# Patient Record
Sex: Female | Born: 1955 | Race: White | Hispanic: No | Marital: Married | Smoking: Never smoker
Health system: Southern US, Community
[De-identification: ages and names within clinical notes are randomized; demographics above are authoritative.]

## PROBLEM LIST (undated history)

## (undated) ENCOUNTER — Ambulatory Visit: Source: Home / Self Care

## (undated) DIAGNOSIS — E785 Hyperlipidemia, unspecified: Secondary | ICD-10-CM

## (undated) DIAGNOSIS — K449 Diaphragmatic hernia without obstruction or gangrene: Secondary | ICD-10-CM

## (undated) DIAGNOSIS — R7303 Prediabetes: Secondary | ICD-10-CM

## (undated) DIAGNOSIS — R002 Palpitations: Secondary | ICD-10-CM

## (undated) DIAGNOSIS — R011 Cardiac murmur, unspecified: Secondary | ICD-10-CM

## (undated) DIAGNOSIS — E669 Obesity, unspecified: Secondary | ICD-10-CM

## (undated) DIAGNOSIS — E559 Vitamin D deficiency, unspecified: Secondary | ICD-10-CM

## (undated) DIAGNOSIS — M2550 Pain in unspecified joint: Secondary | ICD-10-CM

## (undated) DIAGNOSIS — M3500 Sicca syndrome, unspecified: Secondary | ICD-10-CM

## (undated) DIAGNOSIS — E538 Deficiency of other specified B group vitamins: Secondary | ICD-10-CM

## (undated) DIAGNOSIS — M1990 Unspecified osteoarthritis, unspecified site: Secondary | ICD-10-CM

## (undated) DIAGNOSIS — J45909 Unspecified asthma, uncomplicated: Secondary | ICD-10-CM

## (undated) DIAGNOSIS — R12 Heartburn: Secondary | ICD-10-CM

## (undated) DIAGNOSIS — K219 Gastro-esophageal reflux disease without esophagitis: Secondary | ICD-10-CM

## (undated) DIAGNOSIS — I10 Essential (primary) hypertension: Secondary | ICD-10-CM

## (undated) DIAGNOSIS — D649 Anemia, unspecified: Secondary | ICD-10-CM

## (undated) HISTORY — DX: Vitamin D deficiency, unspecified: E55.9

## (undated) HISTORY — DX: Cardiac murmur, unspecified: R01.1

## (undated) HISTORY — PX: INGUINAL HERNIA REPAIR: SUR1180

## (undated) HISTORY — DX: Unspecified osteoarthritis, unspecified site: M19.90

## (undated) HISTORY — DX: Hyperlipidemia, unspecified: E78.5

## (undated) HISTORY — DX: Obesity, unspecified: E66.9

## (undated) HISTORY — DX: Sjogren syndrome, unspecified: M35.00

## (undated) HISTORY — DX: Unspecified asthma, uncomplicated: J45.909

## (undated) HISTORY — PX: UPPER GASTROINTESTINAL ENDOSCOPY: SHX188

## (undated) HISTORY — DX: Deficiency of other specified B group vitamins: E53.8

## (undated) HISTORY — DX: Anemia, unspecified: D64.9

## (undated) HISTORY — DX: Pain in unspecified joint: M25.50

## (undated) HISTORY — DX: Palpitations: R00.2

## (undated) HISTORY — DX: Essential (primary) hypertension: I10

## (undated) HISTORY — DX: Prediabetes: R73.03

## (undated) HISTORY — DX: Diaphragmatic hernia without obstruction or gangrene: K44.9

## (undated) HISTORY — DX: Gastro-esophageal reflux disease without esophagitis: K21.9

## (undated) HISTORY — PX: APPENDECTOMY: SHX54

## (undated) HISTORY — PX: COLONOSCOPY: SHX174

## (undated) HISTORY — DX: Heartburn: R12

---
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
## 1999-06-04 ENCOUNTER — Other Ambulatory Visit: Admission: RE | Admit: 1999-06-04 | Discharge: 1999-06-04 | Payer: Self-pay | Admitting: Obstetrics and Gynecology

## 2000-09-15 ENCOUNTER — Other Ambulatory Visit: Admission: RE | Admit: 2000-09-15 | Discharge: 2000-09-15 | Payer: Self-pay | Admitting: Obstetrics and Gynecology

## 2001-11-17 ENCOUNTER — Other Ambulatory Visit: Admission: RE | Admit: 2001-11-17 | Discharge: 2001-11-17 | Payer: Self-pay | Admitting: Obstetrics and Gynecology

## 2002-12-07 ENCOUNTER — Other Ambulatory Visit: Admission: RE | Admit: 2002-12-07 | Discharge: 2002-12-07 | Payer: Self-pay | Admitting: Obstetrics and Gynecology

## 2002-12-29 ENCOUNTER — Ambulatory Visit (HOSPITAL_COMMUNITY): Admission: RE | Admit: 2002-12-29 | Discharge: 2002-12-29 | Payer: Self-pay | Admitting: Internal Medicine

## 2002-12-29 ENCOUNTER — Encounter: Payer: Self-pay | Admitting: Internal Medicine

## 2004-03-26 ENCOUNTER — Other Ambulatory Visit: Admission: RE | Admit: 2004-03-26 | Discharge: 2004-03-26 | Payer: Self-pay | Admitting: Obstetrics and Gynecology

## 2005-05-27 ENCOUNTER — Other Ambulatory Visit: Admission: RE | Admit: 2005-05-27 | Discharge: 2005-05-27 | Payer: Self-pay | Admitting: Obstetrics and Gynecology

## 2007-09-14 ENCOUNTER — Ambulatory Visit (HOSPITAL_COMMUNITY): Admission: RE | Admit: 2007-09-14 | Discharge: 2007-09-14 | Payer: Self-pay | Admitting: Internal Medicine

## 2011-10-01 ENCOUNTER — Other Ambulatory Visit (HOSPITAL_COMMUNITY): Payer: Self-pay | Admitting: Internal Medicine

## 2011-10-01 ENCOUNTER — Ambulatory Visit (HOSPITAL_COMMUNITY)
Admission: RE | Admit: 2011-10-01 | Discharge: 2011-10-01 | Disposition: A | Discharge status: Home / Self Care | Payer: BC Managed Care – PPO | Source: Ambulatory Visit | Attending: Internal Medicine | Admitting: Internal Medicine

## 2011-10-01 DIAGNOSIS — R0602 Shortness of breath: Secondary | ICD-10-CM

## 2011-10-01 DIAGNOSIS — M79609 Pain in unspecified limb: Secondary | ICD-10-CM

## 2011-10-01 DIAGNOSIS — M542 Cervicalgia: Secondary | ICD-10-CM | POA: Insufficient documentation

## 2011-10-01 DIAGNOSIS — M47812 Spondylosis without myelopathy or radiculopathy, cervical region: Secondary | ICD-10-CM | POA: Insufficient documentation

## 2011-10-01 DIAGNOSIS — R079 Chest pain, unspecified: Secondary | ICD-10-CM | POA: Insufficient documentation

## 2011-10-01 DIAGNOSIS — M25519 Pain in unspecified shoulder: Secondary | ICD-10-CM | POA: Insufficient documentation

## 2011-11-17 ENCOUNTER — Encounter: Payer: Self-pay | Admitting: *Deleted

## 2011-11-17 ENCOUNTER — Ambulatory Visit (INDEPENDENT_AMBULATORY_CARE_PROVIDER_SITE_OTHER): Payer: BC Managed Care – PPO | Admitting: Internal Medicine

## 2011-11-17 VITALS — BP 156/81 | HR 76 | Ht 63.0 in | Wt 223.0 lb

## 2011-11-17 DIAGNOSIS — R011 Cardiac murmur, unspecified: Secondary | ICD-10-CM

## 2011-11-17 NOTE — Progress Notes (Signed)
HPI Patient is a 56 year old who is referred fro new onset murmur. The patient was seen in May in Dr. Kathryne Sharper office.  Murmur (new) was found on exam. The patient denies CP, palpitations, SOB.  She does not exercise regularly.  Allergies  Allergen Reactions  . Lipitor (Atorvastatin)     Current Outpatient Prescriptions  Medication Sig Dispense Refill  . amphetamine-dextroamphetamine (ADDERALL) 20 MG tablet Take 20 mg by mouth daily.       Marland Kitchen aspirin 81 MG tablet Take 81 mg by mouth daily.      Marland Kitchen atenolol (TENORMIN) 25 MG tablet Take 25 mg by mouth daily.      . Cholecalciferol (VITAMIN D-3) 5000 UNITS TABS Take 1 tablet by mouth daily.      . citalopram (CELEXA) 40 MG tablet Take 40 mg by mouth 2 (two) times daily.       . CRESTOR 20 MG tablet Take 10 mg by mouth daily.       . Loratadine (CLARITIN PO) Take by mouth as needed.        Past Medical History  Diagnosis Date  . Hypertension   . Hyperlipidemia   . Anxiety   . Allergic rhinitis   . Murmur   . Obesity     No past surgical history on file.  Family History  Problem Relation Age of Onset  . Hypertension      History   Social History  . Marital Status: Married    Spouse Name: N/A    Number of Children: N/A  . Years of Education: N/A   Occupational History  . Not on file.   Social History Main Topics  . Smoking status: Never Smoker   . Smokeless tobacco: Not on file  . Alcohol Use: No  . Drug Use: No  . Sexually Active: Not on file   Other Topics Concern  . Not on file   Social History Narrative  . No narrative on file    Review of Systems:  All systems reviewed.  They are negative to the above problem except as previously stated.  Vital Signs: BP 156/81  Pulse 76  Ht 5\' 3"  (1.6 m)  Wt 223 lb (101.152 kg)  BMI 39.50 kg/m2  Physical Exam Patient is in NAD HEENT:  Normocephalic, atraumatic. EOMI, PERRLA.  Neck: JVP is normal. No thyromegaly. No bruits.  Lungs: clear to auscultation. No  rales no wheezes.  Heart: Regular rate and rhythm. Normal S1, S2. No S3.   Gr I/VI systolic murmur at base. PMI not displaced.  Abdomen:  Supple, nontender. Normal bowel sounds. No masses. No hepatomegaly.  Extremities:   Good distal pulses throughout. No lower extremity edema.  Musculoskeletal :moving all extremities.  Neuro:   alert and oriented x3.  CN II-XII grossly intact.  EKG:  NSR  76 bpm Assessment and Plan:  1.  Murmur.  Patient has a very subtle murmur at base that is most likely due to flow through LVOT  I do not think it is signif or that it represents signif valve pathology.  May have been more prominent on last exam if she was hyperdynamic F/U as neeeded.  No further testing  Reassured patient  2.  HTN  BP is high today.  She admits to being anxious  BP better in Dr. Kathryne Sharper office.  Follow  3.  HL.  LDL is 117 on Crestor.  Encouraged her to watch diet more and lose wt.  4.  Obesity.  Encouragede her to lose wt.  She is just starting wt watchers.  Encouraged waling  F/u PRN.

## 2011-11-18 ENCOUNTER — Encounter: Payer: Self-pay | Admitting: Internal Medicine

## 2011-11-29 ENCOUNTER — Ambulatory Visit (INDEPENDENT_AMBULATORY_CARE_PROVIDER_SITE_OTHER): Payer: BC Managed Care – PPO | Admitting: Emergency Medicine

## 2011-11-29 VITALS — BP 135/73 | HR 62 | Temp 97.8°F | Resp 16 | Ht 63.0 in | Wt 220.0 lb

## 2011-11-29 DIAGNOSIS — J039 Acute tonsillitis, unspecified: Secondary | ICD-10-CM

## 2011-11-29 MED ORDER — AMOXICILLIN 875 MG PO TABS: 875.0000 mg | ORAL_TABLET | Freq: Two times a day (BID) | ORAL | Status: DC

## 2011-11-29 MED ORDER — AMOXICILLIN 875 MG PO TABS: 875.0000 mg | ORAL_TABLET | Freq: Two times a day (BID) | ORAL | Status: AC

## 2011-11-29 NOTE — Progress Notes (Signed)
    Patient Name: Whitney Herrera Date of Birth: 1955-07-09 Medical Record Number: [id number] Gender: female Date of Encounter: 11/29/2011  Chief Complaint: Sore Throat   History of Present Illness:  Whitney SOTTO is a 56 y.o. very pleasant female patient who presents with the following:  Sore throat with exudate on right tonsil.  No fever or chills, nausea or vomiting.  Moderate post nasal drainage.  Some nonproductive cough   There is no problem list on file for this patient.  Past Medical History  Diagnosis Date  . Hypertension   . Hyperlipidemia   . Anxiety   . Allergic rhinitis   . Murmur   . Obesity    No past surgical history on file. History  Substance Use Topics  . Smoking status: Never Smoker   . Smokeless tobacco: Not on file  . Alcohol Use: No   Family History  Problem Relation Age of Onset  . Hypertension     Allergies  Allergen Reactions  . Lipitor (Atorvastatin)     Medication list has been reviewed and updated.  Current Outpatient Prescriptions on File Prior to Visit  Medication Sig Dispense Refill  . amphetamine-dextroamphetamine (ADDERALL) 20 MG tablet Take 20 mg by mouth daily.       Marland Kitchen aspirin 81 MG tablet Take 81 mg by mouth daily.      Marland Kitchen atenolol (TENORMIN) 25 MG tablet Take 25 mg by mouth daily.      . Cholecalciferol (VITAMIN D-3) 5000 UNITS TABS Take 1 tablet by mouth daily.      . citalopram (CELEXA) 40 MG tablet Take 40 mg by mouth 2 (two) times daily.       . CRESTOR 20 MG tablet Take 10 mg by mouth daily.       . Loratadine (CLARITIN PO) Take by mouth as needed.        Review of Systems:. As per HPI, otherwise negative.    Physical Examination: Filed Vitals:   11/29/11 1338  BP: 135/73  Pulse: 62  Temp: 97.8 F (36.6 C)  Resp: 16   Filed Vitals:   11/29/11 1338  Height: 5\' 3"  (1.6 m)  Weight: 220 lb (99.791 kg)   Body mass index is 38.97 kg/(m^2). Ideal Body Weight: Weight in (lb) to have BMI = 25: 140.8   GEN:  WDWN, NAD, Non-toxic, A & O x 3 HEENT: Atraumatic, Normocephalic. Neck supple. No masses, Right anterior cervical nodes.  Right tonsil covered with purulent smelly exudate both tonsils enlarged and red. Ears and Nose: No external deformity. CV: RRR, No M/G/R. No JVD. No thrill. No extra heart sounds. PULM: CTA B, no wheezes, crackles, rhonchi. No retractions. No resp. distress. No accessory muscle use. ABD: S, NT, ND, +BS. No rebound. No HSM. EXTR: No c/c/e NEURO Normal gait.  PSYCH: Normally interactive. Conversant. Not depressed or anxious appearing.  Calm demeanor.    EKG / Labs / Xrays: None available at time of encounter  Assessment and Plan: Tonsillitis   Carmelina Dane, MD

## 2012-11-10 ENCOUNTER — Other Ambulatory Visit: Payer: Self-pay | Admitting: Obstetrics and Gynecology

## 2012-11-10 DIAGNOSIS — R928 Other abnormal and inconclusive findings on diagnostic imaging of breast: Secondary | ICD-10-CM

## 2012-11-24 ENCOUNTER — Other Ambulatory Visit: Payer: BC Managed Care – PPO

## 2012-11-29 ENCOUNTER — Ambulatory Visit
Admission: RE | Admit: 2012-11-29 | Discharge: 2012-11-29 | Disposition: A | Discharge status: Home / Self Care | Payer: BC Managed Care – PPO | Source: Ambulatory Visit | Attending: Obstetrics and Gynecology | Admitting: Obstetrics and Gynecology

## 2012-11-29 DIAGNOSIS — R928 Other abnormal and inconclusive findings on diagnostic imaging of breast: Secondary | ICD-10-CM

## 2013-04-11 ENCOUNTER — Telehealth: Payer: Self-pay | Admitting: Internal Medicine

## 2013-04-11 NOTE — Telephone Encounter (Signed)
lvom for pt to return call in re to referral.  °

## 2013-04-17 ENCOUNTER — Encounter: Payer: Self-pay | Admitting: Physician Assistant

## 2013-04-17 DIAGNOSIS — E538 Deficiency of other specified B group vitamins: Secondary | ICD-10-CM | POA: Insufficient documentation

## 2013-04-17 DIAGNOSIS — E782 Mixed hyperlipidemia: Secondary | ICD-10-CM | POA: Insufficient documentation

## 2013-04-17 DIAGNOSIS — E785 Hyperlipidemia, unspecified: Secondary | ICD-10-CM | POA: Insufficient documentation

## 2013-04-17 DIAGNOSIS — E559 Vitamin D deficiency, unspecified: Secondary | ICD-10-CM | POA: Insufficient documentation

## 2013-04-17 DIAGNOSIS — I10 Essential (primary) hypertension: Secondary | ICD-10-CM | POA: Insufficient documentation

## 2013-04-20 ENCOUNTER — Ambulatory Visit: Payer: BC Managed Care – PPO | Admitting: Physician Assistant

## 2013-04-20 ENCOUNTER — Encounter: Payer: Self-pay | Admitting: Physician Assistant

## 2013-04-20 VITALS — BP 120/72 | HR 84 | Temp 98.1°F | Resp 16 | Ht 63.75 in | Wt 222.0 lb

## 2013-04-20 DIAGNOSIS — E559 Vitamin D deficiency, unspecified: Secondary | ICD-10-CM

## 2013-04-20 DIAGNOSIS — I10 Essential (primary) hypertension: Secondary | ICD-10-CM

## 2013-04-20 DIAGNOSIS — R7303 Prediabetes: Secondary | ICD-10-CM

## 2013-04-20 DIAGNOSIS — E785 Hyperlipidemia, unspecified: Secondary | ICD-10-CM

## 2013-04-20 DIAGNOSIS — F329 Major depressive disorder, single episode, unspecified: Secondary | ICD-10-CM

## 2013-04-20 DIAGNOSIS — R7309 Other abnormal glucose: Secondary | ICD-10-CM | POA: Insufficient documentation

## 2013-04-20 LAB — HEPATIC FUNCTION PANEL
ALT: 12 U/L (ref 0–35)
AST: 14 U/L (ref 0–37)
Alkaline Phosphatase: 75 U/L (ref 39–117)
Bilirubin, Direct: 0.1 mg/dL (ref 0.0–0.3)
Total Bilirubin: 0.3 mg/dL (ref 0.3–1.2)

## 2013-04-20 LAB — CBC WITH DIFFERENTIAL/PLATELET
Eosinophils Absolute: 0.2 10*3/uL (ref 0.0–0.7)
Eosinophils Relative: 3 % (ref 0–5)
Hemoglobin: 14.4 g/dL (ref 12.0–15.0)
Lymphocytes Relative: 29 % (ref 12–46)
Lymphs Abs: 2.5 10*3/uL (ref 0.7–4.0)
MCH: 29.6 pg (ref 26.0–34.0)
MCV: 88.7 fL (ref 78.0–100.0)
Monocytes Relative: 5 % (ref 3–12)
Neutrophils Relative %: 62 % (ref 43–77)
RBC: 4.86 MIL/uL (ref 3.87–5.11)
WBC: 8.7 10*3/uL (ref 4.0–10.5)

## 2013-04-20 LAB — BASIC METABOLIC PANEL WITH GFR
CO2: 28 mEq/L (ref 19–32)
Calcium: 9.8 mg/dL (ref 8.4–10.5)
Chloride: 103 mEq/L (ref 96–112)
Glucose, Bld: 93 mg/dL (ref 70–99)
Sodium: 141 mEq/L (ref 135–145)

## 2013-04-20 LAB — HEMOGLOBIN A1C
Hgb A1c MFr Bld: 5.9 % — ABNORMAL HIGH (ref <5.7)
Mean Plasma Glucose: 123 mg/dL — ABNORMAL HIGH (ref <117)

## 2013-04-20 LAB — LIPID PANEL
Cholesterol: 162 mg/dL (ref 0–200)
HDL: 48 mg/dL (ref >39)
LDL Cholesterol: 91 mg/dL (ref 0–99)
Triglycerides: 113 mg/dL (ref <150)
VLDL: 23 mg/dL (ref 0–40)

## 2013-04-20 LAB — TSH: TSH: 2.001 u[IU]/mL (ref 0.350–4.500)

## 2013-04-20 MED ORDER — ESCITALOPRAM OXALATE 20 MG PO TABS
20.0000 mg | ORAL_TABLET | Freq: Every day | ORAL | Status: DC
Start: 2013-04-20 — End: 2013-08-29

## 2013-04-20 MED ORDER — AMPHETAMINE-DEXTROAMPHETAMINE 20 MG PO TABS: 20.0000 mg | ORAL_TABLET | Freq: Every day | ORAL | Status: DC

## 2013-04-20 NOTE — Progress Notes (Signed)
HPI Patient presents for 3 month follow up with hypertension, hyperlipidemia, prediabetes and vitamin D. Patient's blood pressure has been controlled at home. Patient denies chest pain, shortness of breath, dizziness.  Patient's cholesterol is diet controlled. In addition they are on crestor 20 and denies myalgias. The cholesterol last visit was  HDL 46, LDL 113. The patient has been working on diet and exercise for prediabetes, and denies changes in vision, polys, and paresthesias.  A1C was 5.5 with an elevated insulin.  Patient states that work and home life has been stressful. Feels she is worthless at work and states that she does not do anything at home. States that she does not do well with Wellbutrin, has taken lexapro in the past.  Patient is on Vitamin D supplement. Vitamin D 61. Seeing Dr. Dierdre Forth for Sjogren's and started Plaquenil.  Current Medications:  Current Outpatient Prescriptions on File Prior to Visit  Medication Sig Dispense Refill  . Cholecalciferol (VITAMIN D-3) 5000 UNITS TABS Take 1 tablet by mouth daily. 4000 IU      . amphetamine-dextroamphetamine (ADDERALL) 20 MG tablet Take 20 mg by mouth daily.       Marland Kitchen aspirin 81 MG tablet Take 81 mg by mouth daily.      Marland Kitchen atenolol (TENORMIN) 25 MG tablet Take 25 mg by mouth daily.      . citalopram (CELEXA) 40 MG tablet Take 40 mg by mouth 2 (two) times daily.       . CRESTOR 20 MG tablet Take 10 mg by mouth daily.       . Loratadine (CLARITIN PO) Take by mouth as needed.       No current facility-administered medications on file prior to visit.   Medical History:  Past Medical History  Diagnosis Date  . Hypertension   . Hyperlipidemia   . Anxiety   . Allergic rhinitis   . Murmur   . Obesity   . B12 deficiency   . Vitamin D deficiency   . Sjogren's disease     Dr. Dierdre Forth  . Prediabetes    Allergies:  Allergies  Allergen Reactions  . Lipitor [Atorvastatin]   . Wellbutrin [Bupropion]     dysphoria     ROS Constitutional: Denies fever, chills, weight loss/gain, headaches, insomnia, fatigue, night sweats, and change in appetite. Eyes: Dry eyes and mouth better with plaquenil. Denies redness, blurred vision, diplopia, discharge, itchy, watery eyes.  ENT: Denies discharge, congestion, post nasal drip, sore throat, earache, dental pain, Tinnitus, Vertigo, Sinus pain, snoring.  Cardio: Denies chest pain, palpitations, irregular heartbeat,  dyspnea, diaphoresis, orthopnea, PND, claudication, edema Respiratory: denies cough, dyspnea,pleurisy, hoarseness, wheezing.  Gastrointestinal: Denies dysphagia, heartburn,  water brash, pain, cramps, nausea, vomiting, bloating, diarrhea, constipation, hematemesis, melena, hematochezia,  hemorrhoids Genitourinary: Denies dysuria, frequency, urgency, nocturia, hesitancy, discharge, hematuria, flank pain Musculoskeletal: Denies arthralgia, myalgia, stiffness, Jt. Swelling, pain, limp, and strain/sprain. Skin: Denies pruritis, rash, hives, warts, acne, eczema, changing in skin lesion Neuro: Weakness, tremor, incoordination, spasms, paresthesia, pain Psychiatric: + stress and anxiety at work denies confusion, memory loss, sensory loss Endocrine: Denies change in weight, skin, hair change, nocturia, and paresthesia, Diabetic Polys, visual blurring, hyper /hypo glycemic episodes.  Heme/Lymph: Excessive bleeding, bruising, enlarged lymph nodes  Family history- Review and unchanged Social history- Review and unchanged Physical Exam: Filed Vitals:   04/20/13 0854  BP: 120/72  Pulse: 84  Temp: 98.1 F (36.7 C)  Resp: 16   Filed Weights   04/20/13 0854  Weight: 222  lb (100.699 kg)   General Appearance: Well nourished, tearful.  Eyes: PERRLA, EOMs, conjunctiva no swelling or erythema, normal fundi and vessels. Sinuses: No Frontal/maxillary tenderness ENT/Mouth: Ext aud canals clear, with TMs without erythema, bulging.No erythema, swelling, or exudate on post  pharynx.  Tonsils not swollen or erythematous. Hearing normal.  Neck: Supple, thyroid normal.  Respiratory: Respiratory effort normal, BS equal bilaterally without rales, rhonchi, wheezing or stridor.  Cardio: Heart sounds normal, regular rate and rhythm without murmurs, rubs or gallops. Peripheral pulses brisk and equal bilaterally, without edema.  Abdomen: obese, soft, with bowel sounds. Non tender, no guarding, rebound, hernias, masses, or organomegaly.  Lymphatics: Non tender without lymphadenopathy.  Musculoskeletal: Full ROM all peripheral extremities, joint stability, 5/5 strength, and normal gait. Skin: Warm, dry without rashes, lesions, ecchymosis.  Neuro: Cranial nerves intact, reflexes equal bilaterally. Normal muscle tone, no cerebellar symptoms. Sensation intact.  Psych: Awake and oriented X 3, normal affect, Insight and Judgment appropriate.   Assessment and Plan:  Hypertension: Continue medication, monitor blood pressure at home. Continue DASH diet. Cholesterol: Continue diet and exercise. Check cholesterol.  Pre-diabetes-Continue diet and exercise. Check A1C Vitamin D Def- check level and continue medications.  Depression/anxiety- stop citalopram and start lexapro 20  ADD- Continue Adderall 20 BID #60 NR may discuss switching to extended release when she comes back in 1 month.  Quentin Mulling 9:10 AM

## 2013-04-21 LAB — INSULIN, FASTING: Insulin fasting, serum: 23 u[IU]/mL (ref 3–28)

## 2013-04-25 ENCOUNTER — Telehealth: Payer: Self-pay | Admitting: Internal Medicine

## 2013-04-25 NOTE — Telephone Encounter (Signed)
Pt called and gve np appt 12/09 @ 1:30 w/Dr. Arbutus Ped Referring Dr. Dierdre Forth Dx-SPEP Elevation Welcome packet mailed.

## 2013-04-25 NOTE — Telephone Encounter (Signed)
C/D 04/25/13 for appt. 05/10/13

## 2013-05-10 ENCOUNTER — Encounter: Payer: Self-pay | Admitting: Internal Medicine

## 2013-05-10 ENCOUNTER — Ambulatory Visit (HOSPITAL_BASED_OUTPATIENT_CLINIC_OR_DEPARTMENT_OTHER): Payer: BC Managed Care – PPO | Admitting: Internal Medicine

## 2013-05-10 ENCOUNTER — Telehealth: Payer: Self-pay | Admitting: Internal Medicine

## 2013-05-10 ENCOUNTER — Other Ambulatory Visit (HOSPITAL_BASED_OUTPATIENT_CLINIC_OR_DEPARTMENT_OTHER): Payer: BC Managed Care – PPO | Admitting: Lab

## 2013-05-10 ENCOUNTER — Ambulatory Visit (HOSPITAL_BASED_OUTPATIENT_CLINIC_OR_DEPARTMENT_OTHER): Payer: BC Managed Care – PPO | Admitting: Lab

## 2013-05-10 ENCOUNTER — Ambulatory Visit: Payer: BC Managed Care – PPO

## 2013-05-10 VITALS — BP 135/49 | HR 76 | Temp 98.7°F | Resp 18 | Ht 63.0 in | Wt 221.5 lb

## 2013-05-10 DIAGNOSIS — D472 Monoclonal gammopathy: Secondary | ICD-10-CM

## 2013-05-10 DIAGNOSIS — I10 Essential (primary) hypertension: Secondary | ICD-10-CM

## 2013-05-10 DIAGNOSIS — M3500 Sicca syndrome, unspecified: Secondary | ICD-10-CM

## 2013-05-10 LAB — CBC WITH DIFFERENTIAL/PLATELET
BASO%: 1 % (ref 0.0–2.0)
Basophils Absolute: 0.1 10*3/uL (ref 0.0–0.1)
EOS%: 4.3 % (ref 0.0–7.0)
HCT: 41.4 % (ref 34.8–46.6)
LYMPH%: 27.8 % (ref 14.0–49.7)
MCH: 29.6 pg (ref 25.1–34.0)
MCHC: 33.1 g/dL (ref 31.5–36.0)
MCV: 89.3 fL (ref 79.5–101.0)
MONO#: 0.6 10*3/uL (ref 0.1–0.9)
MONO%: 8.9 % (ref 0.0–14.0)
NEUT%: 58 % (ref 38.4–76.8)
RDW: 13.6 % (ref 11.2–14.5)
WBC: 6.2 10*3/uL (ref 3.9–10.3)
lymph#: 1.7 10*3/uL (ref 0.9–3.3)

## 2013-05-10 LAB — COMPREHENSIVE METABOLIC PANEL (CC13)
ALT: 18 U/L (ref 0–55)
AST: 17 U/L (ref 5–34)
Alkaline Phosphatase: 73 U/L (ref 40–150)
Anion Gap: 13 mEq/L — ABNORMAL HIGH (ref 3–11)
BUN: 15 mg/dL (ref 7.0–26.0)
CO2: 24 mEq/L (ref 22–29)
Chloride: 105 mEq/L (ref 98–109)
Creatinine: 0.8 mg/dL (ref 0.6–1.1)
Glucose: 87 mg/dl (ref 70–140)
Total Bilirubin: 0.33 mg/dL (ref 0.20–1.20)

## 2013-05-10 NOTE — Patient Instructions (Signed)
Followup visit in 3 weeks for evaluation and discussion of the pending lab results.

## 2013-05-10 NOTE — Telephone Encounter (Signed)
Gave pt appt for MD only, labs will be scheduled later, sent pt to labs today

## 2013-05-10 NOTE — Progress Notes (Signed)
Checked in new pt with no financial concerns. °

## 2013-05-10 NOTE — Progress Notes (Signed)
[facility] [phone]   [phone]  CONSULT NOTE  REFERRING PHYSICIAN: Dr. Alben Deeds  REASON FOR CONSULTATION:  57 years old white female with M spike.  HPI Whitney Herrera is a 57 y.o. female was past medical history significant for hypertension, dyslipidemia, anxiety, obesity, vitamin D deficiency and recently diagnosed Sjogren syndrome. The patient was complaining of dry mouth and she saw her primary care physician Dr. Anne Fu who sent her to Dr. Dierdre Forth for further evaluation. She was diagnosed with Sjogren syndrome. During her evaluation serum protein electrophoreses was performed and it showed M spike of 0.2 G./DL.  Dr. Dierdre Forth referred the patient to me today for further evaluation and recommendation regarding this abnormality. The patient is feeling fine and she has no significant complaints today except for the dry mouth daily. She denied having any history of renal dysfunction or anemia. She has no significant weight loss or night sweats. The patient denied having any chest pain, shortness of breath, cough or hemoptysis. She has no nausea or vomiting. She denied having any bony aches. Her family history significant for Alzheimer's in both mother and father The patient is married and has 2 children. She works in Public librarian. She has no history of smoking or drug abuse but she drinks alcohol occasionally. HPI  Past Medical History  Diagnosis Date  . Hypertension   . Hyperlipidemia   . Anxiety   . Allergic rhinitis   . Murmur   . Obesity   . B12 deficiency   . Vitamin D deficiency   . Sjogren's disease     Dr. Dierdre Forth  . Prediabetes     Past Surgical History  Procedure Laterality Date  . Breast biopsy Right 1975  . Appendectomy    . Inguinal hernia repair Bilateral age 4    Family History  Problem Relation Age of Onset  . Hypertension Mother   . Alzheimer's disease Mother   . Hypertension Father   .  Alzheimer's disease Father     Social History History  Substance Use Topics  . Smoking status: Never Smoker   . Smokeless tobacco: Never Used  . Alcohol Use: No    Allergies  Allergen Reactions  . Lipitor [Atorvastatin]   . Wellbutrin [Bupropion]     dysphoria    Current Outpatient Prescriptions  Medication Sig Dispense Refill  . amphetamine-dextroamphetamine (ADDERALL) 20 MG tablet Take 1 tablet (20 mg total) by mouth daily.  60 tablet  0  . aspirin 81 MG tablet Take 81 mg by mouth daily.      Marland Kitchen atenolol (TENORMIN) 25 MG tablet Take 25 mg by mouth daily.      . cevimeline (EVOXAC) 30 MG capsule Take 30 mg by mouth 3 (three) times daily. PRN      . Cholecalciferol (VITAMIN D-3) 5000 UNITS TABS Take 1 tablet by mouth daily. 4000 IU      . CRESTOR 20 MG tablet Take 10 mg by mouth daily.       Marland Kitchen escitalopram (LEXAPRO) 20 MG tablet Take 1 tablet (20 mg total) by mouth daily.  30 tablet  3  . hydroxychloroquine (PLAQUENIL) 200 MG tablet Take 200 mg by mouth daily.       No current facility-administered medications for this visit.    Review of Systems  Constitutional: negative Eyes: negative Ears, nose, mouth, throat, and face: positive for dry mouth Respiratory: negative Cardiovascular: negative Gastrointestinal: negative Genitourinary:negative Integument/breast: negative Hematologic/lymphatic: negative Musculoskeletal:negative  Neurological: negative Behavioral/Psych: negative Endocrine: negative Allergic/Immunologic: negative  Physical Exam  WUJ:WJXBJ, healthy, no distress, well nourished and well developed SKIN: skin color, texture, turgor are normal, no rashes or significant lesions HEAD: Normocephalic, No masses, lesions, tenderness or abnormalities EYES: normal, PERRLA EARS: External ears normal, Canals clear OROPHARYNX:no exudate, no erythema and lips, buccal mucosa, and tongue normal  NECK: supple, no adenopathy, no JVD LYMPH:  no palpable lymphadenopathy,  no hepatosplenomegaly BREAST:not examined LUNGS: clear to auscultation , and palpation HEART: regular rate & rhythm and no murmurs ABDOMEN:abdomen soft, non-tender, obese, normal bowel sounds and no masses or organomegaly BACK: Back symmetric, no curvature., No CVA tenderness EXTREMITIES:no joint deformities, effusion, or inflammation, no edema, no skin discoloration  NEURO: alert & oriented x 3 with fluent speech, no focal motor/sensory deficits  PERFORMANCE STATUS: ECOG 0  LABORATORY DATA: Lab Results  Component Value Date   WBC 8.7 04/20/2013   HGB 14.4 04/20/2013   HCT 43.1 04/20/2013   MCV 88.7 04/20/2013   PLT 240 04/20/2013      Chemistry      Component Value Date/Time   NA 141 04/20/2013 0916   K 4.3 04/20/2013 0916   CL 103 04/20/2013 0916   CO2 28 04/20/2013 0916   BUN 15 04/20/2013 0916   CREATININE 0.76 04/20/2013 0916      Component Value Date/Time   CALCIUM 9.8 04/20/2013 0916   ALKPHOS 75 04/20/2013 0916   AST 14 04/20/2013 0916   ALT 12 04/20/2013 0916   BILITOT 0.3 04/20/2013 0916       RADIOGRAPHIC STUDIES: No results found.  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 The patient was recently diagnosed wit Sjogren syndrome. The patient edematous but could be related to her rheumatologic disorder but I cannot rule out monoclonal gammopathy or multiple myeloma at this point.  PLAN: I have a lengthy discussion with the patient today about her condition. I ordered several studies to evaluate this abnormality including repeat CBC, comprehensive metabolic panel, LDH and myeloma panel. I will see the patient back for followup visit in 3 weeks for evaluation and discussion of her lab results. If her lab results are suspicious for multiple myeloma, I would consider the patient for a bone marrow biopsy and aspirate in addition to  skeletal bone survey. The patient is currently asymptomatic and she will continue on observation for now. She was advised to call immediately if she has any concerning symptoms in the interval. The patient voices understanding of current disease status and treatment options and is in agreement with the current care plan.  All questions were answered. The patient knows to call the clinic with any problems, questions or concerns. We can certainly see the patient much sooner if necessary.  Thank you so much for allowing me to participate in the care of Faustino Congress. I will continue to follow up the patient with you and assist in her care.  I spent 35 minutes counseling the patient face to face. The total time spent in the appointment was 55 minutes.  Akeelah Seppala K. 05/10/2013, 2:29 PM

## 2013-05-11 ENCOUNTER — Ambulatory Visit (INDEPENDENT_AMBULATORY_CARE_PROVIDER_SITE_OTHER): Payer: BC Managed Care – PPO | Admitting: Physician Assistant

## 2013-05-11 ENCOUNTER — Encounter: Payer: Self-pay | Admitting: Physician Assistant

## 2013-05-11 VITALS — BP 140/88 | HR 76 | Temp 98.4°F | Resp 16 | Wt 219.0 lb

## 2013-05-11 DIAGNOSIS — J029 Acute pharyngitis, unspecified: Secondary | ICD-10-CM

## 2013-05-11 LAB — BETA 2 MICROGLOBULIN, SERUM: Beta-2 Microglobulin: 2.62 mg/L — ABNORMAL HIGH (ref 1.01–1.73)

## 2013-05-11 LAB — IGG, IGA, IGM
IgA: 269 mg/dL (ref 69–380)
IgG (Immunoglobin G), Serum: 916 mg/dL (ref 690–1700)

## 2013-05-11 LAB — KAPPA/LAMBDA LIGHT CHAINS
Kappa free light chain: 1.36 mg/dL (ref 0.33–1.94)
Kappa:Lambda Ratio: 0.96 (ref 0.26–1.65)
Lambda Free Lght Chn: 1.42 mg/dL (ref 0.57–2.63)

## 2013-05-11 MED ORDER — PREDNISONE 20 MG PO TABS: ORAL_TABLET | ORAL | Status: DC

## 2013-05-11 MED ORDER — AZITHROMYCIN 250 MG PO TABS: 250.0000 mg | ORAL_TABLET | Freq: Every day | ORAL | Status: DC

## 2013-05-11 NOTE — Patient Instructions (Signed)

## 2013-05-11 NOTE — Progress Notes (Signed)
   Subjective:    Patient ID: Whitney Herrera, female    DOB: Jul 22, 1955, 57 y.o.   MRN: [id number]  URI  This is a new problem. The current episode started in the past 7 days. The problem has been gradually worsening. There has been no fever. Associated symptoms include congestion, coughing, ear pain, a sore throat, swollen glands and wheezing. Pertinent negatives include no abdominal pain, chest pain, diarrhea, dysuria, headaches, joint pain, joint swelling, nausea, neck pain, plugged ear sensation, rash, rhinorrhea, sinus pain, sneezing or vomiting. She has tried increased fluids, NSAIDs, decongestant and antihistamine for the symptoms. The treatment provided no relief.   Current Outpatient Prescriptions on File Prior to Visit  Medication Sig Dispense Refill  . amphetamine-dextroamphetamine (ADDERALL) 20 MG tablet Take 1 tablet (20 mg total) by mouth daily.  60 tablet  0  . aspirin 81 MG tablet Take 81 mg by mouth daily.      Marland Kitchen atenolol (TENORMIN) 25 MG tablet Take 25 mg by mouth daily.      . cevimeline (EVOXAC) 30 MG capsule Take 30 mg by mouth 3 (three) times daily. PRN      . Cholecalciferol (VITAMIN D-3) 5000 UNITS TABS Take 1 tablet by mouth daily. 4000 IU      . CRESTOR 20 MG tablet Take 10 mg by mouth daily.       Marland Kitchen escitalopram (LEXAPRO) 20 MG tablet Take 1 tablet (20 mg total) by mouth daily.  30 tablet  3  . hydroxychloroquine (PLAQUENIL) 200 MG tablet Take 200 mg by mouth daily.       No current facility-administered medications on file prior to visit.   Past Medical History  Diagnosis Date  . Hypertension   . Hyperlipidemia   . Anxiety   . Allergic rhinitis   . Murmur   . Obesity   . B12 deficiency   . Vitamin D deficiency   . Sjogren's disease     Dr. Dierdre Forth  . Prediabetes     Review of Systems  HENT: Positive for congestion, ear pain and sore throat. Negative for rhinorrhea and sneezing.   Respiratory: Positive for cough and wheezing.   Cardiovascular: Negative  for chest pain.  Gastrointestinal: Negative for nausea, vomiting, abdominal pain and diarrhea.  Genitourinary: Negative for dysuria.  Musculoskeletal: Negative for joint pain and neck pain.  Skin: Negative for rash.  Neurological: Negative for headaches.       Objective:   Physical Exam  Constitutional: She appears well-developed and well-nourished.  HENT:  Head: Normocephalic and atraumatic.  Right Ear: External ear normal.  Left Ear: External ear normal.  Nose: Right sinus exhibits frontal sinus tenderness. Left sinus exhibits frontal sinus tenderness.  Mouth/Throat: Mucous membranes are normal. Posterior oropharyngeal edema and posterior oropharyngeal erythema present. No oropharyngeal exudate or tonsillar abscesses.  Eyes: Conjunctivae and EOM are normal. Pupils are equal, round, and reactive to light.  Neck: Normal range of motion. Neck supple.  Cardiovascular: Normal rate, regular rhythm, normal heart sounds and intact distal pulses.   Pulmonary/Chest: Effort normal and breath sounds normal. No respiratory distress. She has no wheezes.  Abdominal: Soft. Bowel sounds are normal.  Lymphadenopathy:    She has cervical adenopathy.  Skin: Skin is warm and dry.      Assessment & Plan:  Acute pharyngitis - Plan: azithromycin (ZITHROMAX) 250 MG tablet, predniSONE (DELTASONE) 20 MG tablet

## 2013-05-23 ENCOUNTER — Ambulatory Visit: Payer: Self-pay | Admitting: Physician Assistant

## 2013-05-31 ENCOUNTER — Ambulatory Visit (HOSPITAL_BASED_OUTPATIENT_CLINIC_OR_DEPARTMENT_OTHER): Payer: BC Managed Care – PPO | Admitting: Internal Medicine

## 2013-05-31 ENCOUNTER — Encounter: Payer: Self-pay | Admitting: Internal Medicine

## 2013-05-31 VITALS — BP 143/76 | HR 81 | Temp 97.1°F | Resp 18 | Ht 63.0 in | Wt 222.6 lb

## 2013-05-31 DIAGNOSIS — D472 Monoclonal gammopathy: Secondary | ICD-10-CM

## 2013-05-31 NOTE — Progress Notes (Signed)
[facility] [phone]   [phone]  OFFICE PROGRESS NOTE  Nadean Corwin, MD [address] [place] [address]  DIAGNOSIS: Questionable MGUS  PRIOR THERAPY: None  CURRENT THERAPY: None  INTERVAL HISTORY: Whitney Herrera 57 y.o. female returns to the clinic today for followup visit. The patient is feeling fine today with no specific complaints. She denied having any significant chest pain, shortness of breath, cough or hemoptysis. The patient denied having any weight loss or night sweats. She has no nausea or vomiting. She had several studies performed recently for evaluation of monoclonal paraproteinemia. Her lab results showed slightly elevated beta-2 microglobulin 2.62, normal free kappa light chain 1.36, normal free lambda light chain 1.42 with a normal ratio of 0.96. IgG was normal at 916, IgA normal at 262 with low IgM of 22. These findings were nonspecific and not supportive of a diagnosis of MGUS or multiple myeloma.  MEDICAL HISTORY: Past Medical History  Diagnosis Date  . Hypertension   . Hyperlipidemia   . Anxiety   . Allergic rhinitis   . Murmur   . Obesity   . B12 deficiency   . Vitamin D deficiency   . Sjogren's disease     Dr. Dierdre Forth  . Prediabetes     ALLERGIES:  is allergic to lipitor and wellbutrin.  MEDICATIONS:  Current Outpatient Prescriptions  Medication Sig Dispense Refill  . amphetamine-dextroamphetamine (ADDERALL) 20 MG tablet Take 1 tablet (20 mg total) by mouth daily.  60 tablet  0  . aspirin 81 MG tablet Take 81 mg by mouth daily.      Marland Kitchen atenolol (TENORMIN) 25 MG tablet Take 25 mg by mouth daily.      Marland Kitchen azithromycin (ZITHROMAX) 250 MG tablet Take 1 tablet (250 mg total) by mouth daily.  6 each  1  . cevimeline (EVOXAC) 30 MG capsule Take 30 mg by mouth 3 (three) times daily. PRN      . Cholecalciferol (VITAMIN D-3) 5000 UNITS TABS Take 1 tablet by mouth daily. 4000 IU      .  CRESTOR 20 MG tablet Take 10 mg by mouth daily.       Marland Kitchen escitalopram (LEXAPRO) 20 MG tablet Take 1 tablet (20 mg total) by mouth daily.  30 tablet  3  . hydroxychloroquine (PLAQUENIL) 200 MG tablet Take 200 mg by mouth daily.      . predniSONE (DELTASONE) 20 MG tablet Take one pill two times daily for 3 days, take one pill daily for 4 days.  10 tablet  0   No current facility-administered medications for this visit.    SURGICAL HISTORY:  Past Surgical History  Procedure Laterality Date  . Breast biopsy Right 1975  . Appendectomy    . Inguinal hernia repair Bilateral age 81    REVIEW OF SYSTEMS:  A comprehensive review of systems was negative.   PHYSICAL EXAMINATION: General appearance: alert, cooperative and no distress Head: Normocephalic, without obvious abnormality, atraumatic Neck: no adenopathy, no JVD, supple, symmetrical, trachea midline and thyroid not enlarged, symmetric, no tenderness/mass/nodules Lymph nodes: Cervical, supraclavicular, and axillary nodes normal. Resp: clear to auscultation bilaterally Back: symmetric, no curvature. ROM normal. No CVA tenderness. Cardio: regular rate and rhythm, S1, S2 normal, no murmur, click, rub or gallop GI: soft, non-tender; bowel sounds normal; no masses,  no organomegaly Extremities: extremities normal, atraumatic, no cyanosis or edema  ECOG PERFORMANCE STATUS: 0 - Asymptomatic  Blood pressure 143/76,  pulse 81, temperature 97.1 F (36.2 C), temperature source Oral, resp. rate 18, height 5\' 3"  (1.6 m), weight 222 lb 9.6 oz (100.971 kg).  LABORATORY DATA: Lab Results  Component Value Date   WBC 6.2 05/10/2013   HGB 13.7 05/10/2013   HCT 41.4 05/10/2013   MCV 89.3 05/10/2013   PLT 187 05/10/2013      Chemistry      Component Value Date/Time   NA 141 05/10/2013 1441   NA 141 04/20/2013 0916   K 4.1 05/10/2013 1441   K 4.3 04/20/2013 0916   CL 103 04/20/2013 0916   CO2 24 05/10/2013 1441   CO2 28 04/20/2013 0916   BUN 15.0  05/10/2013 1441   BUN 15 04/20/2013 0916   CREATININE 0.8 05/10/2013 1441   CREATININE 0.76 04/20/2013 0916      Component Value Date/Time   CALCIUM 9.9 05/10/2013 1441   CALCIUM 9.8 04/20/2013 0916   ALKPHOS 73 05/10/2013 1441   ALKPHOS 75 04/20/2013 0916   AST 17 05/10/2013 1441   AST 14 04/20/2013 0916   ALT 18 05/10/2013 1441   ALT 12 04/20/2013 0916   BILITOT 0.33 05/10/2013 1441   BILITOT 0.3 04/20/2013 0916       RADIOGRAPHIC STUDIES: No results found.  ASSESSMENT AND PLAN: This is a very pleasant 57 years old white female who presented for evaluation of monoclonal paraproteinemia concerning for MGUS but the myeloma panel performed recently showed no significant findings concerning for MGUS from multiple myeloma. I discussed the lab result with the patient today. I recommended for her to continue routine followup visit and observation by her primary care physician. I would be happy to see the patient in the future if needed. The patient voices understanding of current disease status and treatment options and is in agreement with the current care plan.  All questions were answered. The patient knows to call the clinic with any problems, questions or concerns. We can certainly see the patient much sooner if necessary.

## 2013-06-15 ENCOUNTER — Other Ambulatory Visit: Payer: Self-pay | Admitting: Internal Medicine

## 2013-06-15 MED ORDER — AMPHETAMINE-DEXTROAMPHETAMINE 20 MG PO TABS: 20.0000 mg | ORAL_TABLET | Freq: Three times a day (TID) | ORAL | Status: DC

## 2013-07-19 DIAGNOSIS — Z79899 Other long term (current) drug therapy: Secondary | ICD-10-CM | POA: Insufficient documentation

## 2013-07-19 DIAGNOSIS — Z136 Encounter for screening for cardiovascular disorders: Secondary | ICD-10-CM | POA: Insufficient documentation

## 2013-07-20 ENCOUNTER — Ambulatory Visit (INDEPENDENT_AMBULATORY_CARE_PROVIDER_SITE_OTHER): Payer: BC Managed Care – PPO | Admitting: Internal Medicine

## 2013-07-20 ENCOUNTER — Encounter: Payer: Self-pay | Admitting: Internal Medicine

## 2013-07-20 VITALS — BP 132/84 | HR 72 | Temp 97.9°F | Resp 16 | Wt 224.8 lb

## 2013-07-20 DIAGNOSIS — M722 Plantar fascial fibromatosis: Secondary | ICD-10-CM

## 2013-07-20 DIAGNOSIS — I10 Essential (primary) hypertension: Secondary | ICD-10-CM

## 2013-07-20 DIAGNOSIS — E559 Vitamin D deficiency, unspecified: Secondary | ICD-10-CM

## 2013-07-20 DIAGNOSIS — E785 Hyperlipidemia, unspecified: Secondary | ICD-10-CM

## 2013-07-20 DIAGNOSIS — Z79899 Other long term (current) drug therapy: Secondary | ICD-10-CM

## 2013-07-20 DIAGNOSIS — R7309 Other abnormal glucose: Secondary | ICD-10-CM

## 2013-07-20 DIAGNOSIS — R7303 Prediabetes: Secondary | ICD-10-CM

## 2013-07-20 LAB — CBC WITH DIFFERENTIAL/PLATELET
BASOS PCT: 1 % (ref 0–1)
Basophils Absolute: 0.1 10*3/uL (ref 0.0–0.1)
Eosinophils Absolute: 0.2 10*3/uL (ref 0.0–0.7)
Eosinophils Relative: 3 % (ref 0–5)
HCT: 41.4 % (ref 36.0–46.0)
Hemoglobin: 13.8 g/dL (ref 12.0–15.0)
Lymphocytes Relative: 34 % (ref 12–46)
Lymphs Abs: 2.3 10*3/uL (ref 0.7–4.0)
MCH: 29.1 pg (ref 26.0–34.0)
MCHC: 33.3 g/dL (ref 30.0–36.0)
MCV: 87.3 fL (ref 78.0–100.0)
Monocytes Absolute: 0.4 10*3/uL (ref 0.1–1.0)
Monocytes Relative: 6 % (ref 3–12)
NEUTROS PCT: 56 % (ref 43–77)
Neutro Abs: 3.9 10*3/uL (ref 1.7–7.7)
Platelets: 215 10*3/uL (ref 150–400)
RBC: 4.74 MIL/uL (ref 3.87–5.11)
RDW: 13.6 % (ref 11.5–15.5)
WBC: 6.9 10*3/uL (ref 4.0–10.5)

## 2013-07-20 LAB — HEPATIC FUNCTION PANEL
ALBUMIN: 4.4 g/dL (ref 3.5–5.2)
ALT: 11 U/L (ref 0–35)
AST: 14 U/L (ref 0–37)
Alkaline Phosphatase: 66 U/L (ref 39–117)
BILIRUBIN DIRECT: 0.1 mg/dL (ref 0.0–0.3)
Indirect Bilirubin: 0.3 mg/dL (ref 0.2–1.2)
Total Bilirubin: 0.4 mg/dL (ref 0.2–1.2)
Total Protein: 6.8 g/dL (ref 6.0–8.3)

## 2013-07-20 LAB — LIPID PANEL
CHOL/HDL RATIO: 3.7 ratio
CHOLESTEROL: 183 mg/dL (ref 0–200)
HDL: 50 mg/dL (ref >39)
LDL Cholesterol: 93 mg/dL (ref 0–99)
Triglycerides: 202 mg/dL — ABNORMAL HIGH (ref <150)
VLDL: 40 mg/dL (ref 0–40)

## 2013-07-20 LAB — BASIC METABOLIC PANEL WITH GFR
BUN: 17 mg/dL (ref 6–23)
CALCIUM: 10.1 mg/dL (ref 8.4–10.5)
CO2: 27 mEq/L (ref 19–32)
Chloride: 103 mEq/L (ref 96–112)
Creat: 0.71 mg/dL (ref 0.50–1.10)
GFR, Est Non African American: >89 mL/min
Glucose, Bld: 101 mg/dL — ABNORMAL HIGH (ref 70–99)
Potassium: 4.4 mEq/L (ref 3.5–5.3)
SODIUM: 141 meq/L (ref 135–145)

## 2013-07-20 LAB — MAGNESIUM: MAGNESIUM: 2 mg/dL (ref 1.5–2.5)

## 2013-07-20 LAB — TSH: TSH: 2.315 u[IU]/mL (ref 0.350–4.500)

## 2013-07-20 LAB — HEMOGLOBIN A1C
HEMOGLOBIN A1C: 5.8 % — AB (ref <5.7)
Mean Plasma Glucose: 120 mg/dL — ABNORMAL HIGH (ref <117)

## 2013-07-20 NOTE — Patient Instructions (Signed)

## 2013-07-20 NOTE — Progress Notes (Signed)
Patient ID: Whitney Herrera, female   DOB: June 28, 1955, 58 y.o.   MRN: [id number]    This very nice 58 y.o. MWF presents for 3 month follow up with Hypertension, Hyperlipidemia, Morbid Obesity Pre-Diabetes and Vitamin D Deficiency. She takes Adderall for ADD and continues to feel benefit wit improved alertness and productivity.   HTN predates since 2003. BP has been controlled at home. Today's BP: 132/84 mmHg . Patient denies any cardiac type chest pain, palpitations, dyspnea/orthopnea/PND, dizziness, claudication, or dependent edema.   Hyperlipidemia is controlled with diet & meds. Lab Results  Component Value Date   CHOL 162 04/20/2013   HDL 48 04/20/2013   LDLCALC 91 04/20/2013   TRIG 113 04/20/2013   CHOLHDL 3.4 04/20/2013         Patient denies myalgias or other med SE's.    Also, the patient has history of Morbid Obesity (BMI 40) and PreDiabetes with A1c 6.5% in 2011 with monitoring since and A1c's remaining less than 6.0%. Patient denies any symptoms of reactive hypoglycemia, diabetic polys, paresthesias or visual blurring. She has been counseled extensively on the importance of weight loss.   Further, Patient has history of Vitamin D Deficiency of 20 in 2008 with last vitamin D of  62 in Nov 2014. Patient supplements vitamin D without any suspected side-effects.    Medication List       amphetamine-dextroamphetamine 20 MG tablet  Commonly known as:  ADDERALL  Take 1 tablet (20 mg total) by mouth 3 (three) times daily.     aspirin 81 MG tablet  Take 81 mg by mouth daily.     atenolol 25 MG tablet  Commonly known as:  TENORMIN  Take 25 mg by mouth daily.     cevimeline 30 MG capsule  Commonly known as:  EVOXAC  Take 30 mg by mouth 3 (three) times daily. PRN     CRESTOR 20 MG tablet  Generic drug:  rosuvastatin  Take 10 mg by mouth daily.     escitalopram 20 MG tablet  Commonly known as:  LEXAPRO  Take 1 tablet (20 mg total) by mouth daily.     hydroxychloroquine 200  MG tablet  Commonly known as:  PLAQUENIL  Take 200 mg by mouth daily.     VITAMIN D PO  Take 4,000 Units by mouth daily.         Allergies  Allergen Reactions  . Lipitor [Atorvastatin]   . Wellbutrin [Bupropion]     dysphoria    PMHx:   Past Medical History  Diagnosis Date  . Hypertension   . Hyperlipidemia   . Anxiety   . Allergic rhinitis   . Murmur   . Obesity   . B12 deficiency   . Vitamin D deficiency   . Sjogren's disease     Dr. Amil Amen  . Prediabetes     FHx:    Reviewed / unchanged  SHx:    Reviewed / unchanged  Systems Review: Constitutional: Denies fever, chills, wt changes, headaches, insomnia, fatigue, night sweats, change in appetite. Eyes: Denies redness, blurred vision, diplopia, discharge, itchy, watery eyes.  ENT: Denies discharge, congestion, post nasal drip, epistaxis, sore throat, earache, hearing loss, dental pain, tinnitus, vertigo, sinus pain, snoring.  CV: Denies chest pain, palpitations, irregular heartbeat, syncope, dyspnea, diaphoresis, orthopnea, PND, claudication, edema. Respiratory: denies cough, dyspnea, DOE, pleurisy, hoarseness, laryngitis, wheezing.  Gastrointestinal: Denies dysphagia, odynophagia, heartburn, reflux, water brash, abdominal pain or cramps, nausea, vomiting, bloating, diarrhea, constipation, hematemesis, melena,  hematochezia,  or hemorrhoids. Genitourinary: Denies dysuria, frequency, urgency, nocturia, hesitancy, discharge, hematuria, flank pain. Musculoskeletal: Denies arthralgias, myalgias, stiffness, jt. swelling, pain, limp, strain/sprain.  Skin: Denies pruritus, rash, hives, warts, acne, eczema, change in skin lesion(s). Neuro: No weakness, tremor, incoordination, spasms, paresthesia, or pain. Psychiatric: Denies confusion, memory loss, or sensory loss. Endo: Denies change in weight, skin, hair change.  Heme/Lymph: No excessive bleeding, bruising, orenlarged lymph nodes.  BP: 132/84  Pulse: 72  Temp: 97.9 F  (36.6 C)  Resp: 16    Estimated body mass index is 39.83 kg/(m^2) as calculated from the following:   Height as of 05/31/13: 5\' 3"  (1.6 m).   Weight as of this encounter: 224 lb 12.8 oz (101.969 kg).  On Exam: Appears well nourished - in no distress. Eyes: PERRLA, EOMs, conjunctiva no swelling or erythema. Sinuses: No frontal/maxillary tenderness ENT/Mouth: EAC's clear, TM's nl w/o erythema, bulging. Nares clear w/o erythema, swelling, exudates. Oropharynx clear without erythema or exudates. Oral hygiene is good. Tongue normal, non obstructing. Hearing intact.  Neck: Supple. Thyroid nl. Car 2+/2+ without bruits, nodes or JVD. Chest: Respirations nl with BS clear & equal w/o rales, rhonchi, wheezing or stridor.  Cor: Heart sounds normal w/ regular rate and rhythm without sig. murmurs, gallops, clicks, or rubs. Peripheral pulses normal and equal  without edema.  Abdomen: Soft & bowel sounds normal. Non-tender w/o guarding, rebound, hernias, masses, or organomegaly.  Lymphatics: Unremarkable.  Musculoskeletal: Full ROM all peripheral extremities, joint stability, 5/5 strength, and normal gait.  Skin: Warm, dry without exposed rashes, lesions, ecchymosis apparent.  Neuro: Cranial nerves intact, reflexes equal bilaterally. Sensory-motor testing grossly intact. Tendon reflexes grossly intact.  Pysch: Alert & oriented x 3. Insight and judgement nl & appropriate. No ideations.  Assessment and Plan:  1. Hypertension - Continue monitor blood pressure at home. Continue diet/meds same.  2. Hyperlipidemia - Continue diet/meds, exercise,& lifestyle modifications. Continue monitor periodic cholesterol/liver & renal functions   3. Pre-diabetes - Continue diet, exercise, lifestyle modifications. Monitor appropriate labs.  4. Vitamin D Deficiency - Continue supplementation  5. Morbid Obesity (BMI 40)  Recommended regular exercise, BP monitoring, weight control, and discussed med and SE's.  Recommended labs to assess and monitor clinical status. Further disposition pending results of labs.

## 2013-07-21 LAB — VITAMIN D 25 HYDROXY (VIT D DEFICIENCY, FRACTURES): Vit D, 25-Hydroxy: 52 ng/mL (ref 30–89)

## 2013-07-21 LAB — INSULIN, FASTING: INSULIN FASTING, SERUM: 27 u[IU]/mL (ref 3–28)

## 2013-08-29 ENCOUNTER — Other Ambulatory Visit: Payer: Self-pay | Admitting: Physician Assistant

## 2013-10-03 ENCOUNTER — Other Ambulatory Visit: Payer: Self-pay | Admitting: Internal Medicine

## 2013-10-03 DIAGNOSIS — F988 Other specified behavioral and emotional disorders with onset usually occurring in childhood and adolescence: Secondary | ICD-10-CM

## 2013-10-03 MED ORDER — AMPHETAMINE-DEXTROAMPHETAMINE 20 MG PO TABS: ORAL_TABLET | ORAL | Status: DC

## 2013-10-06 ENCOUNTER — Other Ambulatory Visit: Payer: Self-pay | Admitting: Internal Medicine

## 2013-10-27 ENCOUNTER — Ambulatory Visit: Payer: Self-pay | Admitting: Emergency Medicine

## 2013-11-03 ENCOUNTER — Encounter: Payer: Self-pay | Admitting: Emergency Medicine

## 2013-11-03 ENCOUNTER — Ambulatory Visit (INDEPENDENT_AMBULATORY_CARE_PROVIDER_SITE_OTHER): Payer: BC Managed Care – PPO | Admitting: Emergency Medicine

## 2013-11-03 VITALS — BP 134/86 | HR 72 | Temp 98.2°F | Resp 16 | Ht 63.25 in | Wt 230.0 lb

## 2013-11-03 DIAGNOSIS — I10 Essential (primary) hypertension: Secondary | ICD-10-CM

## 2013-11-03 DIAGNOSIS — R7309 Other abnormal glucose: Secondary | ICD-10-CM

## 2013-11-03 DIAGNOSIS — E782 Mixed hyperlipidemia: Secondary | ICD-10-CM

## 2013-11-03 NOTE — Progress Notes (Signed)
Subjective:    Patient ID: Whitney Herrera, female    DOB: Feb 27, 1956, 58 y.o.   MRN: [id number]  HPI Comments: 58 yo WF presents for 3 month F/U for HTN, Cholesterol, Pre-Dm, D. Deficient. She has tried to increase exercise. She is trying to eat better. She admits to eating too many sweets. She notes she often craves the things she shouldn't eat and eats too much of them. WBC             6.9   07/20/2013 HGB            13.8   07/20/2013 HCT            41.4   07/20/2013 PLT             215   07/20/2013 GLUCOSE         101   07/20/2013 CHOL            183   07/20/2013 TRIG            202   07/20/2013 HDL              50   07/20/2013 LDLCALC          93   07/20/2013 ALT              11   07/20/2013 AST              14   07/20/2013 NA              141   07/20/2013 K               4.4   07/20/2013 CL              103   07/20/2013 CREATININE     0.71   07/20/2013 BUN              17   07/20/2013 CO2              27   07/20/2013 TSH           2.315   07/20/2013 HGBA1C          5.8   07/20/2013  She is still having right foot pain. She saw DR Sharol Given and he x-rayed with DX of calcium deposit and recommended stretches. She is not following directions. She notes she is wearing better shoes but still has mid foot pain.      Medication List       This list is accurate as of: 11/03/13  4:14 PM.  Always use your most recent med list.               amphetamine-dextroamphetamine 20 MG tablet  Commonly known as:  ADDERALL  Take 1/2 to 1 tablet 2 to 3 x Daily for ADD     aspirin 81 MG tablet  Take 81 mg by mouth daily.     atenolol 100 MG tablet  Commonly known as:  TENORMIN  TAKE 1 TABLET BY MOUTH DAILY.     cevimeline 30 MG capsule  Commonly known as:  EVOXAC  Take 30 mg by mouth 3 (three) times daily. PRN     CRESTOR 20 MG tablet  Generic drug:  rosuvastatin  Take 20 mg by mouth daily.     escitalopram 20 MG tablet  Commonly known as:  LEXAPRO  TAKE 1 TABLET BY MOUTH DAILY.     hydroxychloroquine 200 MG  tablet  Commonly known as:  PLAQUENIL  Take 200 mg by mouth daily.     VITAMIN D PO  Take 5,000 Units by mouth daily.       Allergies  Allergen Reactions  . Lipitor [Atorvastatin]   . Wellbutrin [Bupropion]     dysphoria   Past Medical History  Diagnosis Date  . Hypertension   . Hyperlipidemia   . Anxiety   . Allergic rhinitis   . Murmur   . Obesity   . B12 deficiency   . Vitamin D deficiency   . Sjogren's disease     Dr. Amil Amen  . Prediabetes      Review of Systems  Musculoskeletal: Positive for gait problem.       Due to foot pain  All other systems reviewed and are negative.  BP 134/86  Pulse 72  Temp(Src) 98.2 F (36.8 C) (Temporal)  Resp 16  Ht 5' 3.25" (1.607 m)  Wt 230 lb (104.327 kg)  BMI 40.40 kg/m2     Objective:   Physical Exam  Nursing note and vitals reviewed. Constitutional: She is oriented to person, place, and time. She appears well-developed and well-nourished. No distress.  obese  HENT:  Head: Normocephalic and atraumatic.  Right Ear: External ear normal.  Left Ear: External ear normal.  Nose: Nose normal.  Mouth/Throat: Oropharynx is clear and moist.  Eyes: Conjunctivae and EOM are normal.  Neck: Normal range of motion. Neck supple. No JVD present. No thyromegaly present.  Cardiovascular: Normal rate, regular rhythm, normal heart sounds and intact distal pulses.   Pulmonary/Chest: Effort normal and breath sounds normal.  Abdominal: Soft. Bowel sounds are normal. She exhibits no distension. There is no tenderness. There is no rebound.  Musculoskeletal: Normal range of motion. She exhibits tenderness. She exhibits no edema.  Mid foot base 2-3 rd right toes, with + Callus  Lymphadenopathy:    She has no cervical adenopathy.  Neurological: She is alert and oriented to person, place, and time. No cranial nerve deficit.  Skin: Skin is warm and dry. No rash noted. No erythema. No pallor.  Psychiatric: She has a  normal mood and affect. Her behavior is normal. Judgment and thought content normal.          Assessment & Plan:  1.  3 month F/U for HTN, Cholesterol, Pre-Dm, D. Deficient. Needs healthy diet, cardio QD and obtain healthy weight. Check Labs, Check BP if >130/80 call office   2. ? Right foot pain- stretch, ice and if no change f/u Dr Duda  3. ADD ? Binge eating disorder- D/C Adderall and trial of VYVANSE

## 2013-11-03 NOTE — Patient Instructions (Signed)
Binge Eating Disorder Binge eating is uncontrolled eating of large amounts of food. Binge eating occurs in a discrete period of time and may occur to the point of feeling physically uncomfortable. Binge eating that occurs regularly for 6 months or longer is considered binge eating disorder. People with binge eating disorder do not purge after binge eating.  RISK FACTORS More women than men have binge eating disorder. It usually starts during the teenage years or early 44s. There is not one cause of binge eating disorder. A number of risk factors have been identified:  Family history of eating disorders. There may be a genetic component to eating disorders. Eating behaviors and learning how to cope with distress may also be learned.  Low self-esteem.  Overweight at a young age.  Frequent, unsuccessful dieting.  Depression or anxiety.  Difficulty coping with emotions or distress.  History of alcohol or drug abuse. SYMPTOMS Symptoms of binge eating disorder include:  Frequently eating large amounts of food very fast.  Frequently eating a lot more than necessary to feel full.  Feeling a loss of control when eating, such as not being able to stop eating.  Feeling bad about overeating. DIAGNOSIS A diagnosis of binge eating disorder is based on established criteria for the disorder. These criteria are listed as follows:  Binge eating 2 times per week or more for 6 months or longer.  Three or more of the following behaviors:  Eating very fast.  Eating until feeling uncomfortable.  Eating a lot of food when not hungry.  Eating alone to hide eating.  Feeling disgusted, depressed, or ashamed about eating.  Fear that binge eating cannot be stopped. TREATMENT Caregivers who usually treat people with binge eating disorders are mental health professionals, such as psychologists, psychiatrists, and clinical social workers. More than one type of treatment often is used. Treatments may  include:  Psychotherapy.  Cognitive behavioral therapy. This helps you recognize your thoughts, beliefs, and emotions that contribute to unhealthy eating habits. Then it helps you change that habit.  Interpersonal psychotherapy. This shows you how to have better relationships with others. You might learn new ways to communicate.  Dialectical behavioral therapy. This type of treatment helps you learn skills to regulate your emotions and tolerate distress without using binge eating.  Antidepressant medications. These medications affect the levels of certain chemicals in the brain. One type is called selective serotonin reuptake inhibitor. It often helps people with binge eating disorder.  Weight-loss programs. These can be important for binge eaters who weigh too much. Losing excess weight can help prevent other health problems, such as diabetes and heart disease. It can also help you feel better about yourself. SEEK IMMEDIATE MEDICAL CARE IF:   You have frequent nausea or you vomit often.  You have chest pain.  You have trouble breathing.  You think about harming yourself or someone else.  You start vomiting on purpose or using other behaviors (excessive exercise, laxatives) to compensate for binges. FOR MORE INFORMATION: National Eating Disorders Association: www.nationaleatingdisorders.org Document Released: 10/09/2010 Document Revised: 08/11/2011 Document Reviewed: 10/09/2010 [facility] Patient Information 2014 [place], [place]. Morton's Neuroma in Sports  (Interdigital Plantar Neuroma) Morton's neuroma is a condition of the nervous system that results in pain or loss of feeling in the toes. The disease is caused by the bones of the foot squeezing the nerve that runs between two toes (interdigital nerve). The third and fourth toes are most likely to be affected by this disease. SYMPTOMS   Tingling,  numbness, burning, or electric shocks in the front of the foot, often involving the  third and fourth toes, although it may involve any other pair of toes.  Pain and tenderness in the front of the foot, that gets worse when walking.  Pain that gets worse when pressure is applied to the foot (wearing shoes).  Severe pain in the front of the foot, when standing on the front of the foot (on tiptoes), such as with running, jumping, pivoting, or dancing. CAUSES  Morton's neuroma is caused by swelling of the nerve between two toes. This swelling causes the nerve to be pinched between the bones of the foot. RISK INCREASES WITH:  Recurring foot or ankle injuries.  Poor fitting or worn shoes, with minimal padding and shock absorbers.  Loose ligaments of the foot, causing thickening of the nerve.  Poor foot strength and flexibility. PREVENTION  Warm up and stretch properly before activity.  Maintain physical fitness:  Foot and ankle flexibility.  Muscle strength and endurance.  Cardiovascular fitness.  Wear properly fitted and padded shoes.  Wear arch supports (orthotics), when needed. PROGNOSIS  If treated properly, Morton's neuroma can usually be cured with non-surgical treatment. For certain cases, surgery may be needed. RELATED COMPLICATIONS  Permanent numbness and pain in the foot.  Inability to participate in athletics, because of pain. TREATMENT Treatment first involves stopping any activities that make the symptoms worse. The use of ice and medicine will help reduce pain and inflammation. Wearing shoes with a wide toe box, and an orthotic arch support or metatarsal bar, may also reduce pain. Your caregiver may give you a corticosteroid injection, to further reduce inflammation. If non-surgical treatment is unsuccessful, surgery may be needed. Surgery to fix Morton's neuroma is often performed as an outpatient procedure, meaning you can go home the same day as the surgery. The procedure involves removing the source of pressure on the nerve. If it is necessary to  remove the nerve, you can expect persistent numbness. MEDICATION  If pain medicine is needed, nonsteroidal anti-inflammatory medicines (aspirin and ibuprofen), or other minor pain relievers (acetaminophen), are often advised.  Do not take pain medicine for 7 days before surgery.  Prescription pain relievers are usually prescribed only after surgery. Use only as directed and only as much as you need.  Corticosteroid injections are used in extreme cases, to reduce inflammation. These injections should be done only if necessary, because they may be given only a limited number of times. HEAT AND COLD  Cold treatment (icing) should be applied for 10 to 15 minutes every 2 to 3 hours for inflammation and pain, and immediately after activity that aggravates your symptoms. Use ice packs or an ice massage.  Heat treatment may be used before performing stretching and strengthening activities prescribed by your caregiver, physical therapist, or athletic trainer. Use a heat pack or a warm water soak. SEEK MEDICAL CARE IF:   Symptoms get worse or do not improve in 2 weeks, despite treatment.  After surgery you develop increasing pain, swelling, redness, increased warmth, bleeding, drainage of fluids, or fever.  New, unexplained symptoms develop. (Drugs used in treatment may produce side effects.) Document Released: 03/26/2005 Document Revised: 08/11/2011 Document Reviewed: 08/31/2008 [facility] Patient Information 2014 [place], [place].

## 2013-11-04 LAB — HEPATIC FUNCTION PANEL
ALBUMIN: 4.2 g/dL (ref 3.5–5.2)
ALT: 13 U/L (ref 0–35)
AST: 15 U/L (ref 0–37)
Alkaline Phosphatase: 65 U/L (ref 39–117)
BILIRUBIN TOTAL: 0.3 mg/dL (ref 0.2–1.2)
Bilirubin, Direct: 0.1 mg/dL (ref 0.0–0.3)
Indirect Bilirubin: 0.2 mg/dL (ref 0.2–1.2)
Total Protein: 6.8 g/dL (ref 6.0–8.3)

## 2013-11-04 LAB — BASIC METABOLIC PANEL WITH GFR
BUN: 14 mg/dL (ref 6–23)
CHLORIDE: 102 meq/L (ref 96–112)
CO2: 27 mEq/L (ref 19–32)
CREATININE: 0.67 mg/dL (ref 0.50–1.10)
Calcium: 9.4 mg/dL (ref 8.4–10.5)
GFR, Est African American: >89 mL/min
GFR, Est Non African American: >89 mL/min
GLUCOSE: 89 mg/dL (ref 70–99)
Potassium: 3.9 mEq/L (ref 3.5–5.3)
Sodium: 140 mEq/L (ref 135–145)

## 2013-11-04 LAB — HEMOGLOBIN A1C
Hgb A1c MFr Bld: 5.7 % — ABNORMAL HIGH (ref <5.7)
MEAN PLASMA GLUCOSE: 117 mg/dL — AB (ref <117)

## 2013-11-04 LAB — CBC WITH DIFFERENTIAL/PLATELET
Basophils Absolute: 0.1 10*3/uL (ref 0.0–0.1)
Basophils Relative: 1 % (ref 0–1)
EOS PCT: 3 % (ref 0–5)
Eosinophils Absolute: 0.3 10*3/uL (ref 0.0–0.7)
HCT: 40.3 % (ref 36.0–46.0)
Hemoglobin: 13.4 g/dL (ref 12.0–15.0)
LYMPHS ABS: 2.6 10*3/uL (ref 0.7–4.0)
Lymphocytes Relative: 30 % (ref 12–46)
MCH: 29 pg (ref 26.0–34.0)
MCHC: 33.3 g/dL (ref 30.0–36.0)
MCV: 87.2 fL (ref 78.0–100.0)
MONO ABS: 0.5 10*3/uL (ref 0.1–1.0)
Monocytes Relative: 6 % (ref 3–12)
Neutro Abs: 5.2 10*3/uL (ref 1.7–7.7)
Neutrophils Relative %: 60 % (ref 43–77)
Platelets: 212 10*3/uL (ref 150–400)
RBC: 4.62 MIL/uL (ref 3.87–5.11)
RDW: 14 % (ref 11.5–15.5)
WBC: 8.6 10*3/uL (ref 4.0–10.5)

## 2013-11-04 LAB — LIPID PANEL
CHOL/HDL RATIO: 4.4 ratio
CHOLESTEROL: 181 mg/dL (ref 0–200)
HDL: 41 mg/dL (ref >39)
LDL Cholesterol: 91 mg/dL (ref 0–99)
TRIGLYCERIDES: 246 mg/dL — AB (ref <150)
VLDL: 49 mg/dL — ABNORMAL HIGH (ref 0–40)

## 2013-11-04 LAB — INSULIN, FASTING: Insulin fasting, serum: 58 u[IU]/mL — ABNORMAL HIGH (ref 3–28)

## 2013-11-06 ENCOUNTER — Other Ambulatory Visit: Payer: Self-pay | Admitting: Emergency Medicine

## 2013-11-06 MED ORDER — METFORMIN HCL ER 500 MG PO TB24: 500.0000 mg | ORAL_TABLET | Freq: Every day | ORAL | Status: DC

## 2013-12-09 ENCOUNTER — Other Ambulatory Visit: Payer: Self-pay

## 2014-01-24 ENCOUNTER — Encounter: Payer: Self-pay | Admitting: Internal Medicine

## 2014-01-31 ENCOUNTER — Encounter: Payer: Self-pay | Admitting: Internal Medicine

## 2014-01-31 ENCOUNTER — Ambulatory Visit (INDEPENDENT_AMBULATORY_CARE_PROVIDER_SITE_OTHER): Payer: BC Managed Care – PPO | Admitting: Internal Medicine

## 2014-01-31 VITALS — BP 156/84 | HR 76 | Temp 98.6°F | Resp 16 | Ht 63.5 in | Wt 230.0 lb

## 2014-01-31 DIAGNOSIS — R7402 Elevation of levels of lactic acid dehydrogenase (LDH): Secondary | ICD-10-CM

## 2014-01-31 DIAGNOSIS — Z0000 Encounter for general adult medical examination without abnormal findings: Secondary | ICD-10-CM

## 2014-01-31 DIAGNOSIS — E782 Mixed hyperlipidemia: Secondary | ICD-10-CM

## 2014-01-31 DIAGNOSIS — E559 Vitamin D deficiency, unspecified: Secondary | ICD-10-CM

## 2014-01-31 DIAGNOSIS — Z1212 Encounter for screening for malignant neoplasm of rectum: Secondary | ICD-10-CM

## 2014-01-31 DIAGNOSIS — Z111 Encounter for screening for respiratory tuberculosis: Secondary | ICD-10-CM

## 2014-01-31 DIAGNOSIS — Z113 Encounter for screening for infections with a predominantly sexual mode of transmission: Secondary | ICD-10-CM

## 2014-01-31 DIAGNOSIS — R7401 Elevation of levels of liver transaminase levels: Secondary | ICD-10-CM

## 2014-01-31 DIAGNOSIS — I10 Essential (primary) hypertension: Secondary | ICD-10-CM

## 2014-01-31 DIAGNOSIS — R740 Nonspecific elevation of levels of transaminase and lactic acid dehydrogenase [LDH]: Secondary | ICD-10-CM

## 2014-01-31 LAB — CBC WITH DIFFERENTIAL/PLATELET
BASOS ABS: 0.1 10*3/uL (ref 0.0–0.1)
Basophils Relative: 1 % (ref 0–1)
Eosinophils Absolute: 0.3 10*3/uL (ref 0.0–0.7)
Eosinophils Relative: 3 % (ref 0–5)
HCT: 40.4 % (ref 36.0–46.0)
Hemoglobin: 13.4 g/dL (ref 12.0–15.0)
LYMPHS PCT: 29 % (ref 12–46)
Lymphs Abs: 2.5 10*3/uL (ref 0.7–4.0)
MCH: 29.1 pg (ref 26.0–34.0)
MCHC: 33.2 g/dL (ref 30.0–36.0)
MCV: 87.8 fL (ref 78.0–100.0)
Monocytes Absolute: 0.6 10*3/uL (ref 0.1–1.0)
Monocytes Relative: 7 % (ref 3–12)
NEUTROS PCT: 60 % (ref 43–77)
Neutro Abs: 5.1 10*3/uL (ref 1.7–7.7)
Platelets: 212 10*3/uL (ref 150–400)
RBC: 4.6 MIL/uL (ref 3.87–5.11)
RDW: 13.8 % (ref 11.5–15.5)
WBC: 8.5 10*3/uL (ref 4.0–10.5)

## 2014-01-31 NOTE — Patient Instructions (Signed)
Recommend the book "The END of DIETING" by Dr Baker Janus   and the book "The END of DIABETES " by Dr Excell Seltzer  At [email or web address] - get book & Audio CD's      Being diabetic has a  300% increased risk for heart attack, stroke, cancer, and alzheimer- type vascular dementia. It is very important that you work harder with diet by avoiding all foods that are white except chicken & fish. Avoid white rice (brown & wild rice is OK), white potatoes (sweetpotatoes in moderation is OK), White bread or wheat bread or anything made out of white flour like bagels, donuts, rolls, buns, biscuits, cakes, pastries, cookies, pizza crust, and pasta (made from white flour & egg whites) - vegetarian pasta or spinach or wheat pasta is OK. Multigrain breads like Arnold's or [employer], or multigrain sandwich thins or flatbreads.  Diet, exercise and weight loss can reverse and cure diabetes in the early stages.  Diet, exercise and weight loss is very important in the control and prevention of complications of diabetes which affects every system in your body, ie. Brain - dementia/stroke, eyes - glaucoma/blindness, heart - heart attack/heart failure, kidneys - dialysis, stomach - gastric paralysis, intestines - malabsorption, nerves - severe painful neuritis, circulation - gangrene & loss of a leg(s), and finally cancer and Alzheimers.    I recommend avoid fried & greasy foods,  sweets/candy, white rice (brown or wild rice or Quinoa is OK), white potatoes (sweet potatoes are OK) - anything made from white flour - bagels, doughnuts, rolls, buns, biscuits,white and wheat breads, pizza crust and traditional pasta made of white flour & egg white(vegetarian pasta or spinach or wheat pasta is OK).  Multi-grain bread is OK - like multi-grain flat bread or sandwich thins. Avoid alcohol in excess. Exercise is also important.    Eat all the vegetables you want - avoid meat, especially red meat and dairy - especially cheese.  Cheese  is the most concentrated form of trans-fats which is the worst thing to clog up our arteries. Veggie cheese is OK which can be found in the fresh produce section at [employer] or [employer] or [employer]  Preventive Care for Adults A healthy lifestyle and preventive care can promote health and wellness. Preventive health guidelines for women include the following key practices.  A routine yearly physical is a good way to check with your health care provider about your health and preventive screening. It is a chance to share any concerns and updates on your health and to receive a thorough exam.  Visit your dentist for a routine exam and preventive care every 6 months. Brush your teeth twice a day and floss once a day. Good oral hygiene prevents tooth decay and gum disease.  The frequency of eye exams is based on your age, health, family medical history, use of contact lenses, and other factors. Follow your health care provider's recommendations for frequency of eye exams.  Eat a healthy diet. Foods like vegetables, fruits, whole grains, low-fat dairy products, and lean protein foods contain the nutrients you need without too many calories. Decrease your intake of foods high in solid fats, added sugars, and salt. Eat the right amount of calories for you.Get information about a proper diet from your health care provider, if necessary.  Regular physical exercise is one of the most important things you can do for your health. Most adults should get at least 150 minutes of moderate-intensity exercise (any activity that increases  your heart rate and causes you to sweat) each week. In addition, most adults need muscle-strengthening exercises on 2 or more days a week.  Maintain a healthy weight. The body mass index (BMI) is a screening tool to identify possible weight problems. It provides an estimate of body fat based on height and weight. Your health care provider can find your BMI and can help you  achieve or maintain a healthy weight.For adults 20 years and older:  A BMI below 18.5 is considered underweight.  A BMI of 18.5 to 24.9 is normal.  A BMI of 25 to 29.9 is considered overweight.  A BMI of 30 and above is considered obese.  Maintain normal blood lipids and cholesterol levels by exercising and minimizing your intake of saturated fat. Eat a balanced diet with plenty of fruit and vegetables. Blood tests for lipids and cholesterol should begin at age 43 and be repeated every 5 years. If your lipid or cholesterol levels are high, you are over 50, or you are at high risk for heart disease, you may need your cholesterol levels checked more frequently.Ongoing high lipid and cholesterol levels should be treated with medicines if diet and exercise are not working.  If you smoke, find out from your health care provider how to quit. If you do not use tobacco, do not start.  Lung cancer screening is recommended for adults aged 40-80 years who are at high risk for developing lung cancer because of a history of smoking. A yearly low-dose CT scan of the lungs is recommended for people who have at least a 30-pack-year history of smoking and are a current smoker or have quit within the past 15 years. A pack year of smoking is smoking an average of 1 pack of cigarettes a day for 1 year (for example: 1 pack a day for 30 years or 2 packs a day for 15 years). Yearly screening should continue until the smoker has stopped smoking for at least 15 years. Yearly screening should be stopped for people who develop a health problem that would prevent them from having lung cancer treatment.  If you are pregnant, do not drink alcohol. If you are breastfeeding, be very cautious about drinking alcohol. If you are not pregnant and choose to drink alcohol, do not have more than 1 drink per day. One drink is considered to be 12 ounces (355 mL) of beer, 5 ounces (148 mL) of wine, or 1.5 ounces (44 mL) of liquor.  Avoid  use of street drugs. Do not share needles with anyone. Ask for help if you need support or instructions about stopping the use of drugs.  High blood pressure causes heart disease and increases the risk of stroke. Your blood pressure should be checked at least every 1 to 2 years. Ongoing high blood pressure should be treated with medicines if weight loss and exercise do not work.  If you are 74-43 years old, ask your health care provider if you should take aspirin to prevent strokes.  Diabetes screening involves taking a blood sample to check your fasting blood sugar level. This should be done once every 3 years, after age 105, if you are within normal weight and without risk factors for diabetes. Testing should be considered at a younger age or be carried out more frequently if you are overweight and have at least 1 risk factor for diabetes.  Breast cancer screening is essential preventive care for women. You should practice "breast self-awareness." This means understanding the  normal appearance and feel of your breasts and may include breast self-examination. Any changes detected, no matter how small, should be reported to a health care provider. Women in their 77s and 30s should have a clinical breast exam (CBE) by a health care provider as part of a regular health exam every 1 to 3 years. After age 52, women should have a CBE every year. Starting at age 59, women should consider having a mammogram (breast X-ray test) every year. Women who have a family history of breast cancer should talk to their health care provider about genetic screening. Women at a high risk of breast cancer should talk to their health care providers about having an MRI and a mammogram every year.  Breast cancer gene (BRCA)-related cancer risk assessment is recommended for women who have family members with BRCA-related cancers. BRCA-related cancers include breast, ovarian, tubal, and peritoneal cancers. Having family members with  these cancers may be associated with an increased risk for harmful changes (mutations) in the breast cancer genes BRCA1 and BRCA2. Results of the assessment will determine the need for genetic counseling and BRCA1 and BRCA2 testing.  Routine pelvic exams to screen for cancer are no longer recommended for nonpregnant women who are considered low risk for cancer of the pelvic organs (ovaries, uterus, and vagina) and who do not have symptoms. Ask your health care provider if a screening pelvic exam is right for you.  If you have had past treatment for cervical cancer or a condition that could lead to cancer, you need Pap tests and screening for cancer for at least 20 years after your treatment. If Pap tests have been discontinued, your risk factors (such as having a new sexual partner) need to be reassessed to determine if screening should be resumed. Some women have medical problems that increase the chance of getting cervical cancer. In these cases, your health care provider may recommend more frequent screening and Pap tests.  The HPV test is an additional test that may be used for cervical cancer screening. The HPV test looks for the virus that can cause the cell changes on the cervix. The cells collected during the Pap test can be tested for HPV. The HPV test could be used to screen women aged 43 years and older, and should be used in women of any age who have unclear Pap test results. After the age of 50, women should have HPV testing at the same frequency as a Pap test.  Colorectal cancer can be detected and often prevented. Most routine colorectal cancer screening begins at the age of 30 years and continues through age 66 years. However, your health care provider may recommend screening at an earlier age if you have risk factors for colon cancer. On a yearly basis, your health care provider may provide home test kits to check for hidden blood in the stool. Use of a small camera at the end of a tube, to  directly examine the colon (sigmoidoscopy or colonoscopy), can detect the earliest forms of colorectal cancer. Talk to your health care provider about this at age 43, when routine screening begins. Direct exam of the colon should be repeated every 5-10 years through age 56 years, unless early forms of pre-cancerous polyps or small growths are found.  People who are at an increased risk for hepatitis B should be screened for this virus. You are considered at high risk for hepatitis B if:  You were born in a country where hepatitis B occurs  often. Talk with your health care provider about which countries are considered high risk.  Your parents were born in a high-risk country and you have not received a shot to protect against hepatitis B (hepatitis B vaccine).  You have HIV or AIDS.  You use needles to inject street drugs.  You live with, or have sex with, someone who has hepatitis B.  You get hemodialysis treatment.  You take certain medicines for conditions like cancer, organ transplantation, and autoimmune conditions.  Hepatitis C blood testing is recommended for all people born from [birth year] through 1965 and any individual with known risks for hepatitis C.  Practice safe sex. Use condoms and avoid high-risk sexual practices to reduce the spread of sexually transmitted infections (STIs). STIs include gonorrhea, chlamydia, syphilis, trichomonas, herpes, HPV, and human immunodeficiency virus (HIV). Herpes, HIV, and HPV are viral illnesses that have no cure. They can result in disability, cancer, and death.  You should be screened for sexually transmitted illnesses (STIs) including gonorrhea and chlamydia if:  You are sexually active and are younger than 24 years.  You are older than 24 years and your health care provider tells you that you are at risk for this type of infection.  Your sexual activity has changed since you were last screened and you are at an increased risk for chlamydia or  gonorrhea. Ask your health care provider if you are at risk.  If you are at risk of being infected with HIV, it is recommended that you take a prescription medicine daily to prevent HIV infection. This is called preexposure prophylaxis (PrEP). You are considered at risk if:  You are a heterosexual woman, are sexually active, and are at increased risk for HIV infection.  You take drugs by injection.  You are sexually active with a partner who has HIV.  Talk with your health care provider about whether you are at high risk of being infected with HIV. If you choose to begin PrEP, you should first be tested for HIV. You should then be tested every 3 months for as long as you are taking PrEP.  Osteoporosis is a disease in which the bones lose minerals and strength with aging. This can result in serious bone fractures or breaks. The risk of osteoporosis can be identified using a bone density scan. Women ages 35 years and over and women at risk for fractures or osteoporosis should discuss screening with their health care providers. Ask your health care provider whether you should take a calcium supplement or vitamin D to reduce the rate of osteoporosis.  Menopause can be associated with physical symptoms and risks. Hormone replacement therapy is available to decrease symptoms and risks. You should talk to your health care provider about whether hormone replacement therapy is right for you.  Use sunscreen. Apply sunscreen liberally and repeatedly throughout the day. You should seek shade when your shadow is shorter than you. Protect yourself by wearing long sleeves, pants, a wide-brimmed hat, and sunglasses year round, whenever you are outdoors.  Once a month, do a whole body skin exam, using a mirror to look at the skin on your back. Tell your health care provider of new moles, moles that have irregular borders, moles that are larger than a pencil eraser, or moles that have changed in shape or  color.  Stay current with required vaccines (immunizations).  Influenza vaccine. All adults should be immunized every year.  Tetanus, diphtheria, and acellular pertussis (Td, Tdap) vaccine. Pregnant women should receive  1 dose of Tdap vaccine during each pregnancy. The dose should be obtained regardless of the length of time since the last dose. Immunization is preferred during the 27th-36th week of gestation. An adult who has not previously received Tdap or who does not know her vaccine status should receive 1 dose of Tdap. This initial dose should be followed by tetanus and diphtheria toxoids (Td) booster doses every 10 years. Adults with an unknown or incomplete history of completing a 3-dose immunization series with Td-containing vaccines should begin or complete a primary immunization series including a Tdap dose. Adults should receive a Td booster every 10 years.  Varicella vaccine. An adult without evidence of immunity to varicella should receive 2 doses or a second dose if she has previously received 1 dose. Pregnant females who do not have evidence of immunity should receive the first dose after pregnancy. This first dose should be obtained before leaving the health care facility. The second dose should be obtained 4-8 weeks after the first dose.  Human papillomavirus (HPV) vaccine. Females aged 13-26 years who have not received the vaccine previously should obtain the 3-dose series. The vaccine is not recommended for use in pregnant females. However, pregnancy testing is not needed before receiving a dose. If a female is found to be pregnant after receiving a dose, no treatment is needed. In that case, the remaining doses should be delayed until after the pregnancy. Immunization is recommended for any person with an immunocompromised condition through the age of 32 years if she did not get any or all doses earlier. During the 3-dose series, the second dose should be obtained 4-8 weeks after the  first dose. The third dose should be obtained 24 weeks after the first dose and 16 weeks after the second dose.  Zoster vaccine. One dose is recommended for adults aged 26 years or older unless certain conditions are present.  Measles, mumps, and rubella (MMR) vaccine. Adults born before [birth year] generally are considered immune to measles and mumps. Adults born in [birth year] or later should have 1 or more doses of MMR vaccine unless there is a contraindication to the vaccine or there is laboratory evidence of immunity to each of the three diseases. A routine second dose of MMR vaccine should be obtained at least 28 days after the first dose for students attending postsecondary schools, health care workers, or international travelers. People who received inactivated measles vaccine or an unknown type of measles vaccine during 1963-1967 should receive 2 doses of MMR vaccine. People who received inactivated mumps vaccine or an unknown type of mumps vaccine before 1979 and are at high risk for mumps infection should consider immunization with 2 doses of MMR vaccine. For females of childbearing age, rubella immunity should be determined. If there is no evidence of immunity, females who are not pregnant should be vaccinated. If there is no evidence of immunity, females who are pregnant should delay immunization until after pregnancy. Unvaccinated health care workers born before [birth year] who lack laboratory evidence of measles, mumps, or rubella immunity or laboratory confirmation of disease should consider measles and mumps immunization with 2 doses of MMR vaccine or rubella immunization with 1 dose of MMR vaccine.  Pneumococcal 13-valent conjugate (PCV13) vaccine. When indicated, a person who is uncertain of her immunization history and has no record of immunization should receive the PCV13 vaccine. An adult aged 47 years or older who has certain medical conditions and has not been previously immunized should receive 1 dose of  PCV13 vaccine. This PCV13 should be followed with a dose of pneumococcal polysaccharide (PPSV23) vaccine. The PPSV23 vaccine dose should be obtained at least 8 weeks after the dose of PCV13 vaccine. An adult aged 66 years or older who has certain medical conditions and previously received 1 or more doses of PPSV23 vaccine should receive 1 dose of PCV13. The PCV13 vaccine dose should be obtained 1 or more years after the last PPSV23 vaccine dose.  Pneumococcal polysaccharide (PPSV23) vaccine. When PCV13 is also indicated, PCV13 should be obtained first. All adults aged 41 years and older should be immunized. An adult younger than age 20 years who has certain medical conditions should be immunized. Any person who resides in a nursing home or long-term care facility should be immunized. An adult smoker should be immunized. People with an immunocompromised condition and certain other conditions should receive both PCV13 and PPSV23 vaccines. People with human immunodeficiency virus (HIV) infection should be immunized as soon as possible after diagnosis. Immunization during chemotherapy or radiation therapy should be avoided. Routine use of PPSV23 vaccine is not recommended for American Indians, [place] Natives, or people younger than 65 years unless there are medical conditions that require PPSV23 vaccine. When indicated, people who have unknown immunization and have no record of immunization should receive PPSV23 vaccine. One-time revaccination 5 years after the first dose of PPSV23 is recommended for people aged 19-64 years who have chronic kidney failure, nephrotic syndrome, asplenia, or immunocompromised conditions. People who received 1-2 doses of PPSV23 before age 33 years should receive another dose of PPSV23 vaccine at age 19 years or later if at least 5 years have passed since the previous dose. Doses of PPSV23 are not needed for people immunized with PPSV23 at or after age 15 years.  Meningococcal vaccine.  Adults with asplenia or persistent complement component deficiencies should receive 2 doses of quadrivalent meningococcal conjugate (MenACWY-D) vaccine. The doses should be obtained at least 2 months apart. Microbiologists working with certain meningococcal bacteria, [place] [place] recruits, people at risk during an outbreak, and people who travel to or live in countries with a high rate of meningitis should be immunized. A first-year college student up through age 52 years who is living in a residence hall should receive a dose if she did not receive a dose on or after her 16th birthday. Adults who have certain high-risk conditions should receive one or more doses of vaccine.  Hepatitis A vaccine. Adults who wish to be protected from this disease, have certain high-risk conditions, work with hepatitis A-infected animals, work in hepatitis A research labs, or travel to or work in countries with a high rate of hepatitis A should be immunized. Adults who were previously unvaccinated and who anticipate close contact with an international adoptee during the first 60 days after arrival in the [place] from a country with a high rate of hepatitis A should be immunized.  Hepatitis B vaccine. Adults who wish to be protected from this disease, have certain high-risk conditions, may be exposed to blood or other infectious body fluids, are household contacts or sex partners of hepatitis B positive people, are clients or workers in certain care facilities, or travel to or work in countries with a high rate of hepatitis B should be immunized.  Haemophilus influenzae type b (Hib) vaccine. A previously unvaccinated person with asplenia or sickle cell disease or having a scheduled splenectomy should receive 1 dose of Hib vaccine. Regardless of previous immunization, a recipient of a hematopoietic stem  cell transplant should receive a 3-dose series 6-12 months after her successful transplant. Hib vaccine is not recommended for  adults with HIV infection. Preventive Services / Frequency  Blood pressure check.** / Every 1 to 2 years.  Lipid and cholesterol check.** / Every 5 years beginning at age 17 years.  Lung cancer screening. / Every year if you are aged 78-80 years and have a 30-pack-year history of smoking and currently smoke or have quit within the past 15 years. Yearly screening is stopped once you have quit smoking for at least 15 years or develop a health problem that would prevent you from having lung cancer treatment.  Clinical breast exam.** / Every year after age 53 years.  BRCA-related cancer risk assessment.** / For women who have family members with a BRCA-related cancer (breast, ovarian, tubal, or peritoneal cancers).  Mammogram.** / Every year beginning at age 56 years and continuing for as long as you are in good health. Consult with your health care provider.  Pap test.** / Every 3 years starting at age 102 years through age 66 or 86 years with a history of 3 consecutive normal Pap tests.  HPV screening.** / Every 3 years from ages 17 years through ages 59 to 42 years with a history of 3 consecutive normal Pap tests.  Fecal occult blood test (FOBT) of stool. / Every year beginning at age 97 years and continuing until age 53 years. You may not need to do this test if you get a colonoscopy every 10 years.  Flexible sigmoidoscopy or colonoscopy.** / Every 5 years for a flexible sigmoidoscopy or every 10 years for a colonoscopy beginning at age 93 years and continuing until age 2 years.  Hepatitis C blood test.** / For all people born from [birth year] through 1965 and any individual with known risks for hepatitis C.  Skin self-exam. / Monthly.  Influenza vaccine. / Every year.  Tetanus, diphtheria, and acellular pertussis (Tdap/Td) vaccine.** / Consult your health care provider. Pregnant women should receive 1 dose of Tdap vaccine during each pregnancy. 1 dose of Td every 10 years.  Varicella  vaccine.** / Consult your health care provider. Pregnant females who do not have evidence of immunity should receive the first dose after pregnancy.  Zoster vaccine.** / 1 dose for adults aged 73 years or older.  Measles, mumps, rubella (MMR) vaccine.** / You need at least 1 dose of MMR if you were born in 1957 or later. You may also need a 2nd dose. For females of childbearing age, rubella immunity should be determined. If there is no evidence of immunity, females who are not pregnant should be vaccinated. If there is no evidence of immunity, females who are pregnant should delay immunization until after pregnancy.  Pneumococcal 13-valent conjugate (PCV13) vaccine.** / Consult your health care provider.  Pneumococcal polysaccharide (PPSV23) vaccine.** / 1 to 2 doses if you smoke cigarettes or if you have certain conditions.  Meningococcal vaccine.** / Consult your health care provider.  Hepatitis A vaccine.** / Consult your health care provider.  Hepatitis B vaccine.** / Consult your health care provider.  Haemophilus influenzae type b (Hib) vaccine.** / Consult your health care provider.

## 2014-01-31 NOTE — Progress Notes (Signed)
Patient ID: Whitney Herrera, female   DOB: [birth year], 58 y.o.   MRN: [id number]   Annual Screening Comprehensive Examination  This very nice 58 y.o.MWF presents for complete physical.  Patient has been followed for HTN, Prediabetes, Hyperlipidemia, and Vitamin D Deficiency.    HTN predates since 2005. Patient's BP has been controlled at home and patient denies any cardiac symptoms as chest pain, palpitations, shortness of breath, dizziness or ankle swelling. Today's BP: 156/84 mmHg   Patient's hyperlipidemia is controlled to goal with diet and medications. Patient denies myalgias or other medication SE's. Last lipids were Chol 181; HDL  41; LDL  91; Trig 246 on 11/03/2013.   Patient has prediabetes predating since June 2011 with A1c of 5.8% & A1c was  6.0% in Sept 2013. Patient denies reactive hypoglycemic symptoms, visual blurring, diabetic polys, or paresthesias. Last A1c was 5.7% on 11/03/2013.   Finally, patient has history of Vitamin D Deficiency (34 in 2013 & 20 in 2008)  and last Vitamin D was  52 on 07/20/2013.  Medication Sig  .  ADDERALL 20 MG tablet Take 1/2 to 1 tablet 2 to 3 x Daily for ADD  . aspirin 81 MG tablet Take 81 mg  daily.  Marland Kitchen atenolol 100 MG tablet TAKE 1 TAB DAILY.  . cevimeline (EVOXAC) 30 MG capsule Take  3  times daily. PRN  . VITAMIN D  Take 5,000 Units daily.   . CRESTOR 20 MG tablet Take 20 mg  daily.   Marland Kitchen escitalopram (LEXAPRO) 20 MG tablet TAKE 1 TAB DAILY.  . hydroxychloroquine 200 MG tablet Take 200 mg by mouth daily.   Allergies  Allergen Reactions  . Lipitor [Atorvastatin]   . Wellbutrin [Bupropion]     dysphoria   Past Medical History  Diagnosis Date  . Hypertension   . Hyperlipidemia   . Anxiety   . Allergic rhinitis   . Murmur   . Obesity   . B12 deficiency   . Vitamin D deficiency   . Sjogren's disease     Dr. Amil Amen  . Prediabetes    Past Surgical History  Procedure Laterality Date  . Breast biopsy Right 1975  . Appendectomy    .  Inguinal hernia repair Bilateral age 70   Family History  Problem Relation Age of Onset  . Hypertension Mother   . Alzheimer's disease Mother   . Hypertension Father   . Alzheimer's disease Father    History  Substance Use Topics  . Smoking status: Never Smoker   . Smokeless tobacco: Never Used  . Alcohol Use: 1.0 oz/week    2 drink(s) per week    ROS Constitutional: Denies fever, chills, weight loss/gain, headaches, insomnia, fatigue, night sweats, and change in appetite. Eyes: Denies redness, blurred vision, diplopia, discharge, itchy, watery eyes.  ENT: Denies discharge, congestion, post nasal drip, epistaxis, sore throat, earache, hearing loss, dental pain, Tinnitus, Vertigo, Sinus pain, snoring.  Cardio: Denies chest pain, palpitations, irregular heartbeat, syncope, dyspnea, diaphoresis, orthopnea, PND, claudication, edema Respiratory: denies cough, dyspnea, DOE, pleurisy, hoarseness, laryngitis, wheezing.  Gastrointestinal: Denies dysphagia, heartburn, reflux, water brash, pain, cramps, nausea, vomiting, bloating, diarrhea, constipation, hematemesis, melena, hematochezia, jaundice, hemorrhoids Genitourinary: Denies dysuria, frequency, urgency, nocturia, hesitancy, discharge, hematuria, flank pain Breast: Breast lumps, nipple discharge, bleeding.  Musculoskeletal: Denies arthralgia, myalgia, stiffness, Jt. Swelling, pain, limp, and strain/sprain. Denies falls. Skin: Denies puritis, rash, hives, warts, acne, eczema, changing in skin lesion Neuro: No weakness, tremor, incoordination, spasms, paresthesia, pain Psychiatric: Denies  confusion, memory loss, sensory loss. Denies Depression. Endocrine: Denies change in weight, skin, hair change, nocturia, and paresthesia, diabetic polys, visual blurring, hyper / hypo glycemic episodes.  Heme/Lymph: No excessive bleeding, bruising, enlarged lymph nodes.  Physical Exam  BP 156/84  Pulse 76  Temp 98.6 F   Resp 16  Ht 5' 3.5"   Wt 230  lb  BMI 40.10   General Appearance: Well nourished and in no apparent distress. Eyes: PERRLA, EOMs, conjunctiva no swelling or erythema, normal fundi and vessels. Sinuses: No frontal/maxillary tenderness ENT/Mouth: EACs patent / TMs  nl. Nares clear without erythema, swelling, mucoid exudates. Oral hygiene is good. No erythema, swelling, or exudate. Tongue normal, non-obstructing. Tonsils not swollen or erythematous. Hearing normal.  Neck: Supple, thyroid normal. No bruits, nodes or JVD. Respiratory: Respiratory effort normal.  BS equal and clear bilateral without rales, rhonci, wheezing or stridor. Cardio: Heart sounds are normal with regular rate and rhythm and no murmurs, rubs or gallops. Peripheral pulses are normal and equal bilaterally without edema. No aortic or femoral bruits. Chest: symmetric with normal excursions and percussion. Breasts: Symmetric, without lumps, nipple discharge, retractions, or fibrocystic changes.  Abdomen: Flat, soft, with bowl sounds. Nontender, no guarding, rebound, hernias, masses, or organomegaly.  Lymphatics: Non tender without lymphadenopathy.  Musculoskeletal: Full ROM all peripheral extremities, joint stability, 5/5 strength, and normal gait. Skin: Warm and dry without rashes, lesions, cyanosis, clubbing or  ecchymosis.  Neuro: Cranial nerves intact, reflexes equal bilaterally. Normal muscle tone, no cerebellar symptoms. Sensation intact.  Pysch: Awake and oriented X 3, normal affect, Insight and Judgment appropriate.   Assessment and Plan  1. Annual Screening Examination 2. Hypertension  3. Hyperlipidemia 4. Pre Diabetes 5. Vitamin D Deficiency  Continue prudent diet as discussed, weight control, BP monitoring, regular exercise, and medications. Discussed med's effects and SE's. Screening labs and tests as requested with regular follow-up as recommended.

## 2014-02-01 LAB — RPR

## 2014-02-01 LAB — HEMOGLOBIN A1C
Hgb A1c MFr Bld: 5.9 % — ABNORMAL HIGH (ref <5.7)
Mean Plasma Glucose: 123 mg/dL — ABNORMAL HIGH (ref <117)

## 2014-02-01 LAB — VITAMIN B12: VITAMIN B 12: 370 pg/mL (ref 211–911)

## 2014-02-01 LAB — URINALYSIS, MICROSCOPIC ONLY
BACTERIA UA: NONE SEEN
CASTS: NONE SEEN
Crystals: NONE SEEN
Squamous Epithelial / LPF: NONE SEEN

## 2014-02-01 LAB — LIPID PANEL
Cholesterol: 169 mg/dL (ref 0–200)
HDL: 50 mg/dL (ref >39)
LDL Cholesterol: 90 mg/dL (ref 0–99)
Total CHOL/HDL Ratio: 3.4 Ratio
Triglycerides: 144 mg/dL (ref <150)
VLDL: 29 mg/dL (ref 0–40)

## 2014-02-01 LAB — HEPATITIS B CORE ANTIBODY, TOTAL: Hep B Core Total Ab: NONREACTIVE

## 2014-02-01 LAB — BASIC METABOLIC PANEL WITH GFR
BUN: 16 mg/dL (ref 6–23)
CALCIUM: 9.6 mg/dL (ref 8.4–10.5)
CHLORIDE: 105 meq/L (ref 96–112)
CO2: 26 mEq/L (ref 19–32)
Creat: 0.8 mg/dL (ref 0.50–1.10)
GFR, Est African American: >89 mL/min
GFR, Est Non African American: 82 mL/min
Glucose, Bld: 92 mg/dL (ref 70–99)
Potassium: 4 mEq/L (ref 3.5–5.3)
SODIUM: 141 meq/L (ref 135–145)

## 2014-02-01 LAB — HEPATIC FUNCTION PANEL
ALBUMIN: 4.4 g/dL (ref 3.5–5.2)
ALK PHOS: 59 U/L (ref 39–117)
ALT: 14 U/L (ref 0–35)
AST: 17 U/L (ref 0–37)
BILIRUBIN DIRECT: 0.1 mg/dL (ref 0.0–0.3)
BILIRUBIN TOTAL: 0.3 mg/dL (ref 0.2–1.2)
Indirect Bilirubin: 0.2 mg/dL (ref 0.2–1.2)
Total Protein: 7 g/dL (ref 6.0–8.3)

## 2014-02-01 LAB — MICROALBUMIN / CREATININE URINE RATIO
Creatinine, Urine: 38.4 mg/dL
MICROALB UR: 0.5 mg/dL (ref 0.00–1.89)
Microalb Creat Ratio: 13 mg/g (ref 0.0–30.0)

## 2014-02-01 LAB — VITAMIN D 25 HYDROXY (VIT D DEFICIENCY, FRACTURES): VIT D 25 HYDROXY: 51 ng/mL (ref 30–89)

## 2014-02-01 LAB — MAGNESIUM: Magnesium: 2 mg/dL (ref 1.5–2.5)

## 2014-02-01 LAB — TSH: TSH: 2.603 u[IU]/mL (ref 0.350–4.500)

## 2014-02-01 LAB — HIV ANTIBODY (ROUTINE TESTING W REFLEX): HIV 1&2 Ab, 4th Generation: NONREACTIVE

## 2014-02-01 LAB — HEPATITIS B SURFACE ANTIBODY,QUALITATIVE: Hep B S Ab: NEGATIVE

## 2014-02-01 LAB — HEPATITIS C ANTIBODY: HCV AB: NEGATIVE

## 2014-02-01 LAB — HEPATITIS A ANTIBODY, TOTAL: HEP A TOTAL AB: NONREACTIVE

## 2014-02-01 LAB — INSULIN, FASTING: INSULIN FASTING, SERUM: 23.1 u[IU]/mL — AB (ref 2.0–19.6)

## 2014-02-03 LAB — HEPATITIS B E ANTIBODY: Hepatitis Be Antibody: NONREACTIVE

## 2014-02-03 LAB — TB SKIN TEST
Induration: 0 mm
TB Skin Test: NEGATIVE

## 2014-02-04 NOTE — Addendum Note (Signed)
Addended by: Unk Pinto on: 02/04/2014 05:42 PM   Modules accepted: Level of Service

## 2014-03-13 ENCOUNTER — Other Ambulatory Visit: Payer: Self-pay | Admitting: Emergency Medicine

## 2014-03-29 ENCOUNTER — Other Ambulatory Visit: Payer: Self-pay | Admitting: Internal Medicine

## 2014-05-08 ENCOUNTER — Encounter: Payer: Self-pay | Admitting: Physician Assistant

## 2014-05-08 ENCOUNTER — Ambulatory Visit (INDEPENDENT_AMBULATORY_CARE_PROVIDER_SITE_OTHER): Payer: BC Managed Care – PPO | Admitting: Physician Assistant

## 2014-05-08 VITALS — BP 138/78 | HR 68 | Temp 98.1°F | Resp 16 | Ht 63.25 in | Wt 232.0 lb

## 2014-05-08 DIAGNOSIS — R7309 Other abnormal glucose: Secondary | ICD-10-CM

## 2014-05-08 DIAGNOSIS — F988 Other specified behavioral and emotional disorders with onset usually occurring in childhood and adolescence: Secondary | ICD-10-CM

## 2014-05-08 DIAGNOSIS — I10 Essential (primary) hypertension: Secondary | ICD-10-CM

## 2014-05-08 DIAGNOSIS — R197 Diarrhea, unspecified: Secondary | ICD-10-CM

## 2014-05-08 DIAGNOSIS — H9202 Otalgia, left ear: Secondary | ICD-10-CM

## 2014-05-08 DIAGNOSIS — Z79899 Other long term (current) drug therapy: Secondary | ICD-10-CM

## 2014-05-08 DIAGNOSIS — F909 Attention-deficit hyperactivity disorder, unspecified type: Secondary | ICD-10-CM

## 2014-05-08 DIAGNOSIS — E669 Obesity, unspecified: Secondary | ICD-10-CM

## 2014-05-08 DIAGNOSIS — R7303 Prediabetes: Secondary | ICD-10-CM

## 2014-05-08 DIAGNOSIS — M3500 Sicca syndrome, unspecified: Secondary | ICD-10-CM | POA: Insufficient documentation

## 2014-05-08 DIAGNOSIS — E559 Vitamin D deficiency, unspecified: Secondary | ICD-10-CM

## 2014-05-08 DIAGNOSIS — E538 Deficiency of other specified B group vitamins: Secondary | ICD-10-CM

## 2014-05-08 DIAGNOSIS — E785 Hyperlipidemia, unspecified: Secondary | ICD-10-CM

## 2014-05-08 LAB — CBC WITH DIFFERENTIAL/PLATELET
BASOS ABS: 0 10*3/uL (ref 0.0–0.1)
BASOS PCT: 0 % (ref 0–1)
Eosinophils Absolute: 0.3 10*3/uL (ref 0.0–0.7)
Eosinophils Relative: 3 % (ref 0–5)
HCT: 41 % (ref 36.0–46.0)
Hemoglobin: 13.6 g/dL (ref 12.0–15.0)
Lymphocytes Relative: 26 % (ref 12–46)
Lymphs Abs: 2.4 10*3/uL (ref 0.7–4.0)
MCH: 28.7 pg (ref 26.0–34.0)
MCHC: 33.2 g/dL (ref 30.0–36.0)
MCV: 86.5 fL (ref 78.0–100.0)
MPV: 10.4 fL (ref 9.4–12.4)
Monocytes Absolute: 0.6 10*3/uL (ref 0.1–1.0)
Monocytes Relative: 7 % (ref 3–12)
NEUTROS ABS: 5.8 10*3/uL (ref 1.7–7.7)
NEUTROS PCT: 64 % (ref 43–77)
PLATELETS: 236 10*3/uL (ref 150–400)
RBC: 4.74 MIL/uL (ref 3.87–5.11)
RDW: 13.6 % (ref 11.5–15.5)
WBC: 9.1 10*3/uL (ref 4.0–10.5)

## 2014-05-08 MED ORDER — NEOMYCIN-POLYMYXIN-HC 3.5-10000-1 OT SOLN: 3.0000 [drp] | Freq: Four times a day (QID) | OTIC | Status: DC

## 2014-05-08 MED ORDER — AMPHETAMINE-DEXTROAMPHETAMINE 20 MG PO TABS: ORAL_TABLET | ORAL | Status: DC

## 2014-05-08 MED ORDER — HYOSCYAMINE SULFATE 0.125 MG SL SUBL: 0.1250 mg | SUBLINGUAL_TABLET | SUBLINGUAL | Status: DC | PRN

## 2014-05-08 NOTE — Patient Instructions (Signed)
Bad carbs also include fruit juice, alcohol, and sweet tea. These are empty calories that do not signal to your brain that you are full.   Please remember the good carbs are still carbs which convert into sugar. So please measure them out no more than 1/2-1 cup of rice, oatmeal, pasta, and beans  Veggies are however free foods! Pile them on.   Not all fruit is created equal. Please see the list below, the fruit at the bottom is higher in sugars than the fruit at the top. Please avoid all dried fruits.    Dia  Follow up with Dr. Earlean Shawl  Food Choices to Help Relieve Diarrhea When you have diarrhea, the foods you eat and your eating habits are very important. Choosing the right foods and drinks can help relieve diarrhea. Also, because diarrhea can last up to 7 days, you need to replace lost fluids and electrolytes (such as sodium, potassium, and chloride) in order to help prevent dehydration.  WHAT GENERAL GUIDELINES DO I NEED TO FOLLOW?  Slowly drink 1 cup (8 oz) of fluid for each episode of diarrhea. If you are getting enough fluid, your urine will be clear or pale yellow.  Eat starchy foods. Some good choices include white rice, white toast, pasta, low-fiber cereal, baked potatoes (without the skin), saltine crackers, and bagels.  Avoid large servings of any cooked vegetables.  Limit fruit to two servings per day. A serving is  cup or 1 small piece.  Choose foods with less than 2 g of fiber per serving.  Limit fats to less than 8 tsp (38 g) per day.  Avoid fried foods.  Eat foods that have probiotics in them. Probiotics can be found in certain dairy products.  Avoid foods and beverages that may increase the speed at which food moves through the stomach and intestines (gastrointestinal tract). Things to avoid include:  High-fiber foods, such as dried fruit, raw fruits and vegetables, nuts, seeds, and whole grain foods.  Spicy foods and high-fat foods.  Foods and beverages  sweetened with high-fructose corn syrup, honey, or sugar alcohols such as xylitol, sorbitol, and mannitol. WHAT FOODS ARE RECOMMENDED? Grains White rice. White, [place], or pita breads (fresh or toasted), including plain rolls, buns, or bagels. White pasta. Saltine, soda, or graham crackers. Pretzels. Low-fiber cereal. Cooked cereals made with water (such as cornmeal, farina, or cream cereals). Plain muffins. Matzo. Melba toast. Zwieback.  Vegetables Potatoes (without the skin). Strained tomato and vegetable juices. Most well-cooked and canned vegetables without seeds. Tender lettuce. Fruits Cooked or canned applesauce, apricots, cherries, fruit cocktail, grapefruit, peaches, pears, or plums. Fresh bananas, apples without skin, cherries, grapes, cantaloupe, grapefruit, peaches, oranges, or plums.  Meat and Other Protein Products Baked or boiled chicken. Eggs. Tofu. Fish. Seafood. Smooth peanut butter. Ground or well-cooked tender beef, ham, veal, lamb, pork, or poultry.  Dairy Plain yogurt, kefir, and unsweetened liquid yogurt. Lactose-free milk, buttermilk, or soy milk. Plain hard cheese. Beverages Sport drinks. Clear broths. Diluted fruit juices (except prune). Regular, caffeine-free sodas such as ginger ale. Water. Decaffeinated teas. Oral rehydration solutions. Sugar-free beverages not sweetened with sugar alcohols. Other Bouillon, broth, or soups made from recommended foods.  The items listed above may not be a complete list of recommended foods or beverages. Contact your dietitian for more options. WHAT FOODS ARE NOT RECOMMENDED? Grains Whole grain, whole wheat, bran, or rye breads, rolls, pastas, crackers, and cereals. Wild or brown rice. Cereals that contain more than 2  g of fiber per serving. Corn tortillas or taco shells. Cooked or dry oatmeal. Granola. Popcorn. Vegetables Raw vegetables. Cabbage, broccoli, Brussels sprouts, artichokes, baked beans, beet greens, corn, kale, legumes,  peas, sweet potatoes, and yams. Potato skins. Cooked spinach and cabbage. Fruits Dried fruit, including raisins and dates. Raw fruits. Stewed or dried prunes. Fresh apples with skin, apricots, mangoes, pears, raspberries, and strawberries.  Meat and Other Protein Products Chunky peanut butter. Nuts and seeds. Beans and lentils. Berniece Salines.  Dairy High-fat cheeses. Milk, chocolate milk, and beverages made with milk, such as milk shakes. Cream. Ice cream. Sweets and Desserts Sweet rolls, doughnuts, and sweet breads. Pancakes and waffles. Fats and Oils Butter. Cream sauces. Margarine. Salad oils. Plain salad dressings. Olives. Avocados.  Beverages Caffeinated beverages (such as coffee, tea, soda, or energy drinks). Alcoholic beverages. Fruit juices with pulp. Prune juice. Soft drinks sweetened with high-fructose corn syrup or sugar alcohols. Other Coconut. Hot sauce. Chili powder. Mayonnaise. Gravy. Cream-based or milk-based soups.  The items listed above may not be a complete list of foods and beverages to avoid. Contact your dietitian for more information. WHAT SHOULD I DO IF I BECOME DEHYDRATED? Diarrhea can sometimes lead to dehydration. Signs of dehydration include dark urine and dry mouth and skin. If you think you are dehydrated, you should rehydrate with an oral rehydration solution. These solutions can be purchased at pharmacies, retail stores, or online.  Drink -1 cup (120-240 mL) of oral rehydration solution each time you have an episode of diarrhea. If drinking this amount makes your diarrhea worse, try drinking smaller amounts more often. For example, drink 1-3 tsp (5-15 mL) every 5-10 minutes.  A general rule for staying hydrated is to drink 1-2 L of fluid per day. Talk to your health care provider about the specific amount you should be drinking each day. Drink enough fluids to keep your urine clear or pale yellow. Document Released: 08/09/2003 Document Revised: 05/24/2013 Document  Reviewed: 04/11/2013 [facility] Patient Information 2015 [place], [place]. This information is not intended to replace advice given to you by your health care provider. Make sure you discuss any questions you have with your health care provider.

## 2014-05-08 NOTE — Progress Notes (Signed)
Assessment and Plan:  Hypertension: Continue medication, monitor blood pressure at home. Continue DASH diet.  Reminder to go to the ER if any CP, SOB, nausea, dizziness, severe HA, changes vision/speech, left arm numbness and tingling, and jaw pain. Cholesterol: Continue diet and exercise. Check cholesterol.  Pre-diabetes-Continue diet and exercise. Check A1C Vitamin D Def- check level and continue medications.  Diarrhea, benign AB- BRAT diet, levsin, if worse call office/f/u kaplan  Continue diet and meds as discussed. Further disposition pending results of labs.  HPI 58 y.o. female  presents for 3 month follow up with hypertension, hyperlipidemia, prediabetes and vitamin D. Her blood pressure has been controlled at home, today their BP is BP: 138/78 mmHg She does not workout. She denies chest pain, shortness of breath, dizziness.  She is on cholesterol medication, crestor 20mg  QODand denies myalgias. Her cholesterol is at goal. The cholesterol last visit was:   Lab Results  Component Value Date   CHOL 169 01/31/2014   HDL 50 01/31/2014   LDLCALC 90 01/31/2014   TRIG 144 01/31/2014   CHOLHDL 3.4 01/31/2014   She has been working on diet and exercise for prediabetes, and denies paresthesia of the feet, polydipsia, polyuria and visual disturbances. Last A1C in the office was:  Lab Results  Component Value Date   HGBA1C 5.9* 01/31/2014   Patient is on Vitamin D supplement.   Lab Results  Component Value Date   VD25OH 51 01/31/2014     Going to [place] to see her daughter for Christmas for first time.  She is on plaquenil for sjogren's which helps, sees Dr. Amil Amen. She is on adderall for ADD/memory, takes once daily in the AM. She is on lexapro 20mg  which helps.  Some diarrhea, with food and AB pain, over due for colonoscopy was due 2014 with Dr. Earlean Shawl.  Some left ear pain, no other symptoms.  BMI is Body mass index is 40.75 kg/(m^2)., she is working on diet and exercise. Wt  Readings from Last 3 Encounters:  05/08/14 232 lb (105.235 kg)  01/31/14 230 lb (104.327 kg)  11/03/13 230 lb (104.327 kg)    Current Medications:  Current Outpatient Prescriptions on File Prior to Visit  Medication Sig Dispense Refill  . amphetamine-dextroamphetamine (ADDERALL) 20 MG tablet Take 1/2 to 1 tablet 2 to 3 x Daily for ADD 90 tablet 0  . aspirin 81 MG tablet Take 81 mg by mouth daily.    Marland Kitchen atenolol (TENORMIN) 100 MG tablet TAKE 1 TABLET BY MOUTH DAILY. 90 tablet 3  . cevimeline (EVOXAC) 30 MG capsule Take 30 mg by mouth 3 (three) times daily. PRN    . Cholecalciferol (VITAMIN D PO) Take 5,000 Units by mouth daily.     . CRESTOR 20 MG tablet TAKE 1 TABLET BY MOUTH DAILY FOR CHOLESTEROL. 30 tablet PRN  . escitalopram (LEXAPRO) 20 MG tablet TAKE 1 TABLET BY MOUTH DAILY. 90 tablet 1  . hydroxychloroquine (PLAQUENIL) 200 MG tablet Take 200 mg by mouth daily.     No current facility-administered medications on file prior to visit.   Medical History:  Past Medical History  Diagnosis Date  . Hypertension   . Hyperlipidemia   . Anxiety   . Allergic rhinitis   . Murmur   . Obesity   . B12 deficiency   . Vitamin D deficiency   . Sjogren's disease     Dr. Amil Amen  . Prediabetes    Allergies:  Allergies  Allergen Reactions  . Lipitor [  Atorvastatin]   . Wellbutrin [Bupropion]     dysphoria    Review of Systems:  Review of Systems  Constitutional: Positive for malaise/fatigue. Negative for fever, chills, weight loss and diaphoresis.  HENT: Positive for ear pain (left ear). Negative for congestion, ear discharge, hearing loss, nosebleeds, sore throat and tinnitus.   Eyes: Negative.   Respiratory: Negative.  Negative for stridor.   Cardiovascular: Negative.   Gastrointestinal: Positive for abdominal pain and diarrhea (after food, with some AB pain, urgency). Negative for heartburn, nausea, vomiting, constipation, blood in stool and melena.  Genitourinary: Negative.    Musculoskeletal: Negative.   Skin: Negative.   Neurological: Negative.  Negative for weakness and headaches.  Psychiatric/Behavioral: Positive for memory loss (mental fogging). Negative for depression, suicidal ideas, hallucinations and substance abuse. The patient is not nervous/anxious and does not have insomnia.     Family history- Review and unchanged Social history- Review and unchanged Physical Exam: BP 138/78 mmHg  Pulse 68  Temp(Src) 98.1 F (36.7 C)  Resp 16  Ht 5' 3.25" (1.607 m)  Wt 232 lb (105.235 kg)  BMI 40.75 kg/m2 Wt Readings from Last 3 Encounters:  05/08/14 232 lb (105.235 kg)  01/31/14 230 lb (104.327 kg)  11/03/13 230 lb (104.327 kg)   General Appearance: Well nourished, in no apparent distress. Eyes: PERRLA, EOMs, conjunctiva no swelling or erythema Sinuses: No Frontal/maxillary tenderness ENT/Mouth: Ext aud canals clear, TMs without erythema, bulging. No erythema, swelling, or exudate on post pharynx.  Tonsils not swollen or erythematous. Hearing normal.  Neck: Supple, thyroid normal.  Respiratory: Respiratory effort normal, BS equal bilaterally without rales, rhonchi, wheezing or stridor.  Cardio: RRR with no MRGs. Brisk peripheral pulses without edema.  Abdomen: Soft, + BS, obese  Non tender, no guarding, rebound, hernias, masses. Lymphatics: Non tender without lymphadenopathy.  Musculoskeletal: Full ROM, 5/5 strength, normal gait.  Skin: Warm, dry without rashes, lesions, ecchymosis.  Neuro: Cranial nerves intact. Normal muscle tone, no cerebellar symptoms. Sensation intact.  Psych: Awake and oriented X 3, normal affect, Insight and Judgment appropriate.    Vicie Mutters, PA-C 4:06 PM [facility]

## 2014-05-09 LAB — HEMOGLOBIN A1C
Hgb A1c MFr Bld: 5.6 % (ref <5.7)
Mean Plasma Glucose: 114 mg/dL (ref <117)

## 2014-05-09 LAB — HEPATIC FUNCTION PANEL
ALT: 16 U/L (ref 0–35)
AST: 18 U/L (ref 0–37)
Albumin: 4.4 g/dL (ref 3.5–5.2)
Alkaline Phosphatase: 66 U/L (ref 39–117)
BILIRUBIN INDIRECT: 0.2 mg/dL (ref 0.2–1.2)
Bilirubin, Direct: 0.1 mg/dL (ref 0.0–0.3)
TOTAL PROTEIN: 6.9 g/dL (ref 6.0–8.3)
Total Bilirubin: 0.3 mg/dL (ref 0.2–1.2)

## 2014-05-09 LAB — BASIC METABOLIC PANEL WITHOUT GFR
BUN: 18 mg/dL (ref 6–23)
CO2: 28 meq/L (ref 19–32)
Calcium: 9.7 mg/dL (ref 8.4–10.5)
Chloride: 102 meq/L (ref 96–112)
Creat: 0.76 mg/dL (ref 0.50–1.10)
GFR, Est African American: >89 mL/min
GFR, Est Non African American: 87 mL/min
Glucose, Bld: 93 mg/dL (ref 70–99)
Potassium: 4.1 meq/L (ref 3.5–5.3)
Sodium: 141 meq/L (ref 135–145)

## 2014-05-09 LAB — VITAMIN D 25 HYDROXY (VIT D DEFICIENCY, FRACTURES): VIT D 25 HYDROXY: 39 ng/mL (ref 30–100)

## 2014-05-09 LAB — MAGNESIUM: Magnesium: 1.8 mg/dL (ref 1.5–2.5)

## 2014-05-09 LAB — TSH: TSH: 1.794 u[IU]/mL (ref 0.350–4.500)

## 2014-05-09 LAB — LIPID PANEL
Cholesterol: 194 mg/dL (ref 0–200)
HDL: 47 mg/dL (ref >39)
LDL Cholesterol: 104 mg/dL — ABNORMAL HIGH (ref 0–99)
TRIGLYCERIDES: 216 mg/dL — AB (ref <150)
Total CHOL/HDL Ratio: 4.1 Ratio
VLDL: 43 mg/dL — ABNORMAL HIGH (ref 0–40)

## 2014-05-09 LAB — VITAMIN B12: VITAMIN B 12: 282 pg/mL (ref 211–911)

## 2014-08-21 ENCOUNTER — Ambulatory Visit: Payer: Self-pay | Admitting: Internal Medicine

## 2014-08-29 ENCOUNTER — Ambulatory Visit (INDEPENDENT_AMBULATORY_CARE_PROVIDER_SITE_OTHER): Payer: BC Managed Care – PPO | Admitting: Internal Medicine

## 2014-08-29 ENCOUNTER — Encounter: Payer: Self-pay | Admitting: Internal Medicine

## 2014-08-29 VITALS — BP 126/84 | HR 60 | Temp 97.2°F | Resp 18 | Ht 63.25 in | Wt 224.2 lb

## 2014-08-29 DIAGNOSIS — E559 Vitamin D deficiency, unspecified: Secondary | ICD-10-CM

## 2014-08-29 DIAGNOSIS — R7309 Other abnormal glucose: Secondary | ICD-10-CM

## 2014-08-29 DIAGNOSIS — I10 Essential (primary) hypertension: Secondary | ICD-10-CM

## 2014-08-29 DIAGNOSIS — R7303 Prediabetes: Secondary | ICD-10-CM

## 2014-08-29 DIAGNOSIS — Z79899 Other long term (current) drug therapy: Secondary | ICD-10-CM

## 2014-08-29 DIAGNOSIS — S61219A Laceration without foreign body of unspecified finger without damage to nail, initial encounter: Secondary | ICD-10-CM

## 2014-08-29 DIAGNOSIS — E785 Hyperlipidemia, unspecified: Secondary | ICD-10-CM

## 2014-08-29 LAB — CBC WITH DIFFERENTIAL/PLATELET
Basophils Absolute: 0.1 10*3/uL (ref 0.0–0.1)
Basophils Relative: 1 % (ref 0–1)
Eosinophils Absolute: 0.1 10*3/uL (ref 0.0–0.7)
Eosinophils Relative: 2 % (ref 0–5)
HCT: 44.1 % (ref 36.0–46.0)
HEMOGLOBIN: 14.5 g/dL (ref 12.0–15.0)
LYMPHS PCT: 32 % (ref 12–46)
Lymphs Abs: 1.9 10*3/uL (ref 0.7–4.0)
MCH: 29.2 pg (ref 26.0–34.0)
MCHC: 32.9 g/dL (ref 30.0–36.0)
MCV: 88.9 fL (ref 78.0–100.0)
MONO ABS: 0.4 10*3/uL (ref 0.1–1.0)
MPV: 11 fL (ref 8.6–12.4)
Monocytes Relative: 6 % (ref 3–12)
NEUTROS PCT: 59 % (ref 43–77)
Neutro Abs: 3.5 10*3/uL (ref 1.7–7.7)
Platelets: 190 10*3/uL (ref 150–400)
RBC: 4.96 MIL/uL (ref 3.87–5.11)
RDW: 14.1 % (ref 11.5–15.5)
WBC: 6 10*3/uL (ref 4.0–10.5)

## 2014-08-29 LAB — BASIC METABOLIC PANEL WITH GFR
BUN: 14 mg/dL (ref 6–23)
CO2: 29 mEq/L (ref 19–32)
Calcium: 9.8 mg/dL (ref 8.4–10.5)
Chloride: 102 mEq/L (ref 96–112)
Creat: 0.7 mg/dL (ref 0.50–1.10)
GFR, Est Non African American: >89 mL/min
Glucose, Bld: 88 mg/dL (ref 70–99)
Potassium: 4 mEq/L (ref 3.5–5.3)
SODIUM: 141 meq/L (ref 135–145)

## 2014-08-29 LAB — LIPID PANEL
Cholesterol: 189 mg/dL (ref 0–200)
HDL: 42 mg/dL — ABNORMAL LOW (ref >=46)
LDL Cholesterol: 131 mg/dL — ABNORMAL HIGH (ref 0–99)
Total CHOL/HDL Ratio: 4.5 Ratio
Triglycerides: 78 mg/dL (ref <150)
VLDL: 16 mg/dL (ref 0–40)

## 2014-08-29 LAB — HEPATIC FUNCTION PANEL
ALBUMIN: 4 g/dL (ref 3.5–5.2)
ALT: 16 U/L (ref 0–35)
AST: 18 U/L (ref 0–37)
Alkaline Phosphatase: 50 U/L (ref 39–117)
Bilirubin, Direct: 0.1 mg/dL (ref 0.0–0.3)
Indirect Bilirubin: 0.3 mg/dL (ref 0.2–1.2)
Total Bilirubin: 0.4 mg/dL (ref 0.2–1.2)
Total Protein: 6.6 g/dL (ref 6.0–8.3)

## 2014-08-29 LAB — MAGNESIUM: Magnesium: 2.1 mg/dL (ref 1.5–2.5)

## 2014-08-29 MED ORDER — AMOXICILLIN-POT CLAVULANATE 875-125 MG PO TABS: 1.0000 | ORAL_TABLET | Freq: Two times a day (BID) | ORAL | Status: DC

## 2014-08-29 NOTE — Progress Notes (Signed)
Patient ID: Whitney Herrera, female   DOB: March 10, 1956, 59 y.o.   MRN: [id number]  Assessment and Plan:  Hypertension:  -Continue medication,  -monitor blood pressure at home.  -Continue DASH diet.   -Reminder to go to the ER if any CP, SOB, nausea, dizziness, severe HA, changes vision/speech, left arm numbness and tingling, and jaw pain.  Cholesterol: -Continue diet and exercise.  -Check cholesterol.   Pre-diabetes: -Continue diet and exercise.  -Check A1C  Vitamin D Def: -check level -continue medications.   Hand Wound -Augmentin BID x 7 days.  Will cover for possible cat bite.  Continue wound care  Continue diet and meds as discussed. Further disposition pending results of labs.  HPI 59 y.o. female  presents for 3 month follow up with hypertension, hyperlipidemia, prediabetes and vitamin D.   Her blood pressure has been controlled at home, today their BP is BP: 126/84 mmHg.   She does workout. She denies chest pain, shortness of breath, dizziness.   She is on cholesterol medication and denies myalgias. Her cholesterol is not at goal. The cholesterol last visit was:   Lab Results  Component Value Date   CHOL 194 05/08/2014   HDL 47 05/08/2014   LDLCALC 104* 05/08/2014   TRIG 216* 05/08/2014   CHOLHDL 4.1 05/08/2014     She has been working on diet and exercise for prediabetes, and denies foot ulcerations, hyperglycemia, hypoglycemia , increased appetite, nausea, paresthesia of the feet, polydipsia, polyuria, visual disturbances, vomiting and weight loss. Last A1C in the office was:  Lab Results  Component Value Date   HGBA1C 5.6 05/08/2014    Patient is on Vitamin D supplement.  Lab Results  Component Value Date   VD25OH 39 05/08/2014     Patient does complain of 1 week of swelling and soreness to her left pinky.  She thinks that she was either bit or scratched by her cat.  She has noticed it has been red and swollen.  She has had some minimal purulent drainage.  She  has been using neosporin on it.   Current Medications:  Current Outpatient Prescriptions on File Prior to Visit  Medication Sig Dispense Refill  . atenolol (TENORMIN) 100 MG tablet TAKE 1 TABLET BY MOUTH DAILY. 90 tablet 3  . cevimeline (EVOXAC) 30 MG capsule Take 30 mg by mouth 3 (three) times daily. PRN    . Cholecalciferol (VITAMIN D PO) Take 5,000 Units by mouth daily.     . CRESTOR 20 MG tablet TAKE 1 TABLET BY MOUTH DAILY FOR CHOLESTEROL. 30 tablet PRN  . escitalopram (LEXAPRO) 20 MG tablet TAKE 1 TABLET BY MOUTH DAILY. 90 tablet 1  . hydroxychloroquine (PLAQUENIL) 200 MG tablet Take 200 mg by mouth daily.    . hyoscyamine (LEVSIN/SL) 0.125 MG SL tablet Place 1 tablet (0.125 mg total) under the tongue every 4 (four) hours as needed for cramping (nausea, diarrhea). 50 tablet 1  . neomycin-polymyxin-hydrocortisone (CORTISPORIN) otic solution Place 3 drops into the left ear 4 (four) times daily. 10 mL 3  . amphetamine-dextroamphetamine (ADDERALL) 20 MG tablet Take 1/2 to 1 tablet 2 to 3 x Daily for ADD (Patient not taking: Reported on 08/29/2014) 90 tablet 0  . aspirin 81 MG tablet Take 81 mg by mouth daily.     No current facility-administered medications on file prior to visit.    Medical History:  Past Medical History  Diagnosis Date  . Hypertension   . Hyperlipidemia   . Anxiety   .  Allergic rhinitis   . Murmur   . Obesity   . B12 deficiency   . Vitamin D deficiency   . Sjogren's disease     Dr. Amil Amen  . Prediabetes     Allergies:  Allergies  Allergen Reactions  . Lipitor [Atorvastatin]   . Wellbutrin [Bupropion]     dysphoria     Review of Systems:  Review of Systems  Constitutional: Negative for fever, chills and malaise/fatigue.  HENT: Negative for congestion, ear discharge, ear pain, sore throat and tinnitus.   Eyes: Negative.   Respiratory: Negative for cough, sputum production and shortness of breath.   Cardiovascular: Negative for chest pain,  palpitations and leg swelling.  Gastrointestinal: Negative for heartburn, nausea, vomiting, diarrhea, constipation, blood in stool and melena.  Genitourinary: Negative for dysuria, urgency, frequency, hematuria and flank pain.  Musculoskeletal: Negative.   Skin: Negative.   Neurological: Negative.  Negative for headaches.    Family history- Review and unchanged  Social history- Review and unchanged  Physical Exam: BP 126/84 mmHg  Pulse 60  Temp(Src) 97.2 F (36.2 C)  Resp 18  Ht 5' 3.25" (1.607 m)  Wt 224 lb 3.2 oz (101.696 kg)  BMI 39.38 kg/m2 Wt Readings from Last 3 Encounters:  08/29/14 224 lb 3.2 oz (101.696 kg)  05/08/14 232 lb (105.235 kg)  01/31/14 230 lb (104.327 kg)    General Appearance: Well nourished well developed, in no apparent distress. Eyes: PERRLA, EOMs, conjunctiva no swelling or erythema ENT/Mouth: Ear canals normal without obstruction, swelling, erythma, discharge.  TMs normal bilaterally.  Oropharynx moist, clear, without exudate, or postoropharyngeal swelling. Neck: Supple, thyroid normal,no cervical adenopathy  Respiratory: Respiratory effort normal, Breath sounds clear A&P without rhonchi, wheeze, or rale.  No retractions, no accessory usage. Cardio: RRR with no MRGs. Brisk peripheral pulses without edema.  Abdomen: Obese, Soft, + BS,  Non tender, no guarding, rebound, hernias, masses. Musculoskeletal: Full ROM, 5/5 strength, Normal gait Skin: Warm, dry without rashes, lesions, ecchymosis.  There is a small wound with minimal purulent drainage and fluctuance with some surrounding erythema on the left pink at the MCP.  There is full ROM of the left pinky. Neuro: Awake and oriented X 3, Cranial nerves intact. Normal muscle tone, no cerebellar symptoms. Psych: Normal affect, Insight and Judgment appropriate.    FORCUCCI, Lauretta Sallas, PA-C 10:00 AM [facility]

## 2014-08-29 NOTE — Patient Instructions (Signed)

## 2014-08-30 LAB — TSH: TSH: 2.094 u[IU]/mL (ref 0.350–4.500)

## 2014-08-30 LAB — HEMOGLOBIN A1C
HEMOGLOBIN A1C: 5.8 % — AB (ref <5.7)
MEAN PLASMA GLUCOSE: 120 mg/dL — AB (ref <117)

## 2014-08-30 LAB — VITAMIN D 25 HYDROXY (VIT D DEFICIENCY, FRACTURES): Vit D, 25-Hydroxy: 40 ng/mL (ref 30–100)

## 2014-08-30 LAB — INSULIN, FASTING: Insulin fasting, serum: 11.8 u[IU]/mL (ref 2.0–19.6)

## 2014-10-11 ENCOUNTER — Other Ambulatory Visit: Payer: Self-pay | Admitting: Physician Assistant

## 2014-12-01 ENCOUNTER — Ambulatory Visit: Payer: Self-pay | Admitting: Internal Medicine

## 2014-12-06 ENCOUNTER — Encounter: Payer: Self-pay | Admitting: Internal Medicine

## 2014-12-06 ENCOUNTER — Ambulatory Visit (INDEPENDENT_AMBULATORY_CARE_PROVIDER_SITE_OTHER): Payer: BC Managed Care – PPO | Admitting: Internal Medicine

## 2014-12-06 VITALS — BP 122/64 | HR 70 | Temp 98.2°F | Resp 16 | Ht 63.25 in | Wt 224.0 lb

## 2014-12-06 DIAGNOSIS — M25512 Pain in left shoulder: Secondary | ICD-10-CM

## 2014-12-06 DIAGNOSIS — Z79899 Other long term (current) drug therapy: Secondary | ICD-10-CM

## 2014-12-06 DIAGNOSIS — R7309 Other abnormal glucose: Secondary | ICD-10-CM

## 2014-12-06 DIAGNOSIS — I10 Essential (primary) hypertension: Secondary | ICD-10-CM

## 2014-12-06 DIAGNOSIS — R7303 Prediabetes: Secondary | ICD-10-CM

## 2014-12-06 DIAGNOSIS — M79671 Pain in right foot: Secondary | ICD-10-CM

## 2014-12-06 DIAGNOSIS — E559 Vitamin D deficiency, unspecified: Secondary | ICD-10-CM

## 2014-12-06 DIAGNOSIS — E785 Hyperlipidemia, unspecified: Secondary | ICD-10-CM

## 2014-12-06 LAB — CBC WITH DIFFERENTIAL/PLATELET
BASOS PCT: 0 % (ref 0–1)
Basophils Absolute: 0 10*3/uL (ref 0.0–0.1)
EOS ABS: 0.2 10*3/uL (ref 0.0–0.7)
Eosinophils Relative: 3 % (ref 0–5)
HCT: 41.9 % (ref 36.0–46.0)
Hemoglobin: 14 g/dL (ref 12.0–15.0)
Lymphocytes Relative: 33 % (ref 12–46)
Lymphs Abs: 2.5 10*3/uL (ref 0.7–4.0)
MCH: 29.8 pg (ref 26.0–34.0)
MCHC: 33.4 g/dL (ref 30.0–36.0)
MCV: 89.1 fL (ref 78.0–100.0)
MONOS PCT: 7 % (ref 3–12)
MPV: 10.4 fL (ref 8.6–12.4)
Monocytes Absolute: 0.5 10*3/uL (ref 0.1–1.0)
Neutro Abs: 4.3 10*3/uL (ref 1.7–7.7)
Neutrophils Relative %: 57 % (ref 43–77)
Platelets: 201 10*3/uL (ref 150–400)
RBC: 4.7 MIL/uL (ref 3.87–5.11)
RDW: 13.6 % (ref 11.5–15.5)
WBC: 7.6 10*3/uL (ref 4.0–10.5)

## 2014-12-06 LAB — HEPATIC FUNCTION PANEL
ALBUMIN: 4.3 g/dL (ref 3.5–5.2)
ALT: 13 U/L (ref 0–35)
AST: 14 U/L (ref 0–37)
Alkaline Phosphatase: 65 U/L (ref 39–117)
BILIRUBIN INDIRECT: 0.3 mg/dL (ref 0.2–1.2)
Bilirubin, Direct: 0.1 mg/dL (ref 0.0–0.3)
TOTAL PROTEIN: 7 g/dL (ref 6.0–8.3)
Total Bilirubin: 0.4 mg/dL (ref 0.2–1.2)

## 2014-12-06 LAB — MAGNESIUM: MAGNESIUM: 2.1 mg/dL (ref 1.5–2.5)

## 2014-12-06 LAB — LIPID PANEL
CHOL/HDL RATIO: 5.1 ratio
Cholesterol: 272 mg/dL — ABNORMAL HIGH (ref 0–200)
HDL: 53 mg/dL (ref >=46)
LDL Cholesterol: 183 mg/dL — ABNORMAL HIGH (ref 0–99)
Triglycerides: 179 mg/dL — ABNORMAL HIGH (ref <150)
VLDL: 36 mg/dL (ref 0–40)

## 2014-12-06 LAB — BASIC METABOLIC PANEL WITH GFR
BUN: 17 mg/dL (ref 6–23)
CALCIUM: 9.6 mg/dL (ref 8.4–10.5)
CO2: 27 meq/L (ref 19–32)
Chloride: 101 mEq/L (ref 96–112)
Creat: 0.7 mg/dL (ref 0.50–1.10)
GFR, Est African American: >89 mL/min
GFR, Est Non African American: >89 mL/min
GLUCOSE: 83 mg/dL (ref 70–99)
Potassium: 4.2 mEq/L (ref 3.5–5.3)
SODIUM: 142 meq/L (ref 135–145)

## 2014-12-06 LAB — TSH: TSH: 2.236 u[IU]/mL (ref 0.350–4.500)

## 2014-12-06 LAB — HEMOGLOBIN A1C
HEMOGLOBIN A1C: 5.8 % — AB (ref <5.7)
MEAN PLASMA GLUCOSE: 120 mg/dL — AB (ref <117)

## 2014-12-06 MED ORDER — ROSUVASTATIN CALCIUM 20 MG PO TABS: ORAL_TABLET | ORAL | Status: DC

## 2014-12-06 MED ORDER — MELOXICAM 15 MG PO TABS: 15.0000 mg | ORAL_TABLET | Freq: Every day | ORAL | Status: DC

## 2014-12-06 NOTE — Patient Instructions (Signed)
Rotator Cuff Tendinitis  Rotator cuff tendinitis is inflammation of the tough, cord-like bands that connect muscle to bone (tendons) in your rotator cuff. Your rotator cuff is the collection of all the muscles and tendons that connect your arm to your shoulder. Your rotator cuff holds the head of your upper arm bone (humerus) in the cup (fossa) of your shoulder blade (scapula). CAUSES Rotator cuff tendinitis is usually caused by overusing the joint involved.  SIGNS AND SYMPTOMS  Deep ache in the shoulder also felt on the outside upper arm over the shoulder muscle.  Point tenderness over the area that is injured.  Pain comes on gradually and becomes worse with lifting the arm to the side (abduction) or turning it inward (internal rotation).  May lead to a chronic tear: When a rotator cuff tendon becomes inflamed, it runs the risk of losing its blood supply, causing some tendon fibers to die. This increases the risk that the tendon can fray and partially or completely tear. DIAGNOSIS Rotator cuff tendinitis is diagnosed by taking a medical history, performing a physical exam, and reviewing results of imaging exams. The medical history is useful to help determine the type of rotator cuff injury. The physical exam will include looking at the injured shoulder, feeling the injured area, and watching you do range-of-motion exercises. X-ray exams are typically done to rule out other causes of shoulder pain, such as fractures. MRI is the imaging exam usually used for significant shoulder injuries. Sometimes a dye study called CT arthrogram is done, but it is not as widely used as MRI. In some institutions, special ultrasound tests may also be used to aid in the diagnosis. TREATMENT  Less Severe Cases  Use of a sling to rest the shoulder for a short period of time. Prolonged use of the sling can cause stiffness, weakness, and loss of motion of the shoulder joint.  Anti-inflammatory medicines, such as  ibuprofen or naproxen sodium, may be prescribed. More Severe Cases  Physical therapy.  Use of steroid injections into the shoulder joint.  Surgery. HOME CARE INSTRUCTIONS   Use a sling or splint until the pain decreases. Prolonged use of the sling can cause stiffness, weakness, and loss of motion of the shoulder joint.  Apply ice to the injured area:  Put ice in a plastic bag.  Place a towel between your skin and the bag.  Leave the ice on for 20 minutes, 2-3 times a day.  Try to avoid use other than gentle range of motion while your shoulder is painful. Use the shoulder and exercise only as directed by your health care provider. Stop exercises or range of motion if pain or discomfort increases, unless directed otherwise by your health care provider.  Only take over-the-counter or prescription medicines for pain, discomfort, or fever as directed by your health care provider.  If you were given a shoulder sling and straps (immobilizer), do not remove it except as directed, or until you see a health care provider for a follow-up exam. If you need to remove it, move your arm as little as possible or as directed.  You may want to sleep on several pillows at night to lessen swelling and pain. SEEK IMMEDIATE MEDICAL CARE IF:   Your shoulder pain increases or new pain develops in your arm, hand, or fingers and is not relieved with medicines.  You have new, unexplained symptoms, especially increased numbness in the hands or loss of strength.  You develop any worsening of the problems  that brought you in for care.  Your arm, hand, or fingers are numb or tingling.  Your arm, hand, or fingers are swollen, painful, or turn white or blue. MAKE SURE YOU:  Understand these instructions.  Will watch your condition.  Will get help right away if you are not doing well or get worse. Document Released: 08/09/2003 Document Revised: 03/09/2013 Document Reviewed: 12/29/2012 [facility] Patient  Information 2015 [place], [place]. This information is not intended to replace advice given to you by your health care provider. Make sure you discuss any questions you have with your health care provider.

## 2014-12-06 NOTE — Progress Notes (Signed)
Patient ID: Whitney Herrera, female   DOB: [birth year], 59 y.o.   MRN: [id number]  Assessment and Plan:    1. Essential hypertension -cont meds -monitor at home -Diet and exercise - TSH  2. Prediabetes -diet and exercise - Hemoglobin A1c - Insulin, random  3. Hyperlipidemia -cont crestor - Lipid panel  4. Vitamin D deficiency -cont supplement - Vit D  25 hydroxy (rtn osteoporosis monitoring)  5. Medication management  - CBC with Differential/Platelet - BASIC METABOLIC PANEL WITH GFR - Hepatic function panel - Magnesium  6. Left shoulder pain -mobic  7. Right foot pain -mobic     Continue diet and meds as discussed. Further disposition pending results of labs.  HPI 59 y.o. female  presents for 3 month follow up with hypertension, hyperlipidemia, prediabetes and vitamin D.   Her blood pressure has been controlled at home, today their BP is BP: 122/64 mmHg.   She does not workout. She denies chest pain, shortness of breath, dizziness.   She is on cholesterol medication and denies myalgias. Her cholesterol is not at goal. The cholesterol last visit was:   Lab Results  Component Value Date   CHOL 189 08/29/2014   HDL 42* 08/29/2014   LDLCALC 131* 08/29/2014   TRIG 78 08/29/2014   CHOLHDL 4.5 08/29/2014     She has been working on diet and exercise for prediabetes, and denies foot ulcerations, hyperglycemia, hypoglycemia , increased appetite, nausea, paresthesia of the feet, polydipsia, polyuria, visual disturbances, vomiting and weight loss. Last A1C in the office was:  Lab Results  Component Value Date   HGBA1C 5.8* 08/29/2014  She does bring her lunch to work and is eating sandwich.  She reports that she has had some chicken salad recently.    Patient is on Vitamin D supplement.  Lab Results  Component Value Date   VD25OH 40 08/29/2014     She does report left shoulder pain x several months and also right foot pain x 1 week.  She has been trying tylenol  and ibuprofen with some relief.    Current Medications:  Current Outpatient Prescriptions on File Prior to Visit  Medication Sig Dispense Refill  . atenolol (TENORMIN) 100 MG tablet TAKE 1 TABLET BY MOUTH DAILY. 90 tablet 3  . cevimeline (EVOXAC) 30 MG capsule Take 30 mg by mouth 3 (three) times daily. PRN    . Cholecalciferol (VITAMIN D PO) Take 5,000 Units by mouth daily.     . CRESTOR 20 MG tablet TAKE 1 TABLET BY MOUTH DAILY FOR CHOLESTEROL. 30 tablet PRN  . escitalopram (LEXAPRO) 20 MG tablet TAKE 1 TABLET BY MOUTH DAILY. 90 tablet 3  . hydroxychloroquine (PLAQUENIL) 200 MG tablet Take 200 mg by mouth daily.    . hyoscyamine (LEVSIN/SL) 0.125 MG SL tablet Place 1 tablet (0.125 mg total) under the tongue every 4 (four) hours as needed for cramping (nausea, diarrhea). 50 tablet 1   No current facility-administered medications on file prior to visit.    Medical History:  Past Medical History  Diagnosis Date  . Hypertension   . Hyperlipidemia   . Anxiety   . Allergic rhinitis   . Murmur   . Obesity   . B12 deficiency   . Vitamin D deficiency   . Sjogren's disease     Dr. Amil Amen  . Prediabetes     Allergies:  Allergies  Allergen Reactions  . Lipitor [Atorvastatin]   . Wellbutrin [Bupropion]     dysphoria  Review of Systems:  Review of Systems  Constitutional: Negative for fever, chills and malaise/fatigue.  HENT: Negative for congestion, ear pain and sore throat.   Eyes: Negative.   Respiratory: Negative for cough, shortness of breath and wheezing.   Cardiovascular: Negative for chest pain, palpitations and leg swelling.  Gastrointestinal: Negative.  Negative for diarrhea, constipation, blood in stool and melena.  Genitourinary: Negative.   Skin: Negative.   Neurological: Negative for dizziness, sensory change, loss of consciousness and headaches.  Psychiatric/Behavioral: Negative for depression. The patient has insomnia. The patient is not nervous/anxious.      Family history- Review and unchanged  Social history- Review and unchanged  Physical Exam: BP 122/64 mmHg  Pulse 70  Temp(Src) 98.2 F (36.8 C) (Temporal)  Resp 16  Ht 5' 3.25" (1.607 m)  Wt 224 lb (101.606 kg)  BMI 39.34 kg/m2 Wt Readings from Last 3 Encounters:  12/06/14 224 lb (101.606 kg)  08/29/14 224 lb 3.2 oz (101.696 kg)  05/08/14 232 lb (105.235 kg)    General Appearance: Well nourished well developed, in no apparent distress. Eyes: PERRLA, EOMs, conjunctiva no swelling or erythema ENT/Mouth: Ear canals normal without obstruction, swelling, erythma, discharge.  TMs normal bilaterally.  Oropharynx moist, clear, without exudate, or postoropharyngeal swelling. Neck: Supple, thyroid normal,no cervical adenopathy  Respiratory: Respiratory effort normal, Breath sounds clear A&P without rhonchi, wheeze, or rale.  No retractions, no accessory usage. Cardio: RRR with no MRGs. Brisk peripheral pulses without edema.  Abdomen: Soft, + BS,  Non tender, no guarding, rebound, hernias, masses. Musculoskeletal: Full ROM, 5/5 strength, Normal gait, Shoulder non-tender to palpation Full rom, negative speeds test, negative hawkins kennedy, painful empty can.  right foot with crepitus over the 5th talometotalal joint Skin: Warm, dry without rashes, lesions, ecchymosis.  Neuro: Awake and oriented X 3, Cranial nerves intact. Normal muscle tone, no cerebellar symptoms. Psych: Normal affect, Insight and Judgment appropriate.    FORCUCCI, Zailen Albarran, PA-C 3:54 PM [facility]

## 2014-12-07 LAB — VITAMIN D 25 HYDROXY (VIT D DEFICIENCY, FRACTURES): Vit D, 25-Hydroxy: 35 ng/mL (ref 30–100)

## 2014-12-07 LAB — INSULIN, RANDOM: Insulin: 8.7 u[IU]/mL (ref 2.0–19.6)

## 2015-02-12 ENCOUNTER — Other Ambulatory Visit: Payer: Self-pay | Admitting: *Deleted

## 2015-02-12 ENCOUNTER — Ambulatory Visit (INDEPENDENT_AMBULATORY_CARE_PROVIDER_SITE_OTHER): Payer: BC Managed Care – PPO | Admitting: Internal Medicine

## 2015-02-12 ENCOUNTER — Encounter: Payer: Self-pay | Admitting: Internal Medicine

## 2015-02-12 VITALS — BP 132/84 | HR 76 | Temp 97.3°F | Resp 16 | Ht 62.5 in | Wt 231.6 lb

## 2015-02-12 DIAGNOSIS — Z1212 Encounter for screening for malignant neoplasm of rectum: Secondary | ICD-10-CM

## 2015-02-12 DIAGNOSIS — I10 Essential (primary) hypertension: Secondary | ICD-10-CM

## 2015-02-12 DIAGNOSIS — R7309 Other abnormal glucose: Secondary | ICD-10-CM

## 2015-02-12 DIAGNOSIS — E538 Deficiency of other specified B group vitamins: Secondary | ICD-10-CM

## 2015-02-12 DIAGNOSIS — Z111 Encounter for screening for respiratory tuberculosis: Secondary | ICD-10-CM | POA: Diagnosis not present

## 2015-02-12 DIAGNOSIS — R5383 Other fatigue: Secondary | ICD-10-CM

## 2015-02-12 DIAGNOSIS — Z6841 Body Mass Index (BMI) 40.0 and over, adult: Secondary | ICD-10-CM

## 2015-02-12 DIAGNOSIS — E559 Vitamin D deficiency, unspecified: Secondary | ICD-10-CM

## 2015-02-12 DIAGNOSIS — E785 Hyperlipidemia, unspecified: Secondary | ICD-10-CM

## 2015-02-12 DIAGNOSIS — Z79899 Other long term (current) drug therapy: Secondary | ICD-10-CM | POA: Diagnosis not present

## 2015-02-12 DIAGNOSIS — R7303 Prediabetes: Secondary | ICD-10-CM

## 2015-02-12 DIAGNOSIS — Z0000 Encounter for general adult medical examination without abnormal findings: Secondary | ICD-10-CM

## 2015-02-12 LAB — CBC WITH DIFFERENTIAL/PLATELET
BASOS PCT: 1 % (ref 0–1)
Basophils Absolute: 0.1 10*3/uL (ref 0.0–0.1)
EOS PCT: 4 % (ref 0–5)
Eosinophils Absolute: 0.3 10*3/uL (ref 0.0–0.7)
HCT: 40.7 % (ref 36.0–46.0)
HEMOGLOBIN: 13.2 g/dL (ref 12.0–15.0)
Lymphocytes Relative: 33 % (ref 12–46)
Lymphs Abs: 2.3 10*3/uL (ref 0.7–4.0)
MCH: 29.1 pg (ref 26.0–34.0)
MCHC: 32.4 g/dL (ref 30.0–36.0)
MCV: 89.8 fL (ref 78.0–100.0)
MONO ABS: 0.6 10*3/uL (ref 0.1–1.0)
MONOS PCT: 8 % (ref 3–12)
MPV: 10.7 fL (ref 8.6–12.4)
NEUTROS ABS: 3.8 10*3/uL (ref 1.7–7.7)
Neutrophils Relative %: 54 % (ref 43–77)
Platelets: 193 10*3/uL (ref 150–400)
RBC: 4.53 MIL/uL (ref 3.87–5.11)
RDW: 13.4 % (ref 11.5–15.5)
WBC: 7.1 10*3/uL (ref 4.0–10.5)

## 2015-02-12 LAB — LIPID PANEL
CHOLESTEROL: 167 mg/dL (ref 125–200)
HDL: 48 mg/dL (ref >=46)
LDL Cholesterol: 94 mg/dL (ref <130)
Total CHOL/HDL Ratio: 3.5 Ratio (ref <=5.0)
Triglycerides: 124 mg/dL (ref <150)
VLDL: 25 mg/dL (ref <30)

## 2015-02-12 LAB — BASIC METABOLIC PANEL WITH GFR
BUN: 16 mg/dL (ref 7–25)
CALCIUM: 9 mg/dL (ref 8.6–10.4)
CO2: 29 mmol/L (ref 20–31)
CREATININE: 0.73 mg/dL (ref 0.50–1.05)
Chloride: 105 mmol/L (ref 98–110)
GFR, Est Non African American: >89 mL/min (ref >=60)
GLUCOSE: 91 mg/dL (ref 65–99)
Potassium: 4.3 mmol/L (ref 3.5–5.3)
SODIUM: 143 mmol/L (ref 135–146)

## 2015-02-12 LAB — HEPATIC FUNCTION PANEL
ALT: 13 U/L (ref 6–29)
AST: 16 U/L (ref 10–35)
Albumin: 4 g/dL (ref 3.6–5.1)
Alkaline Phosphatase: 59 U/L (ref 33–130)
BILIRUBIN DIRECT: 0.1 mg/dL (ref <=0.2)
Indirect Bilirubin: 0.2 mg/dL (ref 0.2–1.2)
TOTAL PROTEIN: 6.5 g/dL (ref 6.1–8.1)
Total Bilirubin: 0.3 mg/dL (ref 0.2–1.2)

## 2015-02-12 LAB — IRON AND TIBC
%SAT: 27 % (ref 11–50)
IRON: 94 ug/dL (ref 45–160)
TIBC: 347 ug/dL (ref 250–450)
UIBC: 253 ug/dL (ref 125–400)

## 2015-02-12 LAB — MAGNESIUM: MAGNESIUM: 2 mg/dL (ref 1.5–2.5)

## 2015-02-12 LAB — VITAMIN B12: VITAMIN B 12: 598 pg/mL (ref 211–911)

## 2015-02-12 LAB — TSH: TSH: 2.447 u[IU]/mL (ref 0.350–4.500)

## 2015-02-12 MED ORDER — ATENOLOL 100 MG PO TABS: 100.0000 mg | ORAL_TABLET | Freq: Every day | ORAL | Status: DC

## 2015-02-12 NOTE — Patient Instructions (Signed)

## 2015-02-12 NOTE — Progress Notes (Signed)
Patient ID: Whitney Herrera, female   DOB: [birth year], 59 y.o.   MRN: [id number]   Comprehensive Examination  This very nice 59 y.o. MWF presents for complete physical.  Patient has been followed for HTN, Prediabetes, Hyperlipidemia, and Vitamin D Deficiency.    HTN predates since 2005. Patient's BP has been controlled at home and patient denies any cardiac symptoms as chest pain, palpitations, shortness of breath, dizziness or ankle swelling. Today's BP: 132/84 mmHg    Patient's hyperlipidemia is controlled with diet and medications. Patient denies myalgias or other medication SE's. Today's lipids are at goal - Cholesterol 167; HDL 48; LDL 94; Triglycerides 124.   Patient has Morbid Obesity (BMI 41.66) and consequent prediabetes predating since 2011 with A1c 5.9% and then in 2012 with A1c 6.0% and patient denies reactive hypoglycemic symptoms, visual blurring, diabetic polys or paresthesias. Last A1c was 5.8% on 12/06/2014.   Finally, patient has history of Vitamin D Deficiency of 34 in 2013 & 20 in 2008 and last Vitamin D was 35 on 12/06/2014.      Medication Sig  . cevimeline (EVOXAC) 30 MG capsule Take 30 mg by mouth 3 (three) times daily. PRN  . VITAMIN D  Take 5,000 Units by mouth daily.   Marland Kitchen escitalopram  20 MG tablet TAKE 1 TABLET BY MOUTH DAILY.  . hydroxychloroquine (PLAQUENIL) 200 MG  Take 200 mg by mouth daily.  . hyoscyamine (LEVSIN/SL) 0.125 MG  Place 1 tablet (0.125 mg total) under the tongue every 4 (four) hours as needed for cramping (nausea, diarrhea).  . meloxicam  15 MG tablet Take 1 tablet (15 mg total) by mouth daily.  . rosuvastatin  20 MG tablet TAKE 1 TABLET BY MOUTH DAILY FOR CHOLESTEROL.  Marland Kitchen atenolol  100 MG tablet TAKE 1 TABLET BY MOUTH DAILY.   Allergies  Allergen Reactions  . Lipitor [Atorvastatin]   . Wellbutrin [Bupropion]     dysphoria   Past Medical History  Diagnosis Date  . Hypertension   . Hyperlipidemia   . Obesity   . B12 deficiency   . Vitamin D  deficiency   . Prediabetes    Health Maintenance  Topic Date Due  . PAP SMEAR  01/24/1977  . MAMMOGRAM  11/30/2014  . INFLUENZA VACCINE  01/01/2015  . TETANUS/TDAP  04/17/2017  . COLONOSCOPY  01/15/2018  . Hepatitis C Screening  Completed  . HIV Screening  Completed   Immunization History  Administered Date(s) Administered  . PPD Test 01/31/2014, 02/12/2015  . Pneumococcal-Unspecified 06/17/2008  . Td 04/18/2007   Past Surgical History  Procedure Laterality Date  . Breast biopsy Right 1975  . Appendectomy    . Inguinal hernia repair Bilateral age 20   Family History  Problem Relation Age of Onset  . Hypertension Mother   . Alzheimer's disease Mother   . Hypertension Father   . Alzheimer's disease Father    Social History  Substance Use Topics  . Smoking status: Never Smoker   . Smokeless tobacco: Never Used  . Alcohol Use: 1.2 oz/week    2 Standard drinks or equivalent per week     Comment: wine    ROS Constitutional: Denies fever, chills, weight loss/gain, headaches, insomnia,  night sweats, and change in appetite. Does c/o fatigue. Eyes: Denies redness, blurred vision, diplopia, discharge, itchy, watery eyes.  ENT: Denies discharge, congestion, post nasal drip, epistaxis, sore throat, earache, hearing loss, dental pain, Tinnitus, Vertigo, Sinus pain, snoring.  Cardio: Denies chest pain, palpitations, irregular  heartbeat, syncope, dyspnea, diaphoresis, orthopnea, PND, claudication, edema Respiratory: denies cough, dyspnea, DOE, pleurisy, hoarseness, laryngitis, wheezing.  Gastrointestinal: Denies dysphagia, heartburn, reflux, water brash, pain, cramps, nausea, vomiting, bloating, diarrhea, constipation, hematemesis, melena, hematochezia, jaundice, hemorrhoids Genitourinary: Denies dysuria, frequency, urgency, nocturia, hesitancy, discharge, hematuria, flank pain Breast: Breast lumps, nipple discharge, bleeding.  Musculoskeletal: Denies arthralgia, myalgia, stiffness,  Jt. Swelling, pain, limp, and strain/sprain. Denies falls. Skin: Denies puritis, rash, hives, warts, acne, eczema, changing in skin lesion Neuro: No weakness, tremor, incoordination, spasms, paresthesia, pain Psychiatric: Denies confusion, memory loss, sensory loss. Denies Depression. Endocrine: Denies change in weight, skin, hair change, nocturia, and paresthesia, diabetic polys, visual blurring, hyper / hypo glycemic episodes.  Heme/Lymph: No excessive bleeding, bruising, enlarged lymph nodes.  Physical Exam  BP 132/84  Pulse 76  Temp 97.3 F   Resp 16  Ht 5' 2.5"   Wt 231 lb 9.6 oz     BMI 41.66   General Appearance: Well nourished and in no apparent distress. Eyes: PERRLA, EOMs, conjunctiva no swelling or erythema, normal fundi and vessels. Sinuses: No frontal/maxillary tenderness ENT/Mouth: EACs patent / TMs  nl. Nares clear without erythema, swelling, mucoid exudates. Oral hygiene is good. No erythema, swelling, or exudate. Tongue normal, non-obstructing. Tonsils not swollen or erythematous. Hearing normal.  Neck: Supple, thyroid normal. No bruits, nodes or JVD. Respiratory: Respiratory effort normal.  BS equal and clear bilateral without rales, rhonci, wheezing or stridor. Cardio: Heart sounds are normal with regular rate and rhythm and no murmurs, rubs or gallops. Peripheral pulses are normal and equal bilaterally without edema. No aortic or femoral bruits. Chest: symmetric with normal excursions and percussion. Breasts: Symmetric, without lumps, nipple discharge, retractions, or fibrocystic changes.  Abdomen: Flat, soft, with bowel sounds. Nontender, no guarding, rebound, hernias, masses, or organomegaly.  Lymphatics: Non tender without lymphadenopathy.  Genitourinary: Deferred to GYN (Dr Radene Knee)  Musculoskeletal: Full ROM all peripheral extremities, joint stability, 5/5 strength, and normal gait. Skin: Warm and dry without rashes, lesions, cyanosis, clubbing or  ecchymosis.   Neuro: Cranial nerves intact, reflexes equal bilaterally. Normal muscle tone, no cerebellar symptoms. Sensation intact.  Pysch: Awake and oriented X 3, normal affect, Insight and Judgment appropriate.   Assessment and Plan  1. Essential hypertension  - Microalbumin / creatinine urine ratio - EKG 12-Lead - [place], RETROPERITNL ABD,  LTD - TSH  2. Hyperlipidemia  - Lipid panel  3. Prediabetes  - Hemoglobin A1c - Insulin, random  4. Vitamin D deficiency  - Vit D  25 hydroxy  5. Morbid Obesity (BMI 41.66)    6. Screening for rectal cancer  - POC Hemoccult Bld/Stl  7. B12 deficiency  - Vitamin B12  8. Medication management  - Urinalysis, Routine w reflex microscopic  - CBC with Differential/Platelet - BASIC METABOLIC PANEL WITH GFR - Hepatic function panel - Magnesium  9. Other fatigue  - Iron and TIBC - TSH  10. BMI 40.0-44.9, adult   11. Screening examination for pulmonary tuberculosis  - PPD   Continue prudent diet as discussed, weight control, BP monitoring, regular exercise, and medications. Discussed med's effects and SE's. Screening labs and tests as requested with regular follow-up as recommended.  Over 40 minutes of exam, counseling, chart review was performed.

## 2015-02-13 LAB — URINALYSIS, ROUTINE W REFLEX MICROSCOPIC
BILIRUBIN URINE: NEGATIVE
GLUCOSE, UA: NEGATIVE
HGB URINE DIPSTICK: NEGATIVE
KETONES UR: NEGATIVE
Nitrite: NEGATIVE
PROTEIN: NEGATIVE
Specific Gravity, Urine: 1.01 (ref 1.001–1.035)
pH: 6 (ref 5.0–8.0)

## 2015-02-13 LAB — VITAMIN D 25 HYDROXY (VIT D DEFICIENCY, FRACTURES): VIT D 25 HYDROXY: 45 ng/mL (ref 30–100)

## 2015-02-13 LAB — MICROALBUMIN / CREATININE URINE RATIO
Creatinine, Urine: 53.5 mg/dL
MICROALB UR: 0.2 mg/dL (ref <2.0)
Microalb Creat Ratio: 3.7 mg/g (ref 0.0–30.0)

## 2015-02-13 LAB — INSULIN, RANDOM: Insulin: 18.2 u[IU]/mL (ref 2.0–19.6)

## 2015-02-13 LAB — URINALYSIS, MICROSCOPIC ONLY
Bacteria, UA: NONE SEEN [HPF]
CASTS: NONE SEEN [LPF]
CRYSTALS: NONE SEEN [HPF]
RBC / HPF: NONE SEEN RBC/HPF (ref <=2)
YEAST: NONE SEEN [HPF]

## 2015-02-13 LAB — HEMOGLOBIN A1C
HEMOGLOBIN A1C: 5.8 % — AB (ref <5.7)
MEAN PLASMA GLUCOSE: 120 mg/dL — AB (ref <117)

## 2015-05-15 ENCOUNTER — Ambulatory Visit (INDEPENDENT_AMBULATORY_CARE_PROVIDER_SITE_OTHER): Payer: BC Managed Care – PPO | Admitting: Internal Medicine

## 2015-05-15 ENCOUNTER — Encounter: Payer: Self-pay | Admitting: Internal Medicine

## 2015-05-15 VITALS — BP 140/82 | HR 70 | Temp 97.8°F | Resp 18 | Ht 62.5 in | Wt 233.0 lb

## 2015-05-15 DIAGNOSIS — E785 Hyperlipidemia, unspecified: Secondary | ICD-10-CM | POA: Diagnosis not present

## 2015-05-15 DIAGNOSIS — I10 Essential (primary) hypertension: Secondary | ICD-10-CM

## 2015-05-15 DIAGNOSIS — M7711 Lateral epicondylitis, right elbow: Secondary | ICD-10-CM | POA: Diagnosis not present

## 2015-05-15 DIAGNOSIS — R7303 Prediabetes: Secondary | ICD-10-CM | POA: Diagnosis not present

## 2015-05-15 DIAGNOSIS — E559 Vitamin D deficiency, unspecified: Secondary | ICD-10-CM | POA: Diagnosis not present

## 2015-05-15 DIAGNOSIS — Z79899 Other long term (current) drug therapy: Secondary | ICD-10-CM | POA: Diagnosis not present

## 2015-05-15 NOTE — Progress Notes (Signed)
Patient ID: KESSIE SMITHER, female   DOB: 1956/01/15, 59 y.o.   MRN: [id number]  Assessment and Plan:  Hypertension:  -Continue medication,  -monitor blood pressure at home.  -Continue DASH diet.   -Reminder to go to the ER if any CP, SOB, nausea, dizziness, severe HA, changes vision/speech, left arm numbness and tingling, and jaw pain.  Cholesterol: -Continue diet and exercise.  -Check cholesterol.   Pre-diabetes: -Continue diet and exercise.  -Check A1C  Vitamin D Def: -check level -continue medications.   Right elbow epicondylitis -mobic x 2 weeks -tennis elbow strap  Continue diet and meds as discussed. Further disposition pending results of labs.  HPI 59 y.o. female  presents for 3 month follow up with hypertension, hyperlipidemia, prediabetes and vitamin D.   Her blood pressure has been controlled at home, today their BP is BP: 140/82 mmHg.   She does workout. She denies chest pain, shortness of breath, dizziness.   She is on cholesterol medication and denies myalgias. Her cholesterol is at goal. The cholesterol last visit was:   Lab Results  Component Value Date   CHOL 167 02/12/2015   HDL 48 02/12/2015   LDLCALC 94 02/12/2015   TRIG 124 02/12/2015   CHOLHDL 3.5 02/12/2015     She has been working on diet and exercise for prediabetes, and denies foot ulcerations, hyperglycemia, hypoglycemia , increased appetite, nausea, paresthesia of the feet, polydipsia, polyuria, visual disturbances, vomiting and weight loss. Last A1C in the office was:  Lab Results  Component Value Date   HGBA1C 5.8* 02/12/2015    Patient is on Vitamin D supplement.  Lab Results  Component Value Date   VD25OH 45 02/12/2015      Patient complains of some right elbow pain on the lateral side. Hurts worse with turning hand and also with flexion of the elbow.    Current Medications:  Current Outpatient Prescriptions on File Prior to Visit  Medication Sig Dispense Refill  . atenolol  (TENORMIN) 100 MG tablet Take 1 tablet (100 mg total) by mouth daily. 90 tablet 3  . Cholecalciferol (VITAMIN D PO) Take 5,000 Units by mouth daily.     Marland Kitchen escitalopram (LEXAPRO) 20 MG tablet TAKE 1 TABLET BY MOUTH DAILY. 90 tablet 3  . hyoscyamine (LEVSIN/SL) 0.125 MG SL tablet Place 1 tablet (0.125 mg total) under the tongue every 4 (four) hours as needed for cramping (nausea, diarrhea). 50 tablet 1  . meloxicam (MOBIC) 15 MG tablet Take 1 tablet (15 mg total) by mouth daily. 30 tablet 1  . rosuvastatin (CRESTOR) 20 MG tablet TAKE 1 TABLET BY MOUTH DAILY FOR CHOLESTEROL. 30 tablet PRN   No current facility-administered medications on file prior to visit.    Medical History:  Past Medical History  Diagnosis Date  . Hypertension   . Hyperlipidemia   . Obesity   . B12 deficiency   . Vitamin D deficiency   . Prediabetes     Allergies:  Allergies  Allergen Reactions  . Lipitor [Atorvastatin]   . Wellbutrin [Bupropion]     dysphoria     Review of Systems:  Review of Systems  Constitutional: Negative for fever, chills and malaise/fatigue.  HENT: Negative for congestion, ear pain and sore throat.   Eyes: Negative.   Respiratory: Negative for cough, shortness of breath and wheezing.   Cardiovascular: Negative for chest pain, palpitations and leg swelling.  Gastrointestinal: Negative for heartburn, diarrhea, constipation, blood in stool and melena.  Genitourinary: Negative.  Skin: Negative.   Neurological: Negative for dizziness, sensory change, loss of consciousness and headaches.  Psychiatric/Behavioral: Negative for depression. The patient is not nervous/anxious and does not have insomnia.     Family history- Review and unchanged  Social history- Review and unchanged  Physical Exam: BP 140/82 mmHg  Pulse 70  Temp(Src) 97.8 F (36.6 C) (Temporal)  Resp 18  Ht 5' 2.5" (1.588 m)  Wt 233 lb (105.688 kg)  BMI 41.91 kg/m2 Wt Readings from Last 3 Encounters:  05/15/15 233  lb (105.688 kg)  02/12/15 231 lb 9.6 oz (105.053 kg)  12/06/14 224 lb (101.606 kg)    General Appearance: Well nourished well developed, in no apparent distress. Eyes: PERRLA, EOMs, conjunctiva no swelling or erythema ENT/Mouth: Ear canals normal without obstruction, swelling, erythma, discharge.  TMs normal bilaterally.  Oropharynx moist, clear, without exudate, or postoropharyngeal swelling. Neck: Supple, thyroid normal,no cervical adenopathy  Respiratory: Respiratory effort normal, Breath sounds clear A&P without rhonchi, wheeze, or rale.  No retractions, no accessory usage. Cardio: RRR with no MRGs. Brisk peripheral pulses without edema.  Abdomen: Soft, + BS,  Non tender, no guarding, rebound, hernias, masses. Musculoskeletal: Full ROM, 5/5 strength, Normal gait.  Right lateral epicondyle tenderness to palpation.  Pain with rotation of the wrist. Skin: Warm, dry without rashes, lesions, ecchymosis.  Neuro: Awake and oriented X 3, Cranial nerves intact. Normal muscle tone, no cerebellar symptoms. Psych: Normal affect, Insight and Judgment appropriate.    Starlyn Skeans, PA-C 3:51 PM [facility]

## 2015-05-15 NOTE — Patient Instructions (Signed)
Atomoxetine capsules  What is this medicine?  ATOMOXETINE (AT oh mox e teen) is used to treat attention deficit/hyperactivity disorder, also known as ADHD. It is not a stimulant like other drugs for ADHD. This drug can improve attention span, concentration, and emotional control. It can also reduce restless or overactive behavior.  This medicine may be used for other purposes; ask your health care provider or pharmacist if you have questions.  What should I tell my health care provider before I take this medicine?  They need to know if you have any of these conditions:  -glaucoma  -high or low blood pressure  -history of stroke  -irregular heartbeat or other cardiac disease  -liver disease  -mania or bipolar disorder  -pheochromocytoma  -suicidal thoughts  -an unusual or allergic reaction to atomoxetine, other medicines, foods, dyes, or preservatives  -pregnant or trying to get pregnant  -breast-feeding  How should I use this medicine?  Take this medicine by mouth with a glass of water. Follow the directions on the prescription label. You can take it with or without food. If it upsets your stomach, take it with food. If you have difficulty sleeping and you take more than 1 dose per day, take your last dose before 6 PM. Take your medicine at regular intervals. Do not take it more often than directed. Do not stop taking except on your doctor's advice.  A special MedGuide will be given to you by the pharmacist with each prescription and refill. Be sure to read this information carefully each time.  Talk to your pediatrician regarding the use of this medicine in children. While this drug may be prescribed for children as young as 6 years for selected conditions, precautions do apply.  Overdosage: If you think you have taken too much of this medicine contact a poison control center or emergency room at once.  NOTE: This medicine is only for you. Do not share this medicine with others.  What if I miss a dose?  If you miss  a dose, take it as soon as you can. If it is almost time for your next dose, take only that dose. Do not take double or extra doses.  What may interact with this medicine?  Do not take this medicine with any of the following medications:  -cisapride  -dofetilide  -dronedarone  -MAOIs like Carbex, Eldepryl, Marplan, Nardil, and Parnate  -pimozide  -reboxetine  -thioridazine  -ziprasidone  This medicine may also interact with the following medications:  -certain medicines for blood pressure, heart disease, irregular heart beat  -certain medicines for depression, anxiety, or psychotic disturbances  -certain medicines for lung disease like albuterol  -cold or allergy medicines  -fluoxetine  -medicines that increase blood pressure like dopamine, dobutamine, or ephedrine  -other medicines that prolong the QT interval (cause an abnormal heart rhythm)  -paroxetine  -quinidine  -stimulant medicines for attention disorders, weight loss, or to stay awake  This list may not describe all possible interactions. Give your health care provider a list of all the medicines, herbs, non-prescription drugs, or dietary supplements you use. Also tell them if you smoke, drink alcohol, or use illegal drugs. Some items may interact with your medicine.  What should I watch for while using this medicine?  It may take a week or more for this medicine to take effect. This is why it is very important to continue taking the medicine and not miss any doses. If you have been taking this medicine regularly   for some time, do not suddenly stop taking it. Ask your doctor or health care professional for advice.  Rarely, this medicine may increase thoughts of suicide or suicide attempts in children and teenagers. Call your child's health care professional right away if your child or teenager has new or increased thoughts of suicide or has changes in mood or behavior like becoming irritable or anxious. Regularly monitor your child for these behavioral  changes.  For males, contact you doctor or health care professional right away if you have an erection that lasts longer than 4 hours or if it becomes painful. This may be a sign of serious problem and must be treated right away to prevent permanent damage.  You may get drowsy or dizzy. Do not drive, use machinery, or do anything that needs mental alertness until you know how this medicine affects you. Do not stand or sit up quickly, especially if you are an older patient. This reduces the risk of dizzy or fainting spells. Alcohol can make you more drowsy and dizzy. Avoid alcoholic drinks.  Do not treat yourself for coughs, colds or allergies without asking your doctor or health care professional for advice. Some ingredients can increase possible side effects.  Your mouth may get dry. Chewing sugarless gum or sucking hard candy, and drinking plenty of water will help.  What side effects may I notice from receiving this medicine?  Side effects that you should report to your doctor or health care professional as soon as possible:  -allergic reactions like skin rash, itching or hives, swelling of the face, lips, or tongue  -breathing problems  -chest pain  -dark urine  -fast, irregular heartbeat  -general ill feeling or flu-like symptoms  -high blood pressure  -males: prolonged or painful erection  -stomach pain or tenderness  -trouble passing urine or change in the amount of urine  -vomiting  -weight loss  -yellowing of the eyes or skin  Side effects that usually do not require medical attention (report to your doctor or health care professional if they continue or are bothersome):  -change in sex drive or performance  -constipation or diarrhea  -headache  -loss of appetite  -menstrual period irregularities  -nausea  -stomach upset  This list may not describe all possible side effects. Call your doctor for medical advice about side effects. You may report side effects to FDA at 1-800-FDA-1088.  Where should I keep my  medicine?  Keep out of the reach of children.  Store at room temperature between 15 and 30 degrees C (59 and 86 degrees F). Throw away any unused medication after the expiration date.  NOTE: This sheet is a summary. It may not cover all possible information. If you have questions about this medicine, talk to your doctor, pharmacist, or health care provider.     © 2016, [employer]. (2013-09-30 15:29:22)

## 2015-05-16 LAB — BASIC METABOLIC PANEL WITH GFR
BUN: 17 mg/dL (ref 7–25)
CO2: 27 mmol/L (ref 20–31)
CREATININE: 0.73 mg/dL (ref 0.50–1.05)
Calcium: 9.5 mg/dL (ref 8.6–10.4)
Chloride: 103 mmol/L (ref 98–110)
GFR, Est Non African American: >89 mL/min (ref >=60)
Glucose, Bld: 87 mg/dL (ref 65–99)
POTASSIUM: 4.3 mmol/L (ref 3.5–5.3)
Sodium: 141 mmol/L (ref 135–146)

## 2015-05-16 LAB — HEPATIC FUNCTION PANEL
ALK PHOS: 66 U/L (ref 33–130)
ALT: 13 U/L (ref 6–29)
AST: 14 U/L (ref 10–35)
Albumin: 4.3 g/dL (ref 3.6–5.1)
BILIRUBIN TOTAL: 0.3 mg/dL (ref 0.2–1.2)
Bilirubin, Direct: 0.1 mg/dL (ref <=0.2)
Indirect Bilirubin: 0.2 mg/dL (ref 0.2–1.2)
Total Protein: 6.9 g/dL (ref 6.1–8.1)

## 2015-05-16 LAB — CBC WITH DIFFERENTIAL/PLATELET
BASOS ABS: 0.1 10*3/uL (ref 0.0–0.1)
BASOS PCT: 1 % (ref 0–1)
EOS ABS: 0.2 10*3/uL (ref 0.0–0.7)
EOS PCT: 3 % (ref 0–5)
HCT: 40 % (ref 36.0–46.0)
Hemoglobin: 13.9 g/dL (ref 12.0–15.0)
LYMPHS ABS: 2.6 10*3/uL (ref 0.7–4.0)
Lymphocytes Relative: 31 % (ref 12–46)
MCH: 30.4 pg (ref 26.0–34.0)
MCHC: 34.8 g/dL (ref 30.0–36.0)
MCV: 87.5 fL (ref 78.0–100.0)
MPV: 10.4 fL (ref 8.6–12.4)
Monocytes Absolute: 0.6 10*3/uL (ref 0.1–1.0)
Monocytes Relative: 7 % (ref 3–12)
Neutro Abs: 4.8 10*3/uL (ref 1.7–7.7)
Neutrophils Relative %: 58 % (ref 43–77)
PLATELETS: 187 10*3/uL (ref 150–400)
RBC: 4.57 MIL/uL (ref 3.87–5.11)
RDW: 13.5 % (ref 11.5–15.5)
WBC: 8.3 10*3/uL (ref 4.0–10.5)

## 2015-08-14 ENCOUNTER — Ambulatory Visit: Payer: Self-pay | Admitting: Internal Medicine

## 2015-10-03 ENCOUNTER — Ambulatory Visit (INDEPENDENT_AMBULATORY_CARE_PROVIDER_SITE_OTHER): Payer: BC Managed Care – PPO | Admitting: Internal Medicine

## 2015-10-03 ENCOUNTER — Ambulatory Visit (HOSPITAL_COMMUNITY)
Admission: RE | Admit: 2015-10-03 | Discharge: 2015-10-03 | Disposition: A | Discharge status: Home / Self Care | Payer: BC Managed Care – PPO | Source: Ambulatory Visit | Attending: Internal Medicine | Admitting: Internal Medicine

## 2015-10-03 ENCOUNTER — Encounter: Payer: Self-pay | Admitting: Internal Medicine

## 2015-10-03 VITALS — BP 108/62 | HR 70 | Temp 98.2°F | Resp 18 | Ht 62.5 in | Wt 237.0 lb

## 2015-10-03 DIAGNOSIS — M25561 Pain in right knee: Secondary | ICD-10-CM

## 2015-10-03 MED ORDER — MELOXICAM 15 MG PO TABS: 15.0000 mg | ORAL_TABLET | Freq: Every day | ORAL | Status: DC

## 2015-10-03 NOTE — Progress Notes (Signed)
Subjective:    Patient ID: Whitney Herrera, female    DOB: 06-12-1955, 60 y.o.   MRN: [id number]  Knee Pain   Patient presents to the office for evaluation of right knee pain which has been going on for about a week.  No injury that she can think of. She reports that she has been on her feet a little bit more often and it is painful at rest.  She does have pain with standing and flexing and straightening.  She reports that she has never had issues with her knee in the past.  She does get some relief with advil and meloxicam which does help a little bit but she didn't have a lot of relief.  She has not noticed redness or swelling.  No popping or clicking.  No locking either.  She notes that she did drive 6 hours.  She did stop and got out several times.  No history of blood clots.  No estrogen, no recent surgeries, no cancer, no immobilization.  She has seen ortho in the past but the office she went to closed.      Review of Systems  Constitutional: Negative for fever, chills and fatigue.  Gastrointestinal: Negative for nausea and vomiting.  Musculoskeletal: Positive for myalgias and arthralgias. Negative for back pain and gait problem.  Skin: Negative for color change, rash and wound.       Objective:   Physical Exam  Constitutional: She is oriented to person, place, and time. She appears well-developed and well-nourished. No distress.  HENT:  Head: Normocephalic.  Mouth/Throat: Oropharynx is clear and moist. No oropharyngeal exudate.  Eyes: Conjunctivae are normal. No scleral icterus.  Neck: Normal range of motion. Neck supple. No JVD present. No thyromegaly present.  Cardiovascular: Normal rate, regular rhythm, normal heart sounds and intact distal pulses.  Exam reveals no gallop and no friction rub.   No murmur heard. Pulmonary/Chest: Effort normal and breath sounds normal. No respiratory distress. She has no wheezes. She has no rales. She exhibits no tenderness.  Musculoskeletal:       Right knee: She exhibits normal range of motion, no swelling, no effusion, no ecchymosis, no deformity, no laceration, no erythema, normal alignment, no LCL laxity, normal patellar mobility, no bony tenderness, normal meniscus and no MCL laxity. No tenderness found. No medial joint line, no lateral joint line, no MCL, no LCL and no patellar tendon tenderness noted.       Legs: Full active rom.  Mild pain with knee flexion.  Pain free active extension.  No jointline tenderness to palpation.  No palpable cords.  Negative holmans sign bilaterally.  Negative calf tenderness to palpation.    Lymphadenopathy:    She has no cervical adenopathy.  Neurological: She is alert and oriented to person, place, and time.  Skin: Skin is warm and dry. She is not diaphoretic.  Psychiatric: She has a normal mood and affect. Her behavior is normal. Judgment and thought content normal.  Nursing note and vitals reviewed.   Filed Vitals:   10/03/15 1519  BP: 108/62  Pulse: 70  Temp: 98.2 F (36.8 C)  Resp: 18          Assessment & Plan:    1. Right knee pain -IT band syndrome vs. Hamstring strain with possible underlying oa secondary to obesity.   -supplement meloxicam with tylenol -if no improvement in 2 weeks referral to ortho - meloxicam (MOBIC) 15 MG tablet; Take 1 tablet (15 mg total)  by mouth daily.  Dispense: 30 tablet; Refill: 1 - DG Knee Complete 4 Views Left; Future

## 2015-10-03 NOTE — Patient Instructions (Signed)
Osteoarthritis Osteoarthritis is a disease that causes soreness and inflammation of a joint. It occurs when the cartilage at the affected joint wears down. Cartilage acts as a cushion, covering the ends of bones where they meet to form a joint. Osteoarthritis is the most common form of arthritis. It often occurs in older people. The joints affected most often by this condition include those in the:  Ends of the fingers.  Thumbs.  Neck.  Lower back.  Knees.  Hips. CAUSES  Over time, the cartilage that covers the ends of bones begins to wear away. This causes bone to rub on bone, producing pain and stiffness in the affected joints.  RISK FACTORS Certain factors can increase your chances of having osteoarthritis, including:  Older age.  Excessive body weight.  Overuse of joints.  Previous joint injury. SIGNS AND SYMPTOMS   Pain, swelling, and stiffness in the joint.  Over time, the joint may lose its normal shape.  Small deposits of bone (osteophytes) may grow on the edges of the joint.  Bits of bone or cartilage can break off and float inside the joint space. This may cause more pain and damage. DIAGNOSIS  Your health care provider will do a physical exam and ask about your symptoms. Various tests may be ordered, such as:  X-rays of the affected joint.  Blood tests to rule out other types of arthritis. Additional tests may be used to diagnose your condition. TREATMENT  Goals of treatment are to control pain and improve joint function. Treatment plans may include:  A prescribed exercise program that allows for rest and joint relief.  A weight control plan.  Pain relief techniques, such as:  Properly applied heat and cold.  Electric pulses delivered to nerve endings under the skin (transcutaneous electrical nerve stimulation [TENS]).  Massage.  Certain nutritional supplements.  Medicines to control pain, such as:  Acetaminophen.  Nonsteroidal  anti-inflammatory drugs (NSAIDs), such as naproxen.  Narcotic or central-acting agents, such as tramadol.  Corticosteroids. These can be given orally or as an injection.  Surgery to reposition the bones and relieve pain (osteotomy) or to remove loose pieces of bone and cartilage. Joint replacement may be needed in advanced states of osteoarthritis. HOME CARE INSTRUCTIONS   Take medicines only as directed by your health care provider.  Maintain a healthy weight. Follow your health care provider's instructions for weight control. This may include dietary instructions.  Exercise as directed. Your health care provider can recommend specific types of exercise. These may include:  Strengthening exercises. These are done to strengthen the muscles that support joints affected by arthritis. They can be performed with weights or with exercise bands to add resistance.  Aerobic activities. These are exercises, such as brisk walking or low-impact aerobics, that get your heart pumping.  Range-of-motion activities. These keep your joints limber.  Balance and agility exercises. These help you maintain daily living skills.  Rest your affected joints as directed by your health care provider.  Keep all follow-up visits as directed by your health care provider. SEEK MEDICAL CARE IF:   Your skin turns red.  You develop a rash in addition to your joint pain.  You have worsening joint pain.  You have a fever along with joint or muscle aches. SEEK IMMEDIATE MEDICAL CARE IF:  You have a significant loss of weight or appetite.  You have night sweats. FOR MORE INFORMATION   National Institute of Arthritis and Musculoskeletal and Skin Diseases: www.niams.nih.gov  National Institute on   Aging: http://kim-miller.com/  American College of Rheumatology: www.rheumatology.org   This information is not intended to replace advice given to you by your health care provider. Make sure you discuss any questions you  have with your health care provider.   Document Released: 05/19/2005 Document Revised: 06/09/2014 Document Reviewed: 01/24/2013 Elsevier Interactive Patient Education 2016 [place].  Hamstring Strain With Rehab The hamstring muscle and tendons are vulnerable to muscle or tendon tear (strain). Hamstring tears cause pain and inflammation in the backside of the thigh, where the hamstring muscles are located. The hamstring is comprised of three muscles that are responsible for straightening the hip, bending the knee, and stabilizing the knee. These muscles are important for walking, running, and jumping. Hamstring strain is the most common injury of the thigh. Hamstring strains are classified as grade 1 or 2 strains. Grade 1 strains cause pain, but the tendon is not lengthened. Grade 2 strains include a lengthened ligament due to the ligament being stretched or partially ruptured. With grade 2 strains there is still function, although the function may be decreased.  SYMPTOMS   Pain, tenderness, swelling, warmth, or redness over the hamstring muscles, at the back of the thigh.  Pain that gets worse during and after intense activity.  A "pop" heard in the area, at the time of injury.  Muscle spasm in the hamstring muscles.  Pain or weakness with running, jumping, or bending the knee against resistance.  Crackling sound (crepitation) when the tendon is moved or touched.  Bruising (contusion) in the thigh within 48 hours of injury.  Loss of fullness of the muscle, or area of muscle bulging in the case of a complete rupture. CAUSES  A muscle strain occurs when a force is placed on the muscle or tendon that is greater than it can withstand. Common causes of injury include:  Strain from overuse or sudden increase in the frequency, duration, or intensity of activity.  Single violent blow or force to the back of the knee or the hamstring area of the thigh. RISK INCREASES WITH:  Sports that  require quick starts (sprinting, racquetball, tennis).  Sports that require jumping (basketball, volleyball).  Kicking sports and water skiing.  Contact sports (soccer, football).  Poor strength and flexibility.  Failure to warm up properly before activity.  Previous thigh, knee, or pelvis injury.  Poor exercise technique.  Poor posture. PREVENTION  Maintain physical fitness:  Strength, flexibility, and endurance.  Cardiovascular fitness.  Learn and use proper exercise technique and posture.  Wear proper fitted and padded protective equipment. PROGNOSIS  If treated properly, hamstring strains are usually curable in 2 to 6 weeks. RELATED COMPLICATIONS   Longer healing time, if not properly treated or if not given adequate time to heal.  Chronically inflamed tendon, causing persistent pain with activity that may progress to constant pain.  Recurring symptoms, if activity is resumed too soon.  Vulnerable to repeated injury (in up to 25% of cases). TREATMENT  Treatment first involves the use of ice and medication to help reduce pain and inflammation. It is also important to complete strengthening and stretching exercises, as well as modifying any activities that aggravate the symptoms. These exercises may be completed at home or with a therapist. Your caregiver may recommend the use of crutches to help reduce pain and discomfort, especially is the strain is severe enough to cause limping. If the tendon has pulled away from the bone, then surgery is necessary to reattach it. MEDICATION   If pain  medicine is needed, nonsteroidal anti-inflammatory medicines (aspirin and ibuprofen), or other minor pain relievers (acetaminophen), are often advised.  Do not take pain medicine for 7 days before surgery.  Prescription pain relievers may be given if your caregiver thinks they are needed. Use only as directed and only as much as you need.  Corticosteroid injections may be  recommended. However, these injections should only be used for serious cases, as they can only be given a certain number of times.  Ointments applied to the skin may be beneficial. HEAT AND COLD  Cold treatment (icing) relieves pain and reduces inflammation. Cold treatment should be applied for 10 to 15 minutes every 2 to 3 hours, and immediately after activity that aggravates your symptoms. Use ice packs or an ice massage.  Heat treatment may be used before performing the stretching and strengthening activities prescribed by your caregiver, physical therapist, or athletic trainer. Use a heat pack or a warm water soak. SEEK MEDICAL CARE IF:   Symptoms get worse or do not improve in 2 weeks, despite treatment.  New, unexplained symptoms develop. (Drugs used in treatment may produce side effects.) EXERCISES RANGE OF MOTION (ROM) AND STRETCHING EXERCISES - Hamstring Strain These exercises may help you when beginning to rehabilitate your injury. Your symptoms may go away with or without further involvement from your physician, physical therapist or athletic trainer. While completing these exercises, remember:   Restoring tissue flexibility helps normal motion to return to the joints. This allows healthier, less painful movement and activity.  An effective stretch should be held for at least 30 seconds.  A stretch should never be painful. You should only feel a gentle lengthening or release in the stretched tissue. STRETCH - Hamstrings, Standing  Stand or sit, and extend your right / left leg, placing your foot on a chair or foot stool.  Keep a slight arch in your low back and your hips straight forward.  Lead with your chest, and lean forward at the waist until you feel a gentle stretch in the back of your right / left knee or thigh. (When done correctly, this exercise requires leaning only a small distance.)  Hold this position for __________ seconds. Repeat __________ times. Complete  this stretch __________ times per day. STRETCH - Hamstrings, Supine   Lie on your back. Loop a belt or towel over the ball of your right / left foot.  Straighten your right / left knee and slowly pull on the belt to raise your leg. Do not allow the right / left knee to bend. Keep your opposite leg flat on the floor.  Raise the leg until you feel a gentle stretch behind your right / left knee or thigh. Hold this position for __________ seconds. Repeat __________ times. Complete this stretch __________ times per day.  STRETCH - Hamstrings, Doorway  Lie on your back with your right / left leg extended and resting on the wall, and the opposite leg flat on the ground through the door. Initially, position your bottom farther away from the wall.  Keep your right / left knee straight. If you feel a stretch behind your knee or thigh, hold this position for __________ seconds.  If you do not feel a stretch, scoot your bottom closer to the door and hold __________ seconds. Repeat __________ times. Complete this stretch __________ times per day.  STRETCH - Hamstrings/Adductors, V-Sit   Sit on the floor with your legs extended in a large "V," keeping your knees straight.  With your head and chest upright, bend at your waist reaching for your left foot to stretch your right thigh muscles.  You should feel a stretch in your right inner thigh. Hold for __________ seconds.  Return to the upright position to relax your leg muscles.  Continuing to keep your chest upright, bend straight forward at your waist to stretch your hamstrings.  You should feel a stretch behind both of your thighs and knees. Hold for __________ seconds.  Return to the upright position to relax your leg muscles.  With your head and chest upright, bend at your waist reaching for your right foot to stretch your left thigh muscles.  You should feel a stretch in your left inner thigh. Hold for __________ seconds.  Return to the  upright position to relax your leg muscles. Repeat __________ times. Complete this exercise __________ times per day.  STRENGTHENING EXERCISES - Hamstring Strain These exercises may help you when beginning to rehabilitate your injury. They may resolve your symptoms with or without further involvement from your physician, physical therapist or athletic trainer. While completing these exercises, remember:   Muscles can gain both the endurance and the strength needed for everyday activities through controlled exercises.  Complete these exercises as instructed by your physician, physical therapist or athletic trainer. Increase the resistance and repetitions only as guided.  You may experience muscle soreness or fatigue, but the pain or discomfort you are trying to eliminate should never get worse during these exercises. If this pain does get worse, stop and make certain you are following the directions exactly. If the pain is still present after adjustments, discontinue the exercise until you can discuss the trouble with your clinician. STRENGTH - Hip Extensors, Straight Leg Raises   Lie on your stomach on a firm surface.  Tense the muscles in your buttocks to lift your right / left leg about 4 inches. If you cannot lift your leg this high without arching your back, place a pillow under your hips.  Keep your knee straight. Hold for __________ seconds.  Slowly lower your leg to the starting position and allow it to relax completely before starting the next repetition. Repeat __________ times. Complete this exercise __________ times per day.  STRENGTH - Hamstring, Isometrics   Lie on your back on a firm surface.  Bend your right / left knee approximately __________ degrees.  Dig your heel into the surface, as if you are trying to pull it toward your buttocks. Tighten the muscles in the back of your thighs to "dig" as hard as you can, without increasing any pain.  Hold this position for  __________ seconds.  Release the tension gradually and allow your muscles to completely relax for __________ seconds between each exercise. Repeat __________ times. Complete this exercise __________ times per day.  STRENGTH - Hamstring, Curls   Lay on your stomach with your legs extended. (If you lay on a bed, your feet may hang over the edge.)  Tighten the muscles in the back of your thigh to bend your right / left knee up to 90 degrees. Keep your hips flat on the bed or floor.  Hold this position for __________ seconds.  Slowly lower your leg back to the starting position. Repeat __________ times. Complete this exercise __________ times per day.  OPTIONAL ANKLE WEIGHTS: Begin with ____________________, but DO NOT exceed ____________________. Increase in 1 pound/0.5 kilogram increments.   This information is not intended to replace advice given to you by your  health care provider. Make sure you discuss any questions you have with your health care provider.   Document Released: 05/19/2005 Document Revised: 06/09/2014 Document Reviewed: 08/31/2008 Elsevier Interactive Patient Education [employer].

## 2015-10-15 ENCOUNTER — Other Ambulatory Visit: Payer: Self-pay | Admitting: Internal Medicine

## 2015-10-15 DIAGNOSIS — M25569 Pain in unspecified knee: Secondary | ICD-10-CM

## 2015-11-22 ENCOUNTER — Other Ambulatory Visit: Payer: Self-pay | Admitting: Internal Medicine

## 2015-12-13 ENCOUNTER — Ambulatory Visit (INDEPENDENT_AMBULATORY_CARE_PROVIDER_SITE_OTHER): Payer: BC Managed Care – PPO | Admitting: Internal Medicine

## 2015-12-13 ENCOUNTER — Encounter: Payer: Self-pay | Admitting: Internal Medicine

## 2015-12-13 VITALS — BP 126/60 | HR 68 | Temp 98.2°F | Resp 18 | Ht 62.5 in | Wt 240.0 lb

## 2015-12-13 DIAGNOSIS — B009 Herpesviral infection, unspecified: Secondary | ICD-10-CM | POA: Diagnosis not present

## 2015-12-13 MED ORDER — ACYCLOVIR 400 MG PO TABS: 400.0000 mg | ORAL_TABLET | Freq: Three times a day (TID) | ORAL | Status: DC

## 2015-12-14 NOTE — Progress Notes (Signed)
   Subjective:    Patient ID: Whitney Herrera, female    DOB: [birth year], 60 y.o.   MRN: [id number]  Rash This is a new problem. The current episode started 1 to 4 weeks ago. The problem is unchanged. The affected locations include the face. The rash is characterized by blistering, itchiness and redness. She was exposed to nothing. Pertinent negatives include no congestion, cough, fatigue, fever, rhinorrhea, shortness of breath, sore throat or vomiting. Past treatments include nothing. (Past HSV 1 outbreak)      Review of Systems  Constitutional: Negative for fever and fatigue.  HENT: Negative for congestion, rhinorrhea and sore throat.   Respiratory: Negative for cough and shortness of breath.   Gastrointestinal: Negative for vomiting.  Skin: Positive for rash.       Objective:   Physical Exam  Constitutional: She appears well-developed and well-nourished. No distress.  HENT:  Head: Normocephalic.    Mouth/Throat: Oropharynx is clear and moist. No oropharyngeal exudate.  Eyes: Conjunctivae are normal. No scleral icterus.  Neck: Normal range of motion. Neck supple. No JVD present. No thyromegaly present.  Cardiovascular: Normal rate, regular rhythm, normal heart sounds and intact distal pulses.  Exam reveals no gallop and no friction rub.   No murmur heard. Pulmonary/Chest: Effort normal and breath sounds normal. No respiratory distress. She has no wheezes. She has no rales. She exhibits no tenderness.  Lymphadenopathy:    She has no cervical adenopathy.  Skin: She is not diaphoretic.  Nursing note and vitals reviewed.   Filed Vitals:   12/13/15 1626  BP: 126/60  Pulse: 68  Temp: 98.2 F (36.8 C)  Resp: 18          Assessment & Plan:    1. HSV-1 infection -acyclovir -avoid direct sun exposure -call if no relief.

## 2016-01-17 ENCOUNTER — Encounter: Payer: Self-pay | Admitting: *Deleted

## 2016-01-17 ENCOUNTER — Telehealth: Payer: Self-pay | Admitting: Internal Medicine

## 2016-01-17 DIAGNOSIS — L710 Perioral dermatitis: Secondary | ICD-10-CM

## 2016-01-17 MED ORDER — HYDROCORTISONE 1 % EX CREA: TOPICAL_CREAM | CUTANEOUS | 1 refills | Status: DC

## 2016-01-17 NOTE — Telephone Encounter (Signed)
Patient called on 01/16/16 reporting that the rash on her face was not improved.  We will try hydrocortisone OTC and will refer to dermatology.

## 2016-03-10 ENCOUNTER — Encounter: Payer: Self-pay | Admitting: Internal Medicine

## 2016-03-10 ENCOUNTER — Ambulatory Visit (INDEPENDENT_AMBULATORY_CARE_PROVIDER_SITE_OTHER): Payer: BC Managed Care – PPO | Admitting: Internal Medicine

## 2016-03-10 VITALS — BP 136/90 | HR 64 | Temp 97.2°F | Resp 16 | Ht 62.5 in | Wt 242.4 lb

## 2016-03-10 DIAGNOSIS — Z79899 Other long term (current) drug therapy: Secondary | ICD-10-CM

## 2016-03-10 DIAGNOSIS — I10 Essential (primary) hypertension: Secondary | ICD-10-CM

## 2016-03-10 DIAGNOSIS — Z0000 Encounter for general adult medical examination without abnormal findings: Secondary | ICD-10-CM | POA: Diagnosis not present

## 2016-03-10 DIAGNOSIS — E782 Mixed hyperlipidemia: Secondary | ICD-10-CM

## 2016-03-10 DIAGNOSIS — Z111 Encounter for screening for respiratory tuberculosis: Secondary | ICD-10-CM

## 2016-03-10 DIAGNOSIS — E559 Vitamin D deficiency, unspecified: Secondary | ICD-10-CM

## 2016-03-10 DIAGNOSIS — Z0001 Encounter for general adult medical examination with abnormal findings: Secondary | ICD-10-CM

## 2016-03-10 DIAGNOSIS — Z136 Encounter for screening for cardiovascular disorders: Secondary | ICD-10-CM | POA: Diagnosis not present

## 2016-03-10 DIAGNOSIS — Z1212 Encounter for screening for malignant neoplasm of rectum: Secondary | ICD-10-CM

## 2016-03-10 DIAGNOSIS — R7303 Prediabetes: Secondary | ICD-10-CM

## 2016-03-10 DIAGNOSIS — E538 Deficiency of other specified B group vitamins: Secondary | ICD-10-CM

## 2016-03-10 DIAGNOSIS — R5383 Other fatigue: Secondary | ICD-10-CM

## 2016-03-10 LAB — CBC WITH DIFFERENTIAL/PLATELET
BASOS ABS: 0 {cells}/uL (ref 0–200)
Basophils Relative: 0 %
EOS PCT: 3 %
Eosinophils Absolute: 291 cells/uL (ref 15–500)
HCT: 41.1 % (ref 35.0–45.0)
HEMOGLOBIN: 13.3 g/dL (ref 11.7–15.5)
LYMPHS ABS: 2716 {cells}/uL (ref 850–3900)
LYMPHS PCT: 28 %
MCH: 28.9 pg (ref 27.0–33.0)
MCHC: 32.4 g/dL (ref 32.0–36.0)
MCV: 89.3 fL (ref 80.0–100.0)
MONOS PCT: 7 %
MPV: 10.7 fL (ref 7.5–12.5)
Monocytes Absolute: 679 cells/uL (ref 200–950)
NEUTROS PCT: 62 %
Neutro Abs: 6014 cells/uL (ref 1500–7800)
Platelets: 215 10*3/uL (ref 140–400)
RBC: 4.6 MIL/uL (ref 3.80–5.10)
RDW: 13.5 % (ref 11.0–15.0)
WBC: 9.7 10*3/uL (ref 3.8–10.8)

## 2016-03-10 LAB — BASIC METABOLIC PANEL WITH GFR
BUN: 16 mg/dL (ref 7–25)
CALCIUM: 9.5 mg/dL (ref 8.6–10.4)
CO2: 27 mmol/L (ref 20–31)
CREATININE: 0.95 mg/dL (ref 0.50–0.99)
Chloride: 102 mmol/L (ref 98–110)
GFR, EST AFRICAN AMERICAN: 75 mL/min (ref >=60)
GFR, Est Non African American: 65 mL/min (ref >=60)
Glucose, Bld: 84 mg/dL (ref 65–99)
Potassium: 4 mmol/L (ref 3.5–5.3)
SODIUM: 140 mmol/L (ref 135–146)

## 2016-03-10 LAB — LIPID PANEL
CHOL/HDL RATIO: 4.9 ratio (ref <=5.0)
Cholesterol: 207 mg/dL — ABNORMAL HIGH (ref 125–200)
HDL: 42 mg/dL — AB (ref >=46)
LDL CALC: 112 mg/dL (ref <130)
Triglycerides: 267 mg/dL — ABNORMAL HIGH (ref <150)
VLDL: 53 mg/dL — ABNORMAL HIGH (ref <30)

## 2016-03-10 LAB — IRON AND TIBC
%SAT: 20 % (ref 11–50)
IRON: 81 ug/dL (ref 45–160)
TIBC: 407 ug/dL (ref 250–450)
UIBC: 326 ug/dL (ref 125–400)

## 2016-03-10 LAB — HEPATIC FUNCTION PANEL
ALT: 11 U/L (ref 6–29)
AST: 14 U/L (ref 10–35)
Albumin: 4.1 g/dL (ref 3.6–5.1)
Alkaline Phosphatase: 59 U/L (ref 33–130)
BILIRUBIN DIRECT: 0.1 mg/dL (ref <=0.2)
Indirect Bilirubin: 0.2 mg/dL (ref 0.2–1.2)
TOTAL PROTEIN: 6.9 g/dL (ref 6.1–8.1)
Total Bilirubin: 0.3 mg/dL (ref 0.2–1.2)

## 2016-03-10 LAB — TSH: TSH: 2.26 m[IU]/L

## 2016-03-10 LAB — MAGNESIUM: MAGNESIUM: 1.9 mg/dL (ref 1.5–2.5)

## 2016-03-10 LAB — VITAMIN B12: Vitamin B-12: 349 pg/mL (ref 200–1100)

## 2016-03-10 MED ORDER — PHENTERMINE HCL 37.5 MG PO TABS: ORAL_TABLET | ORAL | 2 refills | Status: DC

## 2016-03-10 NOTE — Progress Notes (Signed)
[facility] Unk Pinto, M.D.    Uvaldo Bristle. Silverio Lay, P.A.-C      Starlyn Skeans, P.A.-C  [facility]                [address] [place] [id number] Telephone [phone] Telefax [phone]  Annual Screening/Preventative Visit & Comprehensive Evaluation &  Examination     This very nice 60 y.o. MWF presents for a Screening/Preventative Visit & comprehensive evaluation and management of multiple medical co-morbidities.  Patient has been followed for HTN, Prediabetes, Hyperlipidemia and Vitamin D Deficiency.      HTN predates circa 2005. Patient's BP has been controlled at home and patient denies any cardiac symptoms as chest pain, palpitations, shortness of breath, dizziness or ankle swelling. Today's BP is borderline elevated at 136/90.      Patient's hyperlipidemia is controlled with diet and medications. Patient denies myalgias or other medication SE's. Last lipids were at goal: Lab Results  Component Value Date   CHOL 167 02/12/2015   HDL 48 02/12/2015   LDLCALC 94 02/12/2015   TRIG 124 02/12/2015   CHOLHDL 3.5 02/12/2015      Patient has Morbid Obesity (BMI 43+) and consequent prediabetes predating circa 2011 with A1c 5.9% , then 6.0% in 2012  and patient denies reactive hypoglycemic symptoms, visual blurring, diabetic polys, or paresthesias. She does admit very poor diet with Gluttonous overeating. Last A1c was near goal: Lab Results  Component Value Date   HGBA1C 5.8 (H) 02/12/2015      Finally, patient has history of Vitamin D Deficiency and last Vitamin D was still low: Lab Results  Component Value Date   VD25OH 45 02/12/2015   Current Outpatient Prescriptions on File Prior to Visit  Medication Sig  . atenolol (TENORMIN) 100 MG tablet Take 1 tablet (100 mg total) by mouth daily.  . Cholecalciferol (VITAMIN D PO) Take 5,000 Units by mouth daily.   Marland Kitchen escitalopram (LEXAPRO)  20 MG tablet TAKE 1 TABLET BY MOUTH DAILY.  . meloxicam (MOBIC) 15 MG tablet Take 1 tablet (15 mg total) by mouth daily.  . rosuvastatin (CRESTOR) 20 MG tablet TAKE 1 TABLET BY MOUTH DAILY FOR CHOLESTEROL.   No current facility-administered medications on file prior to visit.    Allergies  Allergen Reactions  . Lipitor [Atorvastatin]   . Wellbutrin [Bupropion]     dysphoria   Past Medical History:  Diagnosis Date  . B12 deficiency   . Hyperlipidemia   . Hypertension   . Obesity   . Prediabetes   . Vitamin D deficiency    Health Maintenance  Topic Date Due  . PAP SMEAR  01/24/1977  . MAMMOGRAM  11/30/2014  . INFLUENZA VACCINE  01/01/2016  . ZOSTAVAX  01/25/2016  . TETANUS/TDAP  04/17/2017  . COLONOSCOPY  01/15/2018  . Hepatitis C Screening  Completed  . HIV Screening  Completed   Immunization History  Administered Date(s) Administered  . Influenza-Unspecified 04/28/2015, 02/26/2016  . PPD Test 01/31/2014, 02/12/2015  . Pneumococcal-Unspecified 06/17/2008  . Td 04/18/2007   Past Surgical History:  Procedure Laterality Date  . APPENDECTOMY    . BREAST BIOPSY Right 1975  . INGUINAL HERNIA REPAIR Bilateral age 62   Family History  Problem Relation Age of Onset  . Hypertension Mother   . Alzheimer's disease Mother   . Hypertension Father   . Alzheimer's  disease Father    Social History  Substance Use Topics  . Smoking status: Never Smoker  . Smokeless tobacco: Never Used  . Alcohol use 1.2 oz/week    2 Standard drinks or equivalent per week     Comment: wine    ROS Constitutional: Denies fever, chills, weight loss/gain, headaches, insomnia,  night sweats, and change in appetite. Does c/o fatigue. Eyes: Denies redness, blurred vision, diplopia, discharge, itchy, watery eyes.  ENT: Denies discharge, congestion, post nasal drip, epistaxis, sore throat, earache, hearing loss, dental pain, Tinnitus, Vertigo, Sinus pain, snoring.  Cardio: Denies chest pain,  palpitations, irregular heartbeat, syncope, dyspnea, diaphoresis, orthopnea, PND, claudication, edema Respiratory: denies cough, dyspnea, DOE, pleurisy, hoarseness, laryngitis, wheezing.  Gastrointestinal: Denies dysphagia, heartburn, reflux, water brash, pain, cramps, nausea, vomiting, bloating, diarrhea, constipation, hematemesis, melena, hematochezia, jaundice, hemorrhoids Genitourinary: Denies dysuria, frequency, urgency, nocturia, hesitancy, discharge, hematuria, flank pain Breast: Breast lumps, nipple discharge, bleeding.  Musculoskeletal: Denies arthralgia, myalgia, stiffness, Jt. Swelling, pain, limp, and strain/sprain. Denies falls. Skin: Denies puritis, rash, hives, warts, acne, eczema, changing in skin lesion Neuro: No weakness, tremor, incoordination, spasms, paresthesia, pain Psychiatric: Denies confusion, memory loss, sensory loss. Denies Depression. Endocrine: Denies change in weight, skin, hair change, nocturia, and paresthesia, diabetic polys, visual blurring, hyper / hypo glycemic episodes.  Heme/Lymph: No excessive bleeding, bruising, enlarged lymph nodes.  Physical Exam  BP 136/90   Pulse 64   Temp 97.2 F (36.2 C)   Resp 16   Ht 5' 2.5" (1.588 m)   Wt 242 lb 6.4 oz (110 kg)   BMI 43.63 kg/m   General Appearance: Well nourished and in no apparent distress.  Eyes: PERRLA, EOMs, conjunctiva no swelling or erythema, normal fundi and vessels. Sinuses: No frontal/maxillary tenderness ENT/Mouth: EACs patent / TMs  nl. Nares clear without erythema, swelling, mucoid exudates. Oral hygiene is good. No erythema, swelling, or exudate. Tongue normal, non-obstructing. Tonsils not swollen or erythematous. Hearing normal.  Neck: Supple, thyroid normal. No bruits, nodes or JVD. Respiratory: Respiratory effort normal.  BS equal and clear bilateral without rales, rhonci, wheezing or stridor. Cardio: Heart sounds are normal with regular rate and rhythm and no murmurs, rubs or gallops.  Peripheral pulses are normal and equal bilaterally without edema. No aortic or femoral bruits. Chest: symmetric with normal excursions and percussion. Breasts: Symmetric, without lumps, nipple discharge, retractions, or fibrocystic changes.  Abdomen: Flat, soft with bowel sounds active. Nontender, no guarding, rebound, hernias, masses, or organomegaly.  Lymphatics: Non tender without lymphadenopathy.  Genitourinary: Deferred to GYN Laurin Coder, PA-C) who also does screening MGM's.  Musculoskeletal: Full ROM all peripheral extremities, joint stability, 5/5 strength, and normal gait. Skin: Warm and dry without rashes, lesions, cyanosis, clubbing or  ecchymosis.  Neuro: Cranial nerves intact, reflexes equal bilaterally. Normal muscle tone, no cerebellar symptoms. Sensation intact.  Pysch: Alert and oriented X 3, normal affect, Insight and Judgment appropriate.   Assessment and Plan  1. Annual Preventative Screening Examination  - Microalbumin / creatinine urine ratio - EKG 12-Lead - [place], RETROPERITNL ABD,  LTD - POC Hemoccult Bld/Stl  - Urinalysis, Routine w reflex microscopic - Iron and TIBC - CBC with Differential/Platelet - BASIC METABOLIC PANEL WITH GFR - Hepatic function panel - Magnesium - Lipid panel - TSH - Hemoglobin A1c - Insulin, random - VITAMIN D 25 Hydroxy  - Vitamin B12  2. Essential hypertension  - Microalbumin / creatinine urine ratio - EKG 12-Lead - [place], RETROPERITNL ABD,  LTD - TSH  3. Mixed hyperlipidemia  - EKG 12-Lead - [place], RETROPERITNL ABD,  LTD - Lipid panel - TSH  4. Prediabetes  - Long discussion re: importance of diet and given detailed dietary instructions.  - Patient did concede to a trial on Phentermine 37.5 mg at 1/2 -1 tab qd - EKG 12-Lead - [place], RETROPERITNL ABD,  LTD - Hemoglobin A1c - Insulin, random  5. Vitamin D deficiency  - VITAMIN D 25 Hydroxy   6. Screening for rectal cancer  - POC Hemoccult Bld/Stl  7. Screening  examination for pulmonary tuberculosis   8. Screening for ischemic heart disease  - EKG 12-Lead  9. Screening for AAA (aortic abdominal aneurysm)  - [place], RETROPERITNL ABD,  LTD  10. Other fatigue  - Iron and TIBC - CBC with Differential/Platelet - TSH - Vitamin B12  11. B12 deficiency  - Vitamin B12  12. Medication management  - Urinalysis, Routine w reflex microscopic - CBC with Differential/Platelet - BASIC METABOLIC PANEL WITH GFR - Hepatic function panel - Magnesium     Continue prudent diet as discussed, weight control, BP monitoring, regular exercise, and medications. Discussed med's effects and SE's. Screening labs and tests as requested with regular follow-up as recommended. Over 40 minutes of exam, counseling, chart review and high complex critical decision making was performed.

## 2016-03-10 NOTE — Patient Instructions (Signed)

## 2016-03-11 LAB — URINALYSIS, ROUTINE W REFLEX MICROSCOPIC
Bilirubin Urine: NEGATIVE
Glucose, UA: NEGATIVE
HGB URINE DIPSTICK: NEGATIVE
Ketones, ur: NEGATIVE
LEUKOCYTES UA: NEGATIVE
Nitrite: NEGATIVE
PROTEIN: NEGATIVE
Specific Gravity, Urine: 1.017 (ref 1.001–1.035)
pH: 6 (ref 5.0–8.0)

## 2016-03-11 LAB — HEMOGLOBIN A1C
Hgb A1c MFr Bld: 5.6 % (ref <5.7)
Mean Plasma Glucose: 114 mg/dL

## 2016-03-11 LAB — INSULIN, RANDOM: Insulin: 23.7 u[IU]/mL — ABNORMAL HIGH (ref 2.0–19.6)

## 2016-03-11 LAB — MICROALBUMIN / CREATININE URINE RATIO
Creatinine, Urine: 128 mg/dL (ref 20–320)
MICROALB/CREAT RATIO: 3 ug/mg{creat} (ref <30)
Microalb, Ur: 0.4 mg/dL

## 2016-03-11 LAB — VITAMIN D 25 HYDROXY (VIT D DEFICIENCY, FRACTURES): Vit D, 25-Hydroxy: 42 ng/mL (ref 30–100)

## 2016-03-20 ENCOUNTER — Ambulatory Visit (INDEPENDENT_AMBULATORY_CARE_PROVIDER_SITE_OTHER): Payer: BC Managed Care – PPO | Admitting: Internal Medicine

## 2016-03-20 ENCOUNTER — Encounter: Payer: Self-pay | Admitting: Internal Medicine

## 2016-03-20 VITALS — BP 136/82 | HR 70 | Temp 98.0°F | Resp 18 | Ht 62.5 in | Wt 245.0 lb

## 2016-03-20 DIAGNOSIS — J029 Acute pharyngitis, unspecified: Secondary | ICD-10-CM | POA: Diagnosis not present

## 2016-03-20 MED ORDER — FLUTICASONE PROPIONATE 50 MCG/ACT NA SUSP: 2.0000 | Freq: Every day | NASAL | 0 refills | Status: DC

## 2016-03-20 MED ORDER — PREDNISONE 20 MG PO TABS: ORAL_TABLET | ORAL | 0 refills | Status: DC

## 2016-03-20 NOTE — Patient Instructions (Signed)
Please take prednisone as prescribed for the next 5 days.  Please use flonase 2 sprays per nostril right before bedtime.  Look down at your feet and spray toward the top of crown of your skull.  Please take claritin, zyrtec, allegra.  Store brands and generics are okay.  Please take 25-50 mg of benadryl at bedtime to help clear out your ears.    You can also take sudafed up to 3 times daily over the counter.

## 2016-03-20 NOTE — Progress Notes (Signed)
HPI  Patient presents to the office for evaluation of sore throat, and throat clearing cough.  It has been going on for 4 days.  Patient reports dry, throat clearing cough.  They also endorse change in voice, postnasal drip and sore throat, right ear pain, and headaches.  .  They have tried none.  They report that nothing has worked.  They denies other sick contacts.  Review of Systems  Constitutional: Positive for malaise/fatigue. Negative for chills and fever.  HENT: Positive for congestion and sore throat. Negative for ear pain and hearing loss.   Respiratory: Positive for cough. Negative for sputum production, shortness of breath and wheezing.   Cardiovascular: Negative for chest pain, palpitations and leg swelling.  Neurological: Positive for headaches.    PE:  Vitals:   03/20/16 0855  BP: 136/82  Pulse: 70  Resp: 18  Temp: 98 F (36.7 C)    General:  Alert and non-toxic, WDWN, NAD HEENT: NCAT, PERLA, EOM normal, no occular discharge or erythema.  Nasal mucosal edema with sinus tenderness to palpation.  Oropharynx clear with minimal oropharyngeal edema and erythema.  Mucous membranes moist and pink. Neck:  Cervical adenopathy Chest:  RRR no MRGs.  Lungs clear to auscultation A&P with no wheezes rhonchi or rales.   Abdomen: +BS x 4 quadrants, soft, non-tender, no guarding, rigidity, or rebound. Skin: warm and dry no rash Neuro: A&Ox4, CN II-XII grossly intact  Assessment and Plan:   1. Acute pharyngitis, unspecified etiology -likely allergic rhinitis vs. Viral pharyngitis - predniSONE (DELTASONE) 20 MG tablet; 3 tabs po day one, then 2 tabs daily x 4 days  Dispense: 11 tablet; Refill: 0 - fluticasone (FLONASE) 50 MCG/ACT nasal spray; Place 2 sprays into both nostrils daily.  Dispense: 16 g; Refill: 0 -cont daily antihistamine

## 2016-03-24 ENCOUNTER — Ambulatory Visit (INDEPENDENT_AMBULATORY_CARE_PROVIDER_SITE_OTHER): Payer: BC Managed Care – PPO | Admitting: Internal Medicine

## 2016-03-24 ENCOUNTER — Encounter: Payer: Self-pay | Admitting: Internal Medicine

## 2016-03-24 VITALS — BP 128/80 | HR 68 | Temp 98.2°F | Resp 18 | Ht 62.5 in

## 2016-03-24 DIAGNOSIS — M79674 Pain in right toe(s): Secondary | ICD-10-CM | POA: Diagnosis not present

## 2016-03-24 NOTE — Patient Instructions (Signed)
Toe Fracture °A toe fracture is a break in one of the toe bones (phalanges). °CAUSES °This condition may be caused by: °· Dropping a heavy object on your toe. °· Stubbing your toe. °· Overusing your toe or doing repetitive exercise. °· Twisting or stretching your toe out of place. °RISK FACTORS °This condition is more likely to develop in people who: °· Play contact sports. °· Have a bone disease. °· Have a low calcium level. °SYMPTOMS °The main symptoms of this condition are swelling and pain in the toe. The pain may get worse with standing or walking. Other symptoms include: °· Bruising. °· Stiffness. °· Numbness. °· A change in the way the toe looks. °· Broken bones that poke through the skin. °· Blood beneath the toenail. °DIAGNOSIS °This condition is diagnosed with a physical exam. You may also have X-rays. °TREATMENT  °Treatment for this condition depends on the type of fracture and its severity. Treatment may involve: °· Taping the broken toe to a toe that is next to it (buddy taping). This is the most common treatment for fractures in which the bone has not moved out of place (nondisplaced fracture). °· Wearing a shoe that has a wide, rigid sole to protect the toe and to limit its movement. °· Wearing a walking cast. °· Having a procedure to move the toe back into place. °· Surgery. This may be needed: °¨ If there are many pieces of broken bone that are out of place (displaced). °¨ If the toe joint breaks. °¨ If the bone breaks through the skin. °· Physical therapy. This is done to help regain movement and strength in the toe. °You may need follow-up X-rays to make sure that the bone is healing well and staying in position. °HOME CARE INSTRUCTIONS °If You Have a Cast: °· Do not stick anything inside the cast to scratch your skin. Doing that increases your risk of infection. °· Check the skin around the cast every day. Report any concerns to your health care provider. You may put lotion on dry skin around the  edges of the cast. Do not apply lotion to the skin underneath the cast. °· Do not put pressure on any part of the cast until it is fully hardened. This may take several hours. °· Keep the cast clean and dry. °Bathing °· Do not take baths, swim, or use a hot tub until your health care provider approves. Ask your health care provider if you can take showers. You may only be allowed to take sponge baths for bathing. °· If your health care provider approves bathing and showering, cover the cast or bandage (dressing) with a watertight plastic bag to protect it from water. Do not let the cast or dressing get wet. °Managing Pain, Stiffness, and Swelling °· If you do not have a cast, apply ice to the injured area, if directed. °¨ Put ice in a plastic bag. °¨ Place a towel between your skin and the bag. °¨ Leave the ice on for 20 minutes, 2-3 times per day. °· Move your toes often to avoid stiffness and to lessen swelling. °· Raise (elevate) the injured area above the level of your heart while you are sitting or lying down. °Driving °· Do not drive or operate heavy machinery while taking pain medicine. °· Do not drive while wearing a cast on a foot that you use for driving. °Activity °· Return to your normal activities as directed by your health care provider. Ask your health care   provider what activities are safe for you. °· Perform exercises daily as directed by your health care provider or physical therapist. °Safety °· Do not use the injured limb to support your body weight until your health care provider says that you can. Use crutches or other assistive devices as directed by your health care provider. °General Instructions °· If your toe was treated with buddy taping, follow your health care provider's instructions for changing the gauze and tape. Change it more often: °¨ The gauze and tape get wet. If this happens, dry the space between the toes. °¨ The gauze and tape are too tight and cause your toe to become pale  or numb. °· Wear a protective shoe as directed by your health care provider. If you were not given a protective shoe, wear sturdy, supportive shoes. Your shoes should not pinch your toes and should not fit tightly against your toes. °· Do not use any tobacco products, including cigarettes, chewing tobacco, or e-cigarettes. Tobacco can delay bone healing. If you need help quitting, ask your health care provider. °· Take medicines only as directed by your health care provider. °· Keep all follow-up visits as directed by your health care provider. This is important. °SEEK MEDICAL CARE IF: °· You have a fever. °· Your pain medicine is not helping. °· Your toe is cold. °· Your toe is numb. °· You still have pain after one week of rest and treatment. °· You still have pain after your health care provider has said that you can start walking again. °· You have pain, tingling, or numbness in your foot that is not going away. °SEEK IMMEDIATE MEDICAL CARE IF: °· You have severe pain. °· You have redness or inflammation in your toe that is getting worse. °· You have pain or numbness in your toe that is getting worse. °· Your toe turns blue. °  °This information is not intended to replace advice given to you by your health care provider. Make sure you discuss any questions you have with your health care provider. °  °Document Released: 05/16/2000 Document Revised: 02/07/2015 Document Reviewed: 03/15/2014 °Elsevier Interactive Patient Education ©2016 [employer] ° °

## 2016-03-25 NOTE — Progress Notes (Signed)
   Subjective:    Patient ID: Whitney Herrera, female    DOB: [birth year], 60 y.o.   MRN: [id number]  HPI  Patient reports to the office for evaluation of her toe.  Patient reports that she was vaccuuming and dropped the handle of the vacuum on her toe.  She reports that she has had a lot of bruising and a chronic aching in her toe since that time.  She saw an occupation nurse at work who though the nail may need to have the nail trephinated.    Review of Systems  Constitutional: Negative for chills, fatigue and fever.  Musculoskeletal: Positive for arthralgias and joint swelling.  Skin: Positive for color change.       Objective:   Physical Exam  Constitutional: She appears well-developed and well-nourished. No distress.  HENT:  Head: Normocephalic.  Mouth/Throat: Oropharynx is clear and moist. No oropharyngeal exudate.  Eyes: Conjunctivae are normal.  Neck: Normal range of motion. Neck supple. No JVD present. No thyromegaly present.  Cardiovascular: Normal rate, regular rhythm and intact distal pulses.  Exam reveals no gallop and no friction rub.   No murmur heard. Pulmonary/Chest: Effort normal and breath sounds normal. No respiratory distress. She has no wheezes. She has no rales. She exhibits no tenderness.  Musculoskeletal:       Feet:  Lymphadenopathy:    She has no cervical adenopathy.  Skin: She is not diaphoretic.  Nursing note and vitals reviewed.         Assessment & Plan:    1. Pain of right great toe -bruising present, given no subungal hematoma there is no need for trephination -discussed that given no deformity that doing xray will not likely change the outcome of the treatment -offered tramadol prn for pain management -patient declined and will take tylenol or ibuprofen as needed.

## 2016-06-11 ENCOUNTER — Encounter: Payer: Self-pay | Admitting: Internal Medicine

## 2016-06-11 ENCOUNTER — Ambulatory Visit (INDEPENDENT_AMBULATORY_CARE_PROVIDER_SITE_OTHER): Payer: BC Managed Care – PPO | Admitting: Internal Medicine

## 2016-06-11 VITALS — BP 126/64 | HR 70 | Temp 98.2°F | Resp 18 | Ht 62.5 in | Wt 245.0 lb

## 2016-06-11 DIAGNOSIS — I10 Essential (primary) hypertension: Secondary | ICD-10-CM

## 2016-06-11 DIAGNOSIS — Z79899 Other long term (current) drug therapy: Secondary | ICD-10-CM

## 2016-06-11 DIAGNOSIS — E782 Mixed hyperlipidemia: Secondary | ICD-10-CM

## 2016-06-11 DIAGNOSIS — R7303 Prediabetes: Secondary | ICD-10-CM

## 2016-06-11 DIAGNOSIS — E559 Vitamin D deficiency, unspecified: Secondary | ICD-10-CM

## 2016-06-11 LAB — CBC WITH DIFFERENTIAL/PLATELET
BASOS ABS: 89 {cells}/uL (ref 0–200)
Basophils Relative: 1 %
EOS ABS: 356 {cells}/uL (ref 15–500)
EOS PCT: 4 %
HCT: 41.4 % (ref 35.0–45.0)
Hemoglobin: 13.5 g/dL (ref 11.7–15.5)
LYMPHS PCT: 29 %
Lymphs Abs: 2581 cells/uL (ref 850–3900)
MCH: 28.8 pg (ref 27.0–33.0)
MCHC: 32.6 g/dL (ref 32.0–36.0)
MCV: 88.3 fL (ref 80.0–100.0)
MPV: 10.4 fL (ref 7.5–12.5)
Monocytes Absolute: 534 cells/uL (ref 200–950)
Monocytes Relative: 6 %
NEUTROS ABS: 5340 {cells}/uL (ref 1500–7800)
Neutrophils Relative %: 60 %
PLATELETS: 218 10*3/uL (ref 140–400)
RBC: 4.69 MIL/uL (ref 3.80–5.10)
RDW: 14 % (ref 11.0–15.0)
WBC: 8.9 10*3/uL (ref 3.8–10.8)

## 2016-06-11 LAB — TSH: TSH: 2.16 mIU/L

## 2016-06-11 NOTE — Progress Notes (Signed)
Assessment and Plan:  Hypertension:  -cont meds  -well controlled -Continue medication,  -monitor blood pressure at home.  -Continue DASH diet.   -Reminder to go to the ER if any CP, SOB, nausea, dizziness, severe HA, changes vision/speech, left arm numbness and tingling, and jaw pain.  Cholesterol: -Continue diet and exercise.  -Check cholesterol.   Pre-diabetes: -Continue diet and exercise.  -Check A1C  Vitamin D Def: -check level -continue medications.   Obesity -cont phentermine -monitor BP -cont diet and exercise  Continue diet and meds as discussed. Further disposition pending results of labs.  HPI 61 y.o. female  presents for 3 month follow up with hypertension, hyperlipidemia, prediabetes and vitamin D.   Her blood pressure has been controlled at home, today their BP is BP: 126/64.   She does not workout. She denies chest pain, shortness of breath, dizziness.  She is starting to take phentermine again. She reports that she has been doing okay on it.  She is taking a 1/2 tablet daily.  She reports that she is doing fine with it.     She is on cholesterol medication and denies myalgias. Her cholesterol is at goal. The cholesterol last visit was:   Lab Results  Component Value Date   CHOL 207 (H) 03/10/2016   HDL 42 (L) 03/10/2016   LDLCALC 112 03/10/2016   TRIG 267 (H) 03/10/2016   CHOLHDL 4.9 03/10/2016     She has been working on diet and exercise for prediabetes, and denies foot ulcerations, hyperglycemia, hypoglycemia , increased appetite, nausea, paresthesia of the feet, polydipsia, polyuria, visual disturbances, vomiting and weight loss. Last A1C in the office was:  Lab Results  Component Value Date   HGBA1C 5.6 03/10/2016    Patient is on Vitamin D supplement.  Lab Results  Component Value Date   VD25OH 42 03/10/2016     She has no other complaints today.     Current Medications:  Current Outpatient Prescriptions on File Prior to Visit   Medication Sig Dispense Refill  . atenolol (TENORMIN) 100 MG tablet Take 1 tablet (100 mg total) by mouth daily. 90 tablet 3  . Cholecalciferol (VITAMIN D PO) Take 5,000 Units by mouth daily.     Marland Kitchen escitalopram (LEXAPRO) 20 MG tablet TAKE 1 TABLET BY MOUTH DAILY. 90 tablet 3  . rosuvastatin (CRESTOR) 20 MG tablet TAKE 1 TABLET BY MOUTH DAILY FOR CHOLESTEROL. 30 tablet PRN  . phentermine (ADIPEX-P) 37.5 MG tablet Take 1/2 to 1 tablet every morning for dieting & weightloss 30 tablet 2   No current facility-administered medications on file prior to visit.     Medical History:  Past Medical History:  Diagnosis Date  . B12 deficiency   . Hyperlipidemia   . Hypertension   . Obesity   . Prediabetes   . Vitamin D deficiency     Allergies:  Allergies  Allergen Reactions  . Lipitor [Atorvastatin]   . Wellbutrin [Bupropion]     dysphoria     Review of Systems:  Review of Systems  Constitutional: Negative for chills, fever and malaise/fatigue.  HENT: Negative for congestion, ear pain and sore throat.   Eyes: Negative.   Respiratory: Negative for cough, shortness of breath and wheezing.   Cardiovascular: Negative for chest pain, palpitations and leg swelling.  Gastrointestinal: Negative for abdominal pain, blood in stool, constipation, diarrhea, heartburn and melena.  Genitourinary: Negative.   Skin: Negative.   Neurological: Negative for dizziness, sensory change, loss of consciousness and  headaches.  Psychiatric/Behavioral: Negative for depression. The patient is not nervous/anxious and does not have insomnia.     Family history- Review and unchanged  Social history- Review and unchanged  Physical Exam: BP 126/64   Pulse 70   Temp 98.2 F (36.8 C) (Temporal)   Resp 18   Ht 5' 2.5" (1.588 m)   Wt 245 lb (111.1 kg)   BMI 44.10 kg/m  Wt Readings from Last 3 Encounters:  06/11/16 245 lb (111.1 kg)  03/20/16 245 lb (111.1 kg)  03/10/16 242 lb 6.4 oz (110 kg)     General Appearance:Obese, Well nourished well developed, in no apparent distress. Eyes: PERRLA, EOMs, conjunctiva no swelling or erythema ENT/Mouth: Ear canals normal without obstruction, swelling, erythma, discharge.  TMs normal bilaterally.  Oropharynx moist, clear, without exudate, or postoropharyngeal swelling. Neck: Supple, thyroid normal,no cervical adenopathy  Respiratory: Respiratory effort normal, Breath sounds clear A&P without rhonchi, wheeze, or rale.  No retractions, no accessory usage. Cardio: RRR with no MRGs. Brisk peripheral pulses without edema.  Abdomen: Soft, + BS,  Non tender, no guarding, rebound, hernias, masses. Musculoskeletal: Full ROM, 5/5 strength, Normal gait Skin: Warm, dry without rashes, lesions, ecchymosis.  Neuro: Awake and oriented X 3, Cranial nerves intact. Normal muscle tone, no cerebellar symptoms. Psych: Normal affect, Insight and Judgment appropriate.    Starlyn Skeans, PA-C 4:00 PM [facility]

## 2016-06-12 LAB — LIPID PANEL
Cholesterol: 216 mg/dL — ABNORMAL HIGH
HDL: 45 mg/dL — ABNORMAL LOW
LDL Cholesterol: 132 mg/dL — ABNORMAL HIGH
Total CHOL/HDL Ratio: 4.8 ratio
Triglycerides: 193 mg/dL — ABNORMAL HIGH
VLDL: 39 mg/dL — ABNORMAL HIGH

## 2016-06-12 LAB — BASIC METABOLIC PANEL WITH GFR
BUN: 20 mg/dL (ref 7–25)
CALCIUM: 9.9 mg/dL (ref 8.6–10.4)
CHLORIDE: 103 mmol/L (ref 98–110)
CO2: 29 mmol/L (ref 20–31)
CREATININE: 0.85 mg/dL (ref 0.50–0.99)
GFR, EST AFRICAN AMERICAN: 86 mL/min (ref >=60)
GFR, Est Non African American: 75 mL/min (ref >=60)
Glucose, Bld: 90 mg/dL (ref 65–99)
Potassium: 4.1 mmol/L (ref 3.5–5.3)
SODIUM: 141 mmol/L (ref 135–146)

## 2016-06-12 LAB — HEPATIC FUNCTION PANEL
ALBUMIN: 3.9 g/dL (ref 3.6–5.1)
ALT: 14 U/L (ref 6–29)
AST: 18 U/L (ref 10–35)
Alkaline Phosphatase: 67 U/L (ref 33–130)
BILIRUBIN DIRECT: 0.1 mg/dL (ref <=0.2)
BILIRUBIN TOTAL: 0.4 mg/dL (ref 0.2–1.2)
Indirect Bilirubin: 0.3 mg/dL (ref 0.2–1.2)
Total Protein: 6.6 g/dL (ref 6.1–8.1)

## 2016-06-12 LAB — HEMOGLOBIN A1C
Hgb A1c MFr Bld: 5.7 % — ABNORMAL HIGH (ref <5.7)
MEAN PLASMA GLUCOSE: 117 mg/dL

## 2016-07-07 ENCOUNTER — Other Ambulatory Visit: Payer: Self-pay | Admitting: Internal Medicine

## 2016-09-08 ENCOUNTER — Other Ambulatory Visit: Payer: Self-pay | Admitting: *Deleted

## 2016-09-08 ENCOUNTER — Other Ambulatory Visit: Payer: Self-pay | Admitting: Internal Medicine

## 2016-09-08 DIAGNOSIS — R7303 Prediabetes: Secondary | ICD-10-CM

## 2016-09-08 MED ORDER — PHENTERMINE HCL 37.5 MG PO TABS: ORAL_TABLET | ORAL | 5 refills | Status: DC

## 2016-09-16 ENCOUNTER — Ambulatory Visit: Payer: Self-pay | Admitting: Internal Medicine

## 2016-11-24 ENCOUNTER — Ambulatory Visit (INDEPENDENT_AMBULATORY_CARE_PROVIDER_SITE_OTHER): Payer: BC Managed Care – PPO | Admitting: Internal Medicine

## 2016-11-24 ENCOUNTER — Encounter: Payer: Self-pay | Admitting: Internal Medicine

## 2016-11-24 VITALS — BP 150/90 | HR 63 | Temp 97.2°F | Resp 16 | Ht 62.5 in | Wt 241.2 lb

## 2016-11-24 DIAGNOSIS — Z79899 Other long term (current) drug therapy: Secondary | ICD-10-CM | POA: Diagnosis not present

## 2016-11-24 DIAGNOSIS — K2100 Gastro-esophageal reflux disease with esophagitis, without bleeding: Secondary | ICD-10-CM

## 2016-11-24 DIAGNOSIS — E559 Vitamin D deficiency, unspecified: Secondary | ICD-10-CM

## 2016-11-24 DIAGNOSIS — R7303 Prediabetes: Secondary | ICD-10-CM

## 2016-11-24 DIAGNOSIS — E782 Mixed hyperlipidemia: Secondary | ICD-10-CM | POA: Diagnosis not present

## 2016-11-24 DIAGNOSIS — I10 Essential (primary) hypertension: Secondary | ICD-10-CM | POA: Diagnosis not present

## 2016-11-24 DIAGNOSIS — Z6841 Body Mass Index (BMI) 40.0 and over, adult: Secondary | ICD-10-CM

## 2016-11-24 LAB — CBC WITH DIFFERENTIAL/PLATELET
Basophils Absolute: 90 cells/uL (ref 0–200)
Basophils Relative: 1 %
Eosinophils Absolute: 270 cells/uL (ref 15–500)
Eosinophils Relative: 3 %
HCT: 41 % (ref 35.0–45.0)
Hemoglobin: 13 g/dL (ref 11.7–15.5)
LYMPHS PCT: 29 %
Lymphs Abs: 2610 cells/uL (ref 850–3900)
MCH: 28.2 pg (ref 27.0–33.0)
MCHC: 31.7 g/dL — AB (ref 32.0–36.0)
MCV: 88.9 fL (ref 80.0–100.0)
MPV: 10.4 fL (ref 7.5–12.5)
Monocytes Absolute: 450 cells/uL (ref 200–950)
Monocytes Relative: 5 %
NEUTROS PCT: 62 %
Neutro Abs: 5580 cells/uL (ref 1500–7800)
Platelets: 210 10*3/uL (ref 140–400)
RBC: 4.61 MIL/uL (ref 3.80–5.10)
RDW: 14 % (ref 11.0–15.0)
WBC: 9 10*3/uL (ref 3.8–10.8)

## 2016-11-24 LAB — TSH: TSH: 2.1 mIU/L

## 2016-11-24 LAB — BASIC METABOLIC PANEL WITH GFR
BUN: 20 mg/dL (ref 7–25)
CALCIUM: 9.6 mg/dL (ref 8.6–10.4)
CO2: 27 mmol/L (ref 20–31)
Chloride: 101 mmol/L (ref 98–110)
Creat: 0.74 mg/dL (ref 0.50–0.99)
GFR, EST NON AFRICAN AMERICAN: 88 mL/min (ref >=60)
Glucose, Bld: 104 mg/dL — ABNORMAL HIGH (ref 65–99)
Potassium: 4 mmol/L (ref 3.5–5.3)
SODIUM: 138 mmol/L (ref 135–146)

## 2016-11-24 LAB — LIPID PANEL
CHOLESTEROL: 212 mg/dL — AB (ref <200)
HDL: 44 mg/dL — ABNORMAL LOW (ref >50)
LDL Cholesterol: 126 mg/dL — ABNORMAL HIGH (ref <100)
TRIGLYCERIDES: 209 mg/dL — AB (ref <150)
Total CHOL/HDL Ratio: 4.8 Ratio (ref <5.0)
VLDL: 42 mg/dL — AB (ref <30)

## 2016-11-24 LAB — HEPATIC FUNCTION PANEL
ALT: 12 U/L (ref 6–29)
AST: 13 U/L (ref 10–35)
Albumin: 4.1 g/dL (ref 3.6–5.1)
Alkaline Phosphatase: 65 U/L (ref 33–130)
BILIRUBIN INDIRECT: 0.3 mg/dL (ref 0.2–1.2)
Bilirubin, Direct: 0.1 mg/dL (ref <=0.2)
TOTAL PROTEIN: 6.6 g/dL (ref 6.1–8.1)
Total Bilirubin: 0.4 mg/dL (ref 0.2–1.2)

## 2016-11-24 MED ORDER — BENAZEPRIL HCL 40 MG PO TABS: ORAL_TABLET | ORAL | 1 refills | Status: DC

## 2016-11-24 MED ORDER — RANITIDINE HCL 300 MG PO TABS: ORAL_TABLET | ORAL | 1 refills | Status: DC

## 2016-11-24 NOTE — Progress Notes (Signed)
This very nice 61 y.o. MWF presents for 6 month follow up with Hypertension, Hyperlipidemia, Pre-Diabetes and Vitamin D Deficiency.  She doe report recent post prandial heart burn and, water brash reflux .      Patient is treated for HTN (2005)  & BP has been controlled at home. Today's BP is not at goal at 150/88. Patient has had no complaints of any cardiac type chest pain, palpitations, dyspnea/orthopnea/PND, dizziness, claudication, or dependent edema.     Hyperlipidemia is not controlled with diet & meds. Patient denies myalgias or other med SE's. Last Lipids were not at goal: Lab Results  Component Value Date   CHOL 216 (H) 06/11/2016   HDL 45 (L) 06/11/2016   LDLCALC 132 (H) 06/11/2016   TRIG 193 (H) 06/11/2016   CHOLHDL 4.8 06/11/2016      Also, the patient has history of Morbid Obesity (BMI 43++) and consequent PreDiabetes (A1c 5.9% in 2011 and 6.0% in 2012) and has had no symptoms of reactive hypoglycemia, diabetic polys, paresthesias or visual blurring.  Last A1c was near goal: Lab Results  Component Value Date   HGBA1C 5.7 (H) 06/11/2016      Further, the patient also has history of Vitamin D Deficiency ("34" in 2013) and supplements vitamin D without any suspected side-effects. Last vitamin D was still low: Lab Results  Component Value Date   VD25OH 42 03/10/2016   Current Outpatient Prescriptions on File Prior to Visit  Medication Sig  . atenolol (TENORMIN) 100 MG tablet TAKE 1 TABLET (100 MG TOTAL) BY MOUTH DAILY.  Marland Kitchen Cholecalciferol (VITAMIN D PO) Take 5,000 Units by mouth daily.   Marland Kitchen escitalopram (LEXAPRO) 20 MG tablet TAKE 1 TABLET BY MOUTH DAILY.  . rosuvastatin (CRESTOR) 20 MG tablet TAKE 1 TABLET BY MOUTH DAILY FOR CHOLESTEROL.  Marland Kitchen phentermine (ADIPEX-P) 37.5 MG tablet Take 1/2 to 1 tablet every morning for dieting & weightloss   No current facility-administered medications on file prior to visit.    Allergies  Allergen Reactions  . Lipitor [Atorvastatin]     . Wellbutrin [Bupropion]     dysphoria   PMHx:   Past Medical History:  Diagnosis Date  . B12 deficiency   . Hyperlipidemia   . Hypertension   . Obesity   . Prediabetes   . Vitamin D deficiency    Immunization History  Administered Date(s) Administered  . Influenza-Unspecified 04/28/2015, 02/26/2016  . PPD Test 01/31/2014, 02/12/2015, 03/10/2016  . Pneumococcal-Unspecified 06/17/2008  . Td 04/18/2007   Past Surgical History:  Procedure Laterality Date  . APPENDECTOMY    . BREAST BIOPSY Right 1975  . INGUINAL HERNIA REPAIR Bilateral age 45   FHx:    Reviewed / unchanged  SHx:    Reviewed / unchanged  Systems Review:  Constitutional: Denies fever, chills, wt changes, headaches, insomnia, fatigue, night sweats, change in appetite. Eyes: Denies redness, blurred vision, diplopia, discharge, itchy, watery eyes.  ENT: Denies discharge, congestion, post nasal drip, epistaxis, sore throat, earache, hearing loss, dental pain, tinnitus, vertigo, sinus pain, snoring.  CV: Denies chest pain, palpitations, irregular heartbeat, syncope, dyspnea, diaphoresis, orthopnea, PND, claudication or edema. Respiratory: denies cough, dyspnea, DOE, pleurisy, hoarseness, laryngitis, wheezing.  Gastrointestinal: Denies dysphagia, odynophagia, heartburn, reflux, water brash, abdominal pain or cramps, nausea, vomiting, bloating, diarrhea, constipation, hematemesis, melena, hematochezia  or hemorrhoids. Genitourinary: Denies dysuria, frequency, urgency, nocturia, hesitancy, discharge, hematuria or flank pain. Musculoskeletal: Denies arthralgias, myalgias, stiffness, jt. swelling, pain, limping or strain/sprain.  Skin:  Denies pruritus, rash, hives, warts, acne, eczema or change in skin lesion(s). Neuro: No weakness, tremor, incoordination, spasms, paresthesia or pain. Psychiatric: Denies confusion, memory loss or sensory loss. Endo: Denies change in weight, skin or hair change.  Heme/Lymph: No excessive  bleeding, bruising or enlarged lymph nodes.  Physical Exam  BP (!) 150/90   Pulse 63   Temp 97.2 F (36.2 C)   Resp 16   Ht 5' 2.5" (1.588 m)   Wt 241 lb 3.2 oz (109.4 kg)   BMI 43.41 kg/m   Appears well nourished, well groomed  and in no distress.  Eyes: PERRLA, EOMs, conjunctiva no swelling or erythema. Sinuses: No frontal/maxillary tenderness ENT/Mouth: EAC's clear, TM's nl w/o erythema, bulging. Nares clear w/o erythema, swelling, exudates. Oropharynx clear without erythema or exudates. Oral hygiene is good. Tongue normal, non obstructing. Hearing intact.  Neck: Supple. Thyroid nl. Car 2+/2+ without bruits, nodes or JVD. Chest: Respirations nl with BS clear & equal w/o rales, rhonchi, wheezing or stridor.  Cor: Heart sounds normal w/ regular rate and rhythm without sig. murmurs, gallops, clicks or rubs. Peripheral pulses normal and equal  without edema.  Abdomen: Soft & bowel sounds normal. Non-tender w/o guarding, rebound, hernias, masses or organomegaly.  Lymphatics: Unremarkable.  Musculoskeletal: Full ROM all peripheral extremities, joint stability, 5/5 strength and normal gait.  Skin: Warm, dry without exposed rashes, lesions or ecchymosis apparent.  Neuro: Cranial nerves intact, reflexes equal bilaterally. Sensory-motor testing grossly intact. Tendon reflexes grossly intact.  Pysch: Alert & oriented x 3.  Insight and judgement nl & appropriate. No ideations.  Assessment and Plan:   1. Essential hypertension  - Continue medication, monitor blood pressure at home.  - Continue DASH diet. Reminder to go to the ER if any CP,  SOB, nausea, dizziness, severe HA, changes vision/speech  - CBC with Differential/Platelet - BASIC METABOLIC PANEL WITH GFR - Magnesium - TSH  Add - benazepril (LOTENSIN) 40 MG tablet; Take 1/2 to 1 tablet every morning for BP  Dispense: 90 tablet; Refill: 1  2. Hyperlipidemia, mixed  - Continue diet/meds, exercise,& lifestyle modifications.   - Continue monitor periodic cholesterol/liver & renal functions   - Hepatic function panel - Lipid panel - TSH  3. Prediabetes  - Continue diet, exercise, lifestyle modifications.  - Monitor appropriate labs. - Hemoglobin A1c - Insulin, random  4. Vitamin D deficiency  - Continue supplementation.  - VITAMIN D 25 Hydroxy   5. Medication management  - CBC with Differential/Platelet - BASIC METABOLIC PANEL WITH GFR - Hepatic function panel - Magnesium - Lipid panel - TSH - Hemoglobin A1c - Insulin, random - VITAMIN D 25 Hydroxy )  6. Gastroesophageal reflux disease with esophagitis  - ranitidine (ZANTAC) 300 MG tablet; Take 1 tablet w/supper for acid reflux  Dispense: 90 tablet; Refill: 1        Discussed  regular exercise, BP monitoring, weight control to achieve/maintain BMI less than 25 and discussed med and SE's. Recommended labs to assess and monitor clinical status with further disposition pending results of labs. Over 30 minutes of exam, counseling, chart review was performed.

## 2016-11-24 NOTE — Patient Instructions (Signed)

## 2016-11-25 ENCOUNTER — Other Ambulatory Visit: Payer: Self-pay | Admitting: Internal Medicine

## 2016-11-25 DIAGNOSIS — E782 Mixed hyperlipidemia: Secondary | ICD-10-CM

## 2016-11-25 LAB — HEMOGLOBIN A1C
Hgb A1c MFr Bld: 5.7 % — ABNORMAL HIGH (ref <5.7)
MEAN PLASMA GLUCOSE: 117 mg/dL

## 2016-11-25 LAB — INSULIN, RANDOM: Insulin: 47.6 u[IU]/mL — ABNORMAL HIGH (ref 2.0–19.6)

## 2016-11-25 LAB — VITAMIN D 25 HYDROXY (VIT D DEFICIENCY, FRACTURES): VIT D 25 HYDROXY: 37 ng/mL (ref 30–100)

## 2016-11-25 LAB — MAGNESIUM: Magnesium: 2 mg/dL (ref 1.5–2.5)

## 2016-11-25 MED ORDER — EZETIMIBE 10 MG PO TABS: ORAL_TABLET | ORAL | 1 refills | Status: DC

## 2017-01-20 ENCOUNTER — Other Ambulatory Visit: Payer: Self-pay | Admitting: Internal Medicine

## 2017-03-03 NOTE — Progress Notes (Signed)
Assessment and Plan:   Essential hypertension - continue medications, DASH diet, exercise and monitor at home. Call if greater than 130/80.  -     CBC with Differential/Platelet -     BASIC METABOLIC PANEL WITH GFR -     Hepatic function panel -     TSH  Hyperlipidemia, mixed -continue medications, check lipids, decrease fatty foods, increase activity.  -     Lipid panel  Prediabetes Discussed general issues about diabetes pathophysiology and management., Educational material distributed., Suggested low cholesterol diet., Encouraged aerobic exercise., Discussed foot care., Reminded to get yearly retinal exam. -     Hemoglobin A1c  Morbid obesity with BMI of 40.0-44.9, adult (HCC) -     lisdexamfetamine (VYVANSE) 30 MG capsule; Take 1 capsule (30 mg total) by mouth daily.  Medication management -     Magnesium  Other fatigue -     lisdexamfetamine (VYVANSE) 30 MG capsule; Take 1 capsule (30 mg total) by mouth daily.  Continue diet and meds as discussed. Further disposition pending results of labs.  HPI 61 y.o. female  presents for 3 month follow up with hypertension, hyperlipidemia, prediabetes and vitamin D. Her blood pressure has been controlled at home, she is just on atenolol not on benazepril, today their BP is BP: 122/80 She does not workout. She denies chest pain, shortness of breath, dizziness.  She is on cholesterol medication, crestor 20mg  QOD and denies myalgias. Her cholesterol is at goal. The cholesterol last visit was:   Lab Results  Component Value Date   CHOL 212 (H) 11/24/2016   HDL 44 (L) 11/24/2016   LDLCALC 126 (H) 11/24/2016   TRIG 209 (H) 11/24/2016   CHOLHDL 4.8 11/24/2016   She has been working on diet and exercise for prediabetes, and denies paresthesia of the feet, polydipsia, polyuria and visual disturbances. Last A1C in the office was:  Lab Results  Component Value Date   HGBA1C 5.7 (H) 11/24/2016   Patient is on Vitamin D supplement.   Lab  Results  Component Value Date   VD25OH 37 11/24/2016      She has sjogren's use to follow with Dr. Amil Amen but has not been a long time, denies problems.  She is on lexapro 20mg  which helps. Was on adderall but not anymore due to dry mouth and physical symptoms.  BMI is Body mass index is 42.73 kg/m., she is working on diet and exercise. Wt Readings from Last 3 Encounters:  03/04/17 237 lb 6.4 oz (107.7 kg)  11/24/16 241 lb 3.2 oz (109.4 kg)  06/11/16 245 lb (111.1 kg)    Current Medications:  Current Outpatient Prescriptions on File Prior to Visit  Medication Sig Dispense Refill  . atenolol (TENORMIN) 100 MG tablet TAKE 1 TABLET (100 MG TOTAL) BY MOUTH DAILY. 90 tablet 1  . Cholecalciferol (VITAMIN D PO) Take 5,000 Units by mouth daily.     Marland Kitchen escitalopram (LEXAPRO) 20 MG tablet TAKE 1 TABLET BY MOUTH DAILY. 90 tablet 4  . ranitidine (ZANTAC) 300 MG tablet Take 1 tablet w/supper for acid reflux 90 tablet 1  . rosuvastatin (CRESTOR) 20 MG tablet TAKE 1 TABLET BY MOUTH DAILY FOR CHOLESTEROL. 30 tablet PRN   No current facility-administered medications on file prior to visit.    Medical History:  Past Medical History:  Diagnosis Date  . B12 deficiency   . Hyperlipidemia   . Hypertension   . Obesity   . Prediabetes   . Vitamin D deficiency  Allergies:  Allergies  Allergen Reactions  . Lipitor [Atorvastatin]   . Wellbutrin [Bupropion]     dysphoria    Review of Systems:  Review of Systems  Constitutional: Negative for chills, diaphoresis, fever, malaise/fatigue and weight loss.  HENT: Positive for congestion and ear pain. Negative for ear discharge, hearing loss, nosebleeds, sinus pain, sore throat and tinnitus.   Eyes: Negative.   Respiratory: Negative.  Negative for stridor.   Cardiovascular: Negative.   Gastrointestinal: Negative for abdominal pain, blood in stool, constipation, diarrhea, heartburn, melena, nausea and vomiting.  Genitourinary: Negative.    Musculoskeletal: Negative.        Right foot heel pain  Skin: Negative.   Neurological: Negative.  Negative for weakness and headaches.  Psychiatric/Behavioral: Negative for depression, hallucinations, memory loss, substance abuse and suicidal ideas. The patient is not nervous/anxious and does not have insomnia.     Family history- Review and unchanged Social history- Review and unchanged Physical Exam: BP 122/80   Pulse 68   Temp 98.6 F (37 C)   Resp 16   Ht 5' 2.5" (1.588 m)   Wt 237 lb 6.4 oz (107.7 kg)   SpO2 98%   BMI 42.73 kg/m  Wt Readings from Last 3 Encounters:  03/04/17 237 lb 6.4 oz (107.7 kg)  11/24/16 241 lb 3.2 oz (109.4 kg)  06/11/16 245 lb (111.1 kg)   General Appearance: Well nourished, in no apparent distress. Eyes: PERRLA, EOMs, conjunctiva no swelling or erythema Sinuses: No Frontal/maxillary tenderness ENT/Mouth: Ext aud canals clear, TMs without erythema, bulging. No erythema, swelling, or exudate on post pharynx.  Tonsils not swollen or erythematous. Hearing normal.  Neck: Supple, thyroid normal.  Respiratory: Respiratory effort normal, BS equal bilaterally without rales, rhonchi, wheezing or stridor.  Cardio: RRR with no MRGs. Brisk peripheral pulses without edema.  Abdomen: Soft, + BS, obese  Non tender, no guarding, rebound, hernias, masses. Lymphatics: Non tender without lymphadenopathy.  Musculoskeletal: Full ROM, 5/5 strength, normal gait.  Skin: Warm, dry without rashes, lesions, ecchymosis.  Neuro: Cranial nerves intact. Normal muscle tone, no cerebellar symptoms. Sensation intact.  Psych: Awake and oriented X 3, normal affect, Insight and Judgment appropriate.    Vicie Mutters, PA-C 9:50 AM [facility]

## 2017-03-04 ENCOUNTER — Ambulatory Visit (INDEPENDENT_AMBULATORY_CARE_PROVIDER_SITE_OTHER): Payer: BC Managed Care – PPO | Admitting: Physician Assistant

## 2017-03-04 ENCOUNTER — Encounter: Payer: Self-pay | Admitting: Physician Assistant

## 2017-03-04 VITALS — BP 122/80 | HR 68 | Temp 98.6°F | Resp 16 | Ht 62.5 in | Wt 237.4 lb

## 2017-03-04 DIAGNOSIS — I10 Essential (primary) hypertension: Secondary | ICD-10-CM

## 2017-03-04 DIAGNOSIS — R5383 Other fatigue: Secondary | ICD-10-CM | POA: Diagnosis not present

## 2017-03-04 DIAGNOSIS — E782 Mixed hyperlipidemia: Secondary | ICD-10-CM

## 2017-03-04 DIAGNOSIS — Z6841 Body Mass Index (BMI) 40.0 and over, adult: Secondary | ICD-10-CM

## 2017-03-04 DIAGNOSIS — Z79899 Other long term (current) drug therapy: Secondary | ICD-10-CM | POA: Diagnosis not present

## 2017-03-04 DIAGNOSIS — R7303 Prediabetes: Secondary | ICD-10-CM | POA: Diagnosis not present

## 2017-03-04 MED ORDER — LISDEXAMFETAMINE DIMESYLATE 30 MG PO CAPS: 30.0000 mg | ORAL_CAPSULE | Freq: Every day | ORAL | 0 refills | Status: DC

## 2017-03-04 NOTE — Patient Instructions (Addendum)
Your ears and sinuses are connected by the eustachian tube. When your sinuses are inflamed, this can close off the tube and cause fluid to collect in your middle ear. This can then cause dizziness, popping, clicking, ringing, and echoing in your ears. This is often NOT an infection and does NOT require antibiotics, it is caused by inflammation so the treatments help the inflammation. This can take a long time to get better so please be patient.  Here are things you can do to help with this: - Try the Flonase or Nasonex. Remember to spray each nostril twice towards the outer part of your eye.  Do not sniff but instead pinch your nose and tilt your head back to help the medicine get into your sinuses.  The best time to do this is at bedtime.Stop if you get blurred vision or nose bleeds.  -While drinking fluids, pinch and hold nose close and swallow, to help open eustachian tubes to drain fluid behind ear drums. -Please pick one of the over the counter allergy medications below and take it once daily for allergies.  It will also help with fluid behind ear drums. Claritin or loratadine cheapest but likely the weakest  Zyrtec or certizine at night because it can make you sleepy The strongest is allegra or fexafinadine  Cheapest at [employer], [employer], [employer] -can use decongestant over the counter, please do not use if you have high blood pressure or certain heart conditions.   if worsening HA, changes vision/speech, imbalance, weakness go to the ER   Vionic brand for your feet I think it is possible that you have sleep apnea. It can cause interrupted sleep, headaches, frequent awakenings, fatigue, dry mouth, fast/slow heart beats, memory issues, anxiety/depression, swelling, numbness tingling hands/feet, weight gain, shortness of breath, and the list goes on. Sleep apnea needs to be ruled out because if it is left untreated it does eventually lead to abnormal heart beats, lung failure or heart failure as well  as increasing the risk of heart attack and stroke. There are masks you can wear OR a mouth piece that I can give you information about. Often times though people feel MUCH better after getting treatment.   Sleep Apnea  Sleep apnea is a sleep disorder characterized by abnormal pauses in breathing while you sleep. When your breathing pauses, the level of oxygen in your blood decreases. This causes you to move out of deep sleep and into light sleep. As a result, your quality of sleep is poor, and the system that carries your blood throughout your body (cardiovascular system) experiences stress. If sleep apnea remains untreated, the following conditions can develop:  High blood pressure (hypertension).  Coronary artery disease.  Impairment of your thought process (cognitive dysfunction). There are three types of sleep apnea: 1. Obstructive sleep apnea--Pauses in breathing during sleep because of a blocked airway. 2. Central sleep apnea--Pauses in breathing during sleep because the area of the brain that controls your breathing does not send the correct signals to the muscles that control breathing. 3. Mixed sleep apnea--A combination of both obstructive and central sleep apnea.  RISK FACTORS The following risk factors can increase your risk of developing sleep apnea:  Being overweight.  Smoking.  Having narrow passages in your nose and throat.  Being of older age.  Being female.  Alcohol use.  Sedative and tranquilizer use.  Ethnicity. Among individuals younger than 35 years, African Americans are at increased risk of sleep apnea. SYMPTOMS   Difficulty staying  asleep.  Daytime sleepiness and fatigue.  Loss of energy.  Irritability.  Loud, heavy snoring.  Morning headaches.  Trouble concentrating.  Forgetfulness.  Decreased interest in sex. DIAGNOSIS  In order to diagnose sleep apnea, your caregiver will perform a physical examination. Your caregiver may suggest that you  take a home sleep test. Your caregiver may also recommend that you spend the night in a sleep lab. In the sleep lab, several monitors record information about your heart, lungs, and brain while you sleep. Your leg and arm movements and blood oxygen level are also recorded. TREATMENT The following actions may help to resolve mild sleep apnea:  Sleeping on your side.   Using a decongestant if you have nasal congestion.   Avoiding the use of depressants, including alcohol, sedatives, and narcotics.   Losing weight and modifying your diet if you are overweight. There also are devices and treatments to help open your airway:  Oral appliances. These are custom-made mouthpieces that shift your lower jaw forward and slightly open your bite. This opens your airway.  Devices that create positive airway pressure. This positive pressure "splints" your airway open to help you breathe better during sleep. The following devices create positive airway pressure:  Continuous positive airway pressure (CPAP) device. The CPAP device creates a continuous level of air pressure with an air pump. The air is delivered to your airway through a mask while you sleep. This continuous pressure keeps your airway open.  Nasal expiratory positive airway pressure (EPAP) device. The EPAP device creates positive air pressure as you exhale. The device consists of single-use valves, which are inserted into each nostril and held in place by adhesive. The valves create very little resistance when you inhale but create much more resistance when you exhale. That increased resistance creates the positive airway pressure. This positive pressure while you exhale keeps your airway open, making it easier to breath when you inhale again.  Bilevel positive airway pressure (BPAP) device. The BPAP device is used mainly in patients with central sleep apnea. This device is similar to the CPAP device because it also uses an air pump to deliver  continuous air pressure through a mask. However, with the BPAP machine, the pressure is set at two different levels. The pressure when you exhale is lower than the pressure when you inhale.  Surgery. Typically, surgery is only done if you cannot comply with less invasive treatments or if the less invasive treatments do not improve your condition. Surgery involves removing excess tissue in your airway to create a wider passage way. Document Released: 05/09/2002 Document Revised: 09/13/2012 Document Reviewed: 09/25/2011 [facility] Patient Information 2015 [place], [place]. This information is not intended to replace advice given to you by your health care provider. Make sure you discuss any questions you have with your health care provider.

## 2017-03-05 LAB — HEMOGLOBIN A1C
HEMOGLOBIN A1C: 5.6 %{Hb} (ref <5.7)
MEAN PLASMA GLUCOSE: 114 (calc)
eAG (mmol/L): 6.3 (calc)

## 2017-03-05 LAB — LIPID PANEL
Cholesterol: 212 mg/dL — ABNORMAL HIGH (ref <200)
HDL: 48 mg/dL — ABNORMAL LOW (ref >50)
LDL CHOLESTEROL (CALC): 133 mg/dL — AB
NON-HDL CHOLESTEROL (CALC): 164 mg/dL — AB (ref <130)
TRIGLYCERIDES: 170 mg/dL — AB (ref <150)
Total CHOL/HDL Ratio: 4.4 (calc) (ref <5.0)

## 2017-03-05 LAB — HEPATIC FUNCTION PANEL
AG Ratio: 1.6 (calc) (ref 1.0–2.5)
ALT: 11 U/L (ref 6–29)
AST: 14 U/L (ref 10–35)
Albumin: 4.1 g/dL (ref 3.6–5.1)
Alkaline phosphatase (APISO): 74 U/L (ref 33–130)
BILIRUBIN DIRECT: 0.1 mg/dL (ref 0.0–0.2)
BILIRUBIN INDIRECT: 0.3 mg/dL (ref 0.2–1.2)
BILIRUBIN TOTAL: 0.4 mg/dL (ref 0.2–1.2)
Globulin: 2.6 g/dL (calc) (ref 1.9–3.7)
Total Protein: 6.7 g/dL (ref 6.1–8.1)

## 2017-03-05 LAB — CBC WITH DIFFERENTIAL/PLATELET
BASOS PCT: 0.6 %
Basophils Absolute: 49 cells/uL (ref 0–200)
Eosinophils Absolute: 267 cells/uL (ref 15–500)
Eosinophils Relative: 3.3 %
HCT: 39.4 % (ref 35.0–45.0)
Hemoglobin: 12.9 g/dL (ref 11.7–15.5)
Lymphs Abs: 2503 cells/uL (ref 850–3900)
MCH: 27.9 pg (ref 27.0–33.0)
MCHC: 32.7 g/dL (ref 32.0–36.0)
MCV: 85.3 fL (ref 80.0–100.0)
MONOS PCT: 7.1 %
MPV: 10.9 fL (ref 7.5–12.5)
Neutro Abs: 4706 cells/uL (ref 1500–7800)
Neutrophils Relative %: 58.1 %
PLATELETS: 249 10*3/uL (ref 140–400)
RBC: 4.62 10*6/uL (ref 3.80–5.10)
RDW: 12.7 % (ref 11.0–15.0)
TOTAL LYMPHOCYTE: 30.9 %
WBC mixed population: 575 cells/uL (ref 200–950)
WBC: 8.1 10*3/uL (ref 3.8–10.8)

## 2017-03-05 LAB — BASIC METABOLIC PANEL WITH GFR
BUN: 17 mg/dL (ref 7–25)
CHLORIDE: 104 mmol/L (ref 98–110)
CO2: 28 mmol/L (ref 20–32)
Calcium: 9.5 mg/dL (ref 8.6–10.4)
Creat: 0.79 mg/dL (ref 0.50–0.99)
GFR, EST AFRICAN AMERICAN: 94 mL/min/{1.73_m2} (ref >=60)
GFR, EST NON AFRICAN AMERICAN: 81 mL/min/{1.73_m2} (ref >=60)
Glucose, Bld: 92 mg/dL (ref 65–99)
POTASSIUM: 4.2 mmol/L (ref 3.5–5.3)
Sodium: 141 mmol/L (ref 135–146)

## 2017-03-05 LAB — TSH: TSH: 1.88 m[IU]/L (ref 0.40–4.50)

## 2017-03-05 LAB — MAGNESIUM: MAGNESIUM: 2.1 mg/dL (ref 1.5–2.5)

## 2017-04-20 ENCOUNTER — Other Ambulatory Visit: Payer: Self-pay | Admitting: Physician Assistant

## 2017-04-20 ENCOUNTER — Encounter: Payer: Self-pay | Admitting: Internal Medicine

## 2017-04-20 ENCOUNTER — Encounter: Payer: Self-pay | Admitting: Physician Assistant

## 2017-04-20 DIAGNOSIS — R5383 Other fatigue: Secondary | ICD-10-CM

## 2017-04-20 DIAGNOSIS — Z6841 Body Mass Index (BMI) 40.0 and over, adult: Principal | ICD-10-CM

## 2017-04-20 MED ORDER — LISDEXAMFETAMINE DIMESYLATE 30 MG PO CAPS: 30.0000 mg | ORAL_CAPSULE | Freq: Every day | ORAL | 0 refills | Status: DC

## 2017-06-01 ENCOUNTER — Telehealth: Payer: Self-pay | Admitting: *Deleted

## 2017-06-01 ENCOUNTER — Other Ambulatory Visit: Payer: Self-pay | Admitting: Internal Medicine

## 2017-06-01 MED ORDER — AMOXICILLIN 250 MG PO CAPS: ORAL_CAPSULE | ORAL | 0 refills | Status: DC

## 2017-06-01 NOTE — Telephone Encounter (Signed)
Patient informed Amoxicillin has been sent to her pharmacy by Dr Melford Aase for her URI.

## 2017-06-03 ENCOUNTER — Encounter: Payer: Self-pay | Admitting: Physician Assistant

## 2017-06-03 ENCOUNTER — Ambulatory Visit: Payer: BC Managed Care – PPO | Admitting: Physician Assistant

## 2017-06-03 VITALS — BP 136/68 | HR 76 | Temp 98.9°F | Wt 238.0 lb

## 2017-06-03 DIAGNOSIS — J069 Acute upper respiratory infection, unspecified: Secondary | ICD-10-CM

## 2017-06-03 MED ORDER — ALBUTEROL SULFATE HFA 108 (90 BASE) MCG/ACT IN AERS: 2.0000 | INHALATION_SPRAY | RESPIRATORY_TRACT | 0 refills | Status: DC | PRN

## 2017-06-03 MED ORDER — IPRATROPIUM-ALBUTEROL 0.5-2.5 (3) MG/3ML IN SOLN
3.0000 mL | Freq: Once | RESPIRATORY_TRACT | Status: AC
  Administered 2017-06-03: 3 mL via RESPIRATORY_TRACT

## 2017-06-03 MED ORDER — PREDNISONE 20 MG PO TABS: ORAL_TABLET | ORAL | 0 refills | Status: DC

## 2017-06-03 MED ORDER — PROMETHAZINE-DM 6.25-15 MG/5ML PO SYRP: 5.0000 mL | ORAL_SOLUTION | Freq: Four times a day (QID) | ORAL | 1 refills | Status: DC | PRN

## 2017-06-03 NOTE — Progress Notes (Signed)
Subjective:    Patient ID: Whitney Herrera, female    DOB: 1956-03-22, 62 y.o.   MRN: [id number]  HPI 62 y.o. obese WF presents with cough x 6 days.  She has chest congestion, wheezing, sinus congestion, low grade temperature 99.2 at home. She is on mucinex cough, nyquil, amoxicillin Monday from Dr. Melford Aase.   Blood pressure 136/68, pulse 76, temperature 98.9 F (37.2 C), weight 238 lb (108 kg), SpO2 93 %.  Medications Current Outpatient Medications on File Prior to Visit  Medication Sig  . amoxicillin (AMOXIL) 250 MG capsule Take 1 capsule 3 x /day for infection  . atenolol (TENORMIN) 100 MG tablet TAKE 1 TABLET (100 MG TOTAL) BY MOUTH DAILY.  Marland Kitchen Cholecalciferol (VITAMIN D PO) Take 5,000 Units by mouth daily.   Marland Kitchen escitalopram (LEXAPRO) 20 MG tablet TAKE 1 TABLET BY MOUTH DAILY.  Marland Kitchen lisdexamfetamine (VYVANSE) 30 MG capsule Take 1 capsule (30 mg total) daily by mouth.  . rosuvastatin (CRESTOR) 20 MG tablet TAKE 1 TABLET BY MOUTH DAILY FOR CHOLESTEROL.  Marland Kitchen ranitidine (ZANTAC) 300 MG tablet Take 1 tablet w/supper for acid reflux   No current facility-administered medications on file prior to visit.     Problem list She has Hypertension; Hyperlipidemia; Morbid obesity (BMI 41.66) ; B12 deficiency; Vitamin D deficiency; Prediabetes; Medication management; and Sjoegren syndrome ([place]) on their problem list.   Review of Systems  Constitutional: Positive for fatigue. Negative for chills and fever.  HENT: Positive for congestion, rhinorrhea, sinus pressure and sore throat. Negative for dental problem, ear discharge, ear pain, nosebleeds, trouble swallowing and voice change.   Respiratory: Positive for cough, chest tightness, shortness of breath and wheezing.   Cardiovascular: Negative.   Gastrointestinal: Negative.   Genitourinary: Negative.   Musculoskeletal: Negative.   Neurological: Negative.        Objective:   Physical Exam  Constitutional: She is oriented to person, place, and  time. She appears well-developed and well-nourished.  HENT:  Head: Normocephalic and atraumatic.  Right Ear: External ear normal.  Left Ear: External ear normal.  Nose: Nose normal.  Mouth/Throat: Oropharynx is clear and moist.  Eyes: Conjunctivae are normal. Pupils are equal, round, and reactive to light.  Neck: Normal range of motion. Neck supple.  Cardiovascular: Normal rate and regular rhythm.  Pulmonary/Chest: Effort normal. No respiratory distress. She has wheezes. She has no rales. She exhibits no tenderness.  Abdominal: Soft. Bowel sounds are normal.  Lymphadenopathy:    She has no cervical adenopathy.  Neurological: She is alert and oriented to person, place, and time.  Skin: Skin is warm and dry.       Assessment & Plan:   Upper respiratory tract infection, unspecified type -     ipratropium-albuterol (DUONEB) 0.5-2.5 (3) MG/3ML nebulizer solution 3 mL -     promethazine-dextromethorphan (PROMETHAZINE-DM) 6.25-15 MG/5ML syrup; Take 5 mLs by mouth 4 (four) times daily as needed for cough. -     albuterol (VENTOLIN HFA) 108 (90 Base) MCG/ACT inhaler; Inhale 2 puffs into the lungs every 4 (four) hours as needed for wheezing or shortness of breath. -     predniSONE (DELTASONE) 20 MG tablet; 2 tablets daily for 3 days, 1 tablet daily for 4 days.  The patient was advised to call immediately if she has any concerning symptoms in the interval. The patient voices understanding of current treatment options and is in agreement with the current care plan.The patient knows to call the clinic with any problems, questions  or concerns or go to the ER if any further progression of symptoms.

## 2017-06-03 NOTE — Patient Instructions (Signed)

## 2017-06-18 ENCOUNTER — Ambulatory Visit: Payer: BC Managed Care – PPO | Admitting: Internal Medicine

## 2017-06-18 VITALS — BP 138/84 | HR 72 | Temp 97.6°F | Resp 18 | Ht 62.5 in | Wt 243.2 lb

## 2017-06-18 DIAGNOSIS — Z1211 Encounter for screening for malignant neoplasm of colon: Secondary | ICD-10-CM

## 2017-06-18 DIAGNOSIS — Z79899 Other long term (current) drug therapy: Secondary | ICD-10-CM

## 2017-06-18 DIAGNOSIS — Z6841 Body Mass Index (BMI) 40.0 and over, adult: Secondary | ICD-10-CM

## 2017-06-18 DIAGNOSIS — Z136 Encounter for screening for cardiovascular disorders: Secondary | ICD-10-CM

## 2017-06-18 DIAGNOSIS — Z23 Encounter for immunization: Secondary | ICD-10-CM

## 2017-06-18 DIAGNOSIS — I10 Essential (primary) hypertension: Secondary | ICD-10-CM | POA: Diagnosis not present

## 2017-06-18 DIAGNOSIS — Z111 Encounter for screening for respiratory tuberculosis: Secondary | ICD-10-CM | POA: Diagnosis not present

## 2017-06-18 DIAGNOSIS — E559 Vitamin D deficiency, unspecified: Secondary | ICD-10-CM

## 2017-06-18 DIAGNOSIS — Z1212 Encounter for screening for malignant neoplasm of rectum: Secondary | ICD-10-CM

## 2017-06-18 DIAGNOSIS — Z0001 Encounter for general adult medical examination with abnormal findings: Secondary | ICD-10-CM

## 2017-06-18 DIAGNOSIS — E782 Mixed hyperlipidemia: Secondary | ICD-10-CM

## 2017-06-18 DIAGNOSIS — R5383 Other fatigue: Secondary | ICD-10-CM

## 2017-06-18 DIAGNOSIS — Z0000 Encounter for general adult medical examination without abnormal findings: Secondary | ICD-10-CM | POA: Diagnosis not present

## 2017-06-18 DIAGNOSIS — R7303 Prediabetes: Secondary | ICD-10-CM

## 2017-06-18 MED ORDER — LISDEXAMFETAMINE DIMESYLATE 30 MG PO CAPS: 30.0000 mg | ORAL_CAPSULE | Freq: Every day | ORAL | 0 refills | Status: DC

## 2017-06-18 MED ORDER — LISDEXAMFETAMINE DIMESYLATE 30 MG PO CAPS: ORAL_CAPSULE | ORAL | 0 refills | Status: DC

## 2017-06-18 NOTE — Progress Notes (Signed)
138 

## 2017-06-18 NOTE — Patient Instructions (Signed)
Preventive Care for Adults  A healthy lifestyle and preventive care can promote health and wellness. Preventive health guidelines for men include the following key practices:  A routine yearly physical is a good way to check with your health care provider about your health and preventative screening. It is a chance to share any concerns and updates on your health and to receive a thorough exam.  Visit your dentist for a routine exam and preventative care every 6 months. Brush your teeth twice a day and floss once a day. Good oral hygiene prevents tooth decay and gum disease.  The frequency of eye exams is based on your age, health, family medical history, use of contact lenses, and other factors. Follow your health care provider's recommendations for frequency of eye exams.  Eat a healthy diet. Foods such as vegetables, fruits, whole grains, low-fat dairy products, and lean protein foods contain the nutrients you need without too many calories. Decrease your intake of foods high in solid fats, added sugars, and salt. Eat the right amount of calories for you.Get information about a proper diet from your health care provider, if necessary.  Regular physical exercise is one of the most important things you can do for your health. Most adults should get at least 150 minutes of moderate-intensity exercise (any activity that increases your heart rate and causes you to sweat) each week. In addition, most adults need muscle-strengthening exercises on 2 or more days a week.  Maintain a healthy weight. The body mass index (BMI) is a screening tool to identify possible weight problems. It provides an estimate of body fat based on height and weight. Your health care provider can find your BMI and can help you achieve or maintain a healthy weight.For adults 20 years and older:  A BMI below 18.5 is considered underweight.  A BMI of 18.5 to 24.9 is normal.  A BMI of 25 to 29.9 is considered overweight.  A  BMI of 30 and above is considered obese.  Maintain normal blood lipids and cholesterol levels by exercising and minimizing your intake of saturated fat. Eat a balanced diet with plenty of fruit and vegetables. Blood tests for lipids and cholesterol should begin at age 20 and be repeated every 5 years. If your lipid or cholesterol levels are high, you are over 50, or you are at high risk for heart disease, you may need your cholesterol levels checked more frequently.Ongoing high lipid and cholesterol levels should be treated with medicines if diet and exercise are not working.  If you smoke, find out from your health care provider how to quit. If you do not use tobacco, do not start.  Lung cancer screening is recommended for adults aged 55-80 years who are at high risk for developing lung cancer because of a history of smoking. A yearly low-dose CT scan of the lungs is recommended for people who have at least a 30-pack-year history of smoking and are a current smoker or have quit within the past 15 years. A pack year of smoking is smoking an average of 1 pack of cigarettes a day for 1 year (for example: 1 pack a day for 30 years or 2 packs a day for 15 years). Yearly screening should continue until the smoker has stopped smoking for at least 15 years. Yearly screening should be stopped for people who develop a health problem that would prevent them from having lung cancer treatment.  If you choose to drink alcohol, do not have more   than 2 drinks per day. One drink is considered to be 12 ounces (355 mL) of beer, 5 ounces (148 mL) of wine, or 1.5 ounces (44 mL) of liquor.  Avoid use of street drugs. Do not share needles with anyone. Ask for help if you need support or instructions about stopping the use of drugs.  High blood pressure causes heart disease and increases the risk of stroke. Your blood pressure should be checked at least every 1-2 years. Ongoing high blood pressure should be treated with  medicines, if weight loss and exercise are not effective.  If you are 45-79 years old, ask your health care provider if you should take aspirin to prevent heart disease.  Diabetes screening involves taking a blood sample to check your fasting blood sugar level. This should be done once every 3 years, after age 45, if you are within normal weight and without risk factors for diabetes. Testing should be considered at a younger age or be carried out more frequently if you are overweight and have at least 1 risk factor for diabetes.  Colorectal cancer can be detected and often prevented. Most routine colorectal cancer screening begins at the age of 50 and continues through age 75. However, your health care provider may recommend screening at an earlier age if you have risk factors for colon cancer. On a yearly basis, your health care provider may provide home test kits to check for hidden blood in the stool. Use of a small camera at the end of a tube to directly examine the colon (sigmoidoscopy or colonoscopy) can detect the earliest forms of colorectal cancer. Talk to your health care provider about this at age 50, when routine screening begins. Direct exam of the colon should be repeated every 5-10 years through age 75, unless early forms of precancerous polyps or small growths are found.   Talk with your health care provider about prostate cancer screening.  Testicular cancer screening isrecommended for adult males. Screening includes self-exam, a health care provider exam, and other screening tests. Consult with your health care provider about any symptoms you have or any concerns you have about testicular cancer.  Use sunscreen. Apply sunscreen liberally and repeatedly throughout the day. You should seek shade when your shadow is shorter than you. Protect yourself by wearing long sleeves, pants, a wide-brimmed hat, and sunglasses year round, whenever you are outdoors.  Once a month, do a whole-body  skin exam, using a mirror to look at the skin on your back. Tell your health care provider about new moles, moles that have irregular borders, moles that are larger than a pencil eraser, or moles that have changed in shape or color.  Stay current with required vaccines (immunizations).  Influenza vaccine. All adults should be immunized every year.  Tetanus, diphtheria, and acellular pertussis (Td, Tdap) vaccine. An adult who has not previously received Tdap or who does not know his vaccine status should receive 1 dose of Tdap. This initial dose should be followed by tetanus and diphtheria toxoids (Td) booster doses every 10 years. Adults with an unknown or incomplete history of completing a 3-dose immunization series with Td-containing vaccines should begin or complete a primary immunization series including a Tdap dose. Adults should receive a Td booster every 10 years.  Varicella vaccine. An adult without evidence of immunity to varicella should receive 2 doses or a second dose if he has previously received 1 dose.  Human papillomavirus (HPV) vaccine. Males aged 13-21 years who have not   received the vaccine previously should receive the 3-dose series. Males aged 22-26 years may be immunized. Immunization is recommended through the age of 26 years for any female who has sex with males and did not get any or all doses earlier. Immunization is recommended for any person with an immunocompromised condition through the age of 26 years if he did not get any or all doses earlier. During the 3-dose series, the second dose should be obtained 4-8 weeks after the first dose. The third dose should be obtained 24 weeks after the first dose and 16 weeks after the second dose.  Zoster vaccine. One dose is recommended for adults aged 60 years or older unless certain conditions are present.    PREVNAR  - Pneumococcal 13-valent conjugate (PCV13) vaccine. When indicated, a person who is uncertain of his immunization  history and has no record of immunization should receive the PCV13 vaccine. An adult aged 19 years or older who has certain medical conditions and has not been previously immunized should receive 1 dose of PCV13 vaccine. This PCV13 should be followed with a dose of pneumococcal polysaccharide (PPSV23) vaccine. The PPSV23 vaccine dose should be obtained at least 8 weeks after the dose of PCV13 vaccine. An adult aged 19 years or older who has certain medical conditions and previously received 1 or more doses of PPSV23 vaccine should receive 1 dose of PCV13. The PCV13 vaccine dose should be obtained 1 or more years after the last PPSV23 vaccine dose.    PNEUMOVAX - Pneumococcal polysaccharide (PPSV23) vaccine. When PCV13 is also indicated, PCV13 should be obtained first. All adults aged 65 years and older should be immunized. An adult younger than age 65 years who has certain medical conditions should be immunized. Any person who resides in a nursing home or long-term care facility should be immunized. An adult smoker should be immunized. People with an immunocompromised condition and certain other conditions should receive both PCV13 and PPSV23 vaccines. People with human immunodeficiency virus (HIV) infection should be immunized as soon as possible after diagnosis. Immunization during chemotherapy or radiation therapy should be avoided. Routine use of PPSV23 vaccine is not recommended for American Indians, [place] Natives, or people younger than 65 years unless there are medical conditions that require PPSV23 vaccine. When indicated, people who have unknown immunization and have no record of immunization should receive PPSV23 vaccine. One-time revaccination 5 years after the first dose of PPSV23 is recommended for people aged 19-64 years who have chronic kidney failure, nephrotic syndrome, asplenia, or immunocompromised conditions. People who received 1-2 doses of PPSV23 before age 65 years should receive another  dose of PPSV23 vaccine at age 65 years or later if at least 5 years have passed since the previous dose. Doses of PPSV23 are not needed for people immunized with PPSV23 at or after age 65 years.    Hepatitis A vaccine. Adults who wish to be protected from this disease, have certain high-risk conditions, work with hepatitis A-infected animals, work in hepatitis A research labs, or travel to or work in countries with a high rate of hepatitis A should be immunized. Adults who were previously unvaccinated and who anticipate close contact with an international adoptee during the first 60 days after arrival in the [place] from a country with a high rate of hepatitis A should be immunized.    Hepatitis B vaccine. Adults should be immunized if they wish to be protected from this disease, have certain high-risk conditions, may be exposed to   blood or other infectious body fluids, are household contacts or sex partners of hepatitis B positive people, are clients or workers in certain care facilities, or travel to or work in countries with a high rate of hepatitis B.   Preventive Service / Frequency   Ages 40 to 64  Blood pressure check.  Lipid and cholesterol check  Lung cancer screening. / Every year if you are aged 55-80 years and have a 30-pack-year history of smoking and currently smoke or have quit within the past 15 years. Yearly screening is stopped once you have quit smoking for at least 15 years or develop a health problem that would prevent you from having lung cancer treatment.  Fecal occult blood test (FOBT) of stool. / Every year beginning at age 50 and continuing until age 75. You may not have to do this test if you get a colonoscopy every 10 years.  Flexible sigmoidoscopy** or colonoscopy.** / Every 5 years for a flexible sigmoidoscopy or every 10 years for a colonoscopy beginning at age 50 and continuing until age 75. Screening for abdominal aortic aneurysm (AAA)  by ultrasound is  recommended for people who have history of high blood pressure or who are current or former smokers. +++++++++++ Recommend Adult Low Dose Aspirin or  coated  Aspirin 81 mg daily  To reduce risk of Colon Cancer 20 %,  Skin Cancer 26 % ,  Melanoma 46%  and  Pancreatic cancer 60% ++++++++++++++++++++ Vitamin D goal  is between 70-100.  Please make sure that you are taking your Vitamin D as directed.  It is very important as a natural anti-inflammatory  helping hair, skin, and nails, as well as reducing stroke and heart attack risk.  It helps your bones and helps with mood. It also decreases numerous cancer risks so please take it as directed.  Low Vit D is associated with a 200-300% higher risk for CANCER  and 200-300% higher risk for HEART   ATTACK  &  STROKE.   ...................................... It is also associated with higher death rate at younger ages,  autoimmune diseases like Rheumatoid arthritis, Lupus, Multiple Sclerosis.    Also many other serious conditions, like depression, Alzheimer's Dementia, infertility, muscle aches, fatigue, fibromyalgia - just to name a few. +++++++++++++++++++++ Recommend the book "The END of DIETING" by Dr Joel Fuhrman  & the book "The END of DIABETES " by Dr Joel Fuhrman At [employer] - get book & Audio CD's    Being diabetic has a  300% increased risk for heart attack, stroke, cancer, and alzheimer- type vascular dementia. It is very important that you work harder with diet by avoiding all foods that are white. Avoid white rice (brown & wild rice is OK), white potatoes (sweetpotatoes in moderation is OK), White bread or wheat bread or anything made out of white flour like bagels, donuts, rolls, buns, biscuits, cakes, pastries, cookies, pizza crust, and pasta (made from white flour & egg whites) - vegetarian pasta or spinach or wheat pasta is OK. Multigrain breads like Arnold's or [employer], or multigrain sandwich thins or flatbreads.  Diet,  exercise and weight loss can reverse and cure diabetes in the early stages.  Diet, exercise and weight loss is very important in the control and prevention of complications of diabetes which affects every system in your body, ie. Brain - dementia/stroke, eyes - glaucoma/blindness, heart - heart attack/heart failure, kidneys - dialysis, stomach - gastric paralysis, intestines - malabsorption, nerves - severe painful neuritis, circulation -   gangrene & loss of a leg(s), and finally cancer and Alzheimers.    I recommend avoid fried & greasy foods,  sweets/candy, white rice (brown or wild rice or Quinoa is OK), white potatoes (sweet potatoes are OK) - anything made from white flour - bagels, doughnuts, rolls, buns, biscuits,white and wheat breads, pizza crust and traditional pasta made of white flour & egg white(vegetarian pasta or spinach or wheat pasta is OK).  Multi-grain bread is OK - like multi-grain flat bread or sandwich thins. Avoid alcohol in excess. Exercise is also important.    Eat all the vegetables you want - avoid meat, especially red meat and dairy - especially cheese.  Cheese is the most concentrated form of trans-fats which is the worst thing to clog up our arteries. Veggie cheese is OK which can be found in the fresh produce section at [employer] or [employer] or [employer]  ++++++++++++++++++++++ DASH Eating Plan  DASH stands for "Dietary Approaches to Stop Hypertension."   The DASH eating plan is a healthy eating plan that has been shown to reduce high blood pressure (hypertension). Additional health benefits may include reducing the risk of type 2 diabetes mellitus, heart disease, and stroke. The DASH eating plan may also help with weight loss. WHAT DO I NEED TO KNOW ABOUT THE DASH EATING PLAN? For the DASH eating plan, you will follow these general guidelines:  Choose foods with a percent daily value for sodium of less than 5% (as listed on the food label).  Use salt-free  seasonings or herbs instead of table salt or sea salt.  Check with your health care provider or pharmacist before using salt substitutes.  Eat lower-sodium products, often labeled as "lower sodium" or "no salt added."  Eat fresh foods.  Eat more vegetables, fruits, and low-fat dairy products.  Choose whole grains. Look for the word "whole" as the first word in the ingredient list.  Choose fish   Limit sweets, desserts, sugars, and sugary drinks.  Choose heart-healthy fats.  Eat veggie cheese   Eat more home-cooked food and less restaurant, buffet, and fast food.  Limit fried foods.  Cook foods using methods other than frying.  Limit canned vegetables. If you do use them, rinse them well to decrease the sodium.  When eating at a restaurant, ask that your food be prepared with less salt, or no salt if possible.                      WHAT FOODS CAN I EAT? Read Dr Joel Fuhrman's books on The End of Dieting & The End of Diabetes  Grains Whole grain or whole wheat bread. Brown rice. Whole grain or whole wheat pasta. Quinoa, bulgur, and whole grain cereals. Low-sodium cereals. Corn or whole wheat flour tortillas. Whole grain cornbread. Whole grain crackers. Low-sodium crackers.  Vegetables Fresh or frozen vegetables (raw, steamed, roasted, or grilled). Low-sodium or reduced-sodium tomato and vegetable juices. Low-sodium or reduced-sodium tomato sauce and paste. Low-sodium or reduced-sodium canned vegetables.   Fruits All fresh, canned (in natural juice), or frozen fruits.  Protein Products  All fish and seafood.  Dried beans, peas, or lentils. Unsalted nuts and seeds. Unsalted canned beans.  Dairy Low-fat dairy products, such as skim or 1% milk, 2% or reduced-fat cheeses, low-fat ricotta or cottage cheese, or plain low-fat yogurt. Low-sodium or reduced-sodium cheeses.  Fats and Oils Tub margarines without trans fats. Light or reduced-fat mayonnaise and salad dressings  (reduced sodium). Avocado. Safflower,   olive, or canola oils. Natural peanut or almond butter.  Other Unsalted popcorn and pretzels. The items listed above may not be a complete list of recommended foods or beverages. Contact your dietitian for more options.  +++++++++++++++++++  WHAT FOODS ARE NOT RECOMMENDED? Grains/ White flour or wheat flour White bread. White pasta. White rice. Refined cornbread. Bagels and croissants. Crackers that contain trans fat.  Vegetables  Creamed or fried vegetables. Vegetables in a . Regular canned vegetables. Regular canned tomato sauce and paste. Regular tomato and vegetable juices.  Fruits Dried fruits. Canned fruit in light or heavy syrup. Fruit juice.  Meat and Other Protein Products Meat in general - RED meat & White meat.  Fatty cuts of meat. Ribs, chicken wings, all processed meats as bacon, sausage, bologna, salami, fatback, hot dogs, bratwurst and packaged luncheon meats.  Dairy Whole or 2% milk, cream, half-and-half, and cream cheese. Whole-fat or sweetened yogurt. Full-fat cheeses or blue cheese. Non-dairy creamers and whipped toppings. Processed cheese, cheese spreads, or cheese curds.  Condiments Onion and garlic salt, seasoned salt, table salt, and sea salt. Canned and packaged gravies. Worcestershire sauce. Tartar sauce. Barbecue sauce. Teriyaki sauce. Soy sauce, including reduced sodium. Steak sauce. Fish sauce. Oyster sauce. Cocktail sauce. Horseradish. Ketchup and mustard. Meat flavorings and tenderizers. Bouillon cubes. Hot sauce. Tabasco sauce. Marinades. Taco seasonings. Relishes.  Fats and Oils Butter, stick margarine, lard, shortening and bacon fat. Coconut, palm kernel, or palm oils. Regular salad dressings.  Pickles and olives. Salted popcorn and pretzels.  The items listed above may not be a complete list of foods and beverages to avoid.    

## 2017-06-18 NOTE — Progress Notes (Signed)
[facility] Unk Pinto, M.D.     Uvaldo Bristle. Silverio Lay, P.A.-C Liane Comber, [place] [address] [place] [address] Telephone [phone] Telefax [phone] Annual Screening/Preventative Visit & Comprehensive Evaluation &  Examination     This very nice 62 y.o. MWF presents for a Screening/Preventative Visit & comprehensive evaluation and management of multiple medical co-morbidities.  Patient has been followed for HTN, Prediabetes, Hyperlipidemia and Vitamin D Deficiency.      HTN predates since 2005. Patient's BP has been controlled at home and patient denies any cardiac symptoms as chest pain, palpitations, shortness of breath, dizziness or ankle swelling. Today's BP is at goal - 138/84      Patient's hyperlipidemia is controlled with diet and medications. Patient denies myalgias or other medication SE's. Last lipids were not at goal: Lab Results  Component Value Date   CHOL 212 (H) 03/04/2017   HDL 48 (L) 03/04/2017   LDLCALC 126 (H) 11/24/2016   TRIG 170 (H) 03/04/2017   CHOLHDL 4.4 03/04/2017      Patient has Morbid Obesity (BMI 43+) and prediabetes (A1c 5.9%/2011, 6.0%/2012 and 5.8% in 2016)  and patient denies reactive hypoglycemic symptoms, visual blurring, diabetic polys, or paresthesias. Last A1c was at goal: Lab Results  Component Value Date   HGBA1C 5.6 03/04/2017      Finally, patient has history of Vitamin D Deficiency ("20"/2008 and "34"/2013) and last Vitamin D was still very low: Lab Results  Component Value Date   VD25OH 37 11/24/2016   Current Outpatient Medications on File Prior to Visit  Medication Sig  . atenolol  100 MG tablet TAKE 1 TABLET (100 MG TOTAL) BY MOUTH DAILY.  Marland Kitchen VITAMIN D Take 5,000 Units by mouth daily.   Marland Kitchen escitalopram 20 MG tablet TAKE 1 TABLET BY MOUTH DAILY.  Marland Kitchen VYVANSE 30 MG capsule Take 1 capsule (30 mg total) daily by mouth.  . CRESTOR 20 MG  tablet TAKE 1 TABLET BY MOUTH DAILY - takes qod  . ZANTAC 300 MG tablet Take 1 tablet w/supper for acid reflux   Allergies  Allergen Reactions  . Lipitor [Atorvastatin]   . Wellbutrin [Bupropion]     dysphoria   Past Medical History:  Diagnosis Date  . B12 deficiency   . Hyperlipidemia   . Hypertension   . Obesity   . Prediabetes   . Vitamin D deficiency    Health Maintenance  Topic Date Due  . PAP SMEAR  01/24/1977  . MAMMOGRAM  11/30/2014  . TETANUS/TDAP  04/17/2017  . COLONOSCOPY  01/15/2018  . INFLUENZA VACCINE  Completed  . Hepatitis C Screening  Completed  . HIV Screening  Completed   Immunization History  Administered Date(s) Administered  . Influenza-Unspecified 04/28/2015, 02/26/2016  . PPD Test 01/31/2014, 02/12/2015, 03/10/2016  . Pneumococcal-Unspecified 06/17/2008  . Td 04/18/2007   Last Colon - 01/16/2008  & due 12/2017 Last Pap & MGM at GYN - 2017 and overdue - plans to schedule  Past Surgical History:  Procedure Laterality Date  . APPENDECTOMY    . BREAST BIOPSY Right 1975  . INGUINAL HERNIA REPAIR Bilateral age 32   Family History  Problem Relation Age of Onset  . Hypertension Mother   . Alzheimer's disease Mother   . Hypertension Father   . Alzheimer's disease Father    Social History   Tobacco Use  . Smoking status: Never Smoker  . Smokeless tobacco: Never Used  Substance Use  Topics  . Alcohol use: Yes    Alcohol/week: 1.2 oz    Types: 2 Standard drinks or equivalent per week    Comment: wine  . Drug use: No    ROS Constitutional: Denies fever, chills, weight loss/gain, headaches, insomnia,  night sweats, and change in appetite. Does c/o fatigue. Eyes: Denies redness, blurred vision, diplopia, discharge, itchy, watery eyes.  ENT: Denies discharge, congestion, post nasal drip, epistaxis, sore throat, earache, hearing loss, dental pain, Tinnitus, Vertigo, Sinus pain, snoring.  Cardio: Denies chest pain, palpitations, irregular  heartbeat, syncope, dyspnea, diaphoresis, orthopnea, PND, claudication, edema Respiratory: denies cough, dyspnea, DOE, pleurisy, hoarseness, laryngitis, wheezing.  Gastrointestinal: Denies dysphagia, heartburn, reflux, water brash, pain, cramps, nausea, vomiting, bloating, diarrhea, constipation, hematemesis, melena, hematochezia, jaundice, hemorrhoids Genitourinary: Denies dysuria, frequency, urgency, nocturia, hesitancy, discharge, hematuria, flank pain Breast: Breast lumps, nipple discharge, bleeding.  Musculoskeletal: Denies arthralgia, myalgia, stiffness, Jt. Swelling, pain, limp, and strain/sprain. Denies falls. Skin: Denies puritis, rash, hives, warts, acne, eczema, changing in skin lesion Neuro: No weakness, tremor, incoordination, spasms, paresthesia, pain Psychiatric: Denies confusion, memory loss, sensory loss. Denies Depression. Endocrine: Denies change in weight, skin, hair change, nocturia, and paresthesia, diabetic polys, visual blurring, hyper / hypo glycemic episodes.  Heme/Lymph: No excessive bleeding, bruising, enlarged lymph nodes.  Physical Exam  BP 138/84   Pulse 72   Temp 97.6 F (36.4 C)   Resp 18   Ht 5' 2.5" (1.588 m)   Wt 243 lb 3.2 oz (110.3 kg)   BMI 43.77 kg/m   General Appearance: Over nourished, well groomed and in no apparent distress.  Eyes: PERRLA, EOMs, conjunctiva no swelling or erythema, normal fundi and vessels. Sinuses: No frontal/maxillary tenderness ENT/Mouth: EACs patent / TMs  nl. Nares clear without erythema, swelling, mucoid exudates. Oral hygiene is good. No erythema, swelling, or exudate. Tongue normal, non-obstructing. Tonsils not swollen or erythematous. Hearing normal.  Neck: Supple, thyroid normal. No bruits, nodes or JVD. Respiratory: Respiratory effort normal.  BS equal and clear bilateral without rales, rhonci, wheezing or stridor. Cardio: Heart sounds are normal with regular rate and rhythm and no murmurs, rubs or gallops.  Peripheral pulses are normal and equal bilaterally without edema. No aortic or femoral bruits. Chest: symmetric with normal excursions and percussion. Breasts: Symmetric, without lumps, nipple discharge, retractions, or fibrocystic changes.  Abdomen: Flat, soft with bowel sounds active. Nontender, no guarding, rebound, hernias, masses, or organomegaly.  Lymphatics: Non tender without lymphadenopathy.  Musculoskeletal: Full ROM all peripheral extremities, joint stability, 5/5 strength, and normal gait. Skin: Warm and dry without rashes, lesions, cyanosis, clubbing or  ecchymosis.  Neuro: Cranial nerves intact, reflexes equal bilaterally. Normal muscle tone, no cerebellar symptoms. Sensation intact.  Pysch: Alert and oriented X 3, normal affect, Insight and Judgment appropriate.   Assessment and Plan  1. Annual Preventative Screening Examination  2. Essential hypertension  - EKG 12-Lead - [place], RETROPERITNL ABD,  LTD - Urinalysis, Routine w reflex microscopic - Microalbumin / creatinine urine ratio - CBC with Differential/Platelet - BASIC METABOLIC PANEL WITH GFR - Magnesium - TSH  3. Hyperlipidemia, mixed  - EKG 12-Lead - [place], RETROPERITNL ABD,  LTD - Hepatic function panel - Lipid panel - TSH  4. Prediabetes  - EKG 12-Lead - [place], RETROPERITNL ABD,  LTD - Hemoglobin A1c - Insulin, random  5. Vitamin D deficiency  - VITAMIN D 25 Hydroxy   6. Screening for colorectal cancer  - POC Hemoccult Bld/Stl  7. Screening for ischemic heart disease  -  EKG 12-Lead  8. Screening for AAA (aortic abdominal aneurysm)  - [place], RETROPERITNL ABD,  LTD  9. Screening examination for pulmonary tuberculosis  - PPD  10. Fatigue, unspecified type  - Iron,Total/Total Iron Binding Cap - Vitamin B12 - CBC with Differential/Platelet  11. Medication management  - Urinalysis, Routine w reflex microscopic - Microalbumin / creatinine urine ratio - CBC with Differential/Platelet - BASIC  METABOLIC PANEL WITH GFR - Hepatic function panel - Magnesium - Lipid panel - TSH - Hemoglobin A1c - Insulin, random - VITAMIN D 25 Hydroxy  12. Morbid obesity with BMI of 40.0-44.9, adult (HCC)  - lisdexamfetamine (VYVANSE) 30 MG capsule; Take 1 capsule (30 mg total) by mouth daily.  Dispense: 30 capsule; Refill: 0  13. Other fatigue  14. Need for prophylactic vaccination with combined diphtheria-tetanus-pertussis (DTP) vaccine       Patient was counseled in prudent diet to achieve/maintain BMI less than 25 for weight control, BP monitoring, regular exercise and medications. Discussed med's effects and SE's. Screening labs and tests as requested with regular follow-up as recommended. Over 40 minutes of exam, counseling, chart review and high complex critical decision making was performed.

## 2017-06-19 LAB — HEPATIC FUNCTION PANEL
AG RATIO: 1.6 (calc) (ref 1.0–2.5)
ALT: 11 U/L (ref 6–29)
AST: 13 U/L (ref 10–35)
Albumin: 4 g/dL (ref 3.6–5.1)
Alkaline phosphatase (APISO): 69 U/L (ref 33–130)
BILIRUBIN DIRECT: 0.1 mg/dL (ref 0.0–0.2)
BILIRUBIN INDIRECT: 0.2 mg/dL (ref 0.2–1.2)
GLOBULIN: 2.5 g/dL (ref 1.9–3.7)
Total Bilirubin: 0.3 mg/dL (ref 0.2–1.2)
Total Protein: 6.5 g/dL (ref 6.1–8.1)

## 2017-06-19 LAB — CBC WITH DIFFERENTIAL/PLATELET
BASOS PCT: 0.7 %
Basophils Absolute: 67 cells/uL (ref 0–200)
EOS PCT: 2.7 %
Eosinophils Absolute: 259 cells/uL (ref 15–500)
HEMATOCRIT: 36.6 % (ref 35.0–45.0)
Hemoglobin: 11.8 g/dL (ref 11.7–15.5)
LYMPHS ABS: 2496 {cells}/uL (ref 850–3900)
MCH: 26.1 pg — ABNORMAL LOW (ref 27.0–33.0)
MCHC: 32.2 g/dL (ref 32.0–36.0)
MCV: 81 fL (ref 80.0–100.0)
MPV: 11 fL (ref 7.5–12.5)
Monocytes Relative: 5.2 %
NEUTROS PCT: 65.4 %
Neutro Abs: 6278 cells/uL (ref 1500–7800)
PLATELETS: 229 10*3/uL (ref 140–400)
RBC: 4.52 10*6/uL (ref 3.80–5.10)
RDW: 13.6 % (ref 11.0–15.0)
Total Lymphocyte: 26 %
WBC mixed population: 499 cells/uL (ref 200–950)
WBC: 9.6 10*3/uL (ref 3.8–10.8)

## 2017-06-19 LAB — URINALYSIS, ROUTINE W REFLEX MICROSCOPIC
Bilirubin Urine: NEGATIVE
GLUCOSE, UA: NEGATIVE
Hgb urine dipstick: NEGATIVE
LEUKOCYTES UA: NEGATIVE
Nitrite: NEGATIVE
PROTEIN: NEGATIVE
SPECIFIC GRAVITY, URINE: 1.022 (ref 1.001–1.03)
pH: 5.5 (ref 5.0–8.0)

## 2017-06-19 LAB — BASIC METABOLIC PANEL WITH GFR
BUN / CREAT RATIO: 15 (calc) (ref 6–22)
BUN: 18 mg/dL (ref 7–25)
CO2: 29 mmol/L (ref 20–32)
CREATININE: 1.19 mg/dL — AB (ref 0.50–0.99)
Calcium: 9.1 mg/dL (ref 8.6–10.4)
Chloride: 103 mmol/L (ref 98–110)
GFR, EST AFRICAN AMERICAN: 57 mL/min/{1.73_m2} — AB (ref >=60)
GFR, Est Non African American: 49 mL/min/{1.73_m2} — ABNORMAL LOW (ref >=60)
Glucose, Bld: 100 mg/dL — ABNORMAL HIGH (ref 65–99)
Potassium: 4.1 mmol/L (ref 3.5–5.3)
Sodium: 142 mmol/L (ref 135–146)

## 2017-06-19 LAB — LIPID PANEL
CHOL/HDL RATIO: 5 (calc) — AB (ref <5.0)
Cholesterol: 211 mg/dL — ABNORMAL HIGH (ref <200)
HDL: 42 mg/dL — ABNORMAL LOW (ref >50)
LDL CHOLESTEROL (CALC): 130 mg/dL — AB
Non-HDL Cholesterol (Calc): 169 mg/dL (calc) — ABNORMAL HIGH (ref <130)
Triglycerides: 251 mg/dL — ABNORMAL HIGH (ref <150)

## 2017-06-19 LAB — VITAMIN D 25 HYDROXY (VIT D DEFICIENCY, FRACTURES): Vit D, 25-Hydroxy: 34 ng/mL (ref 30–100)

## 2017-06-19 LAB — HEMOGLOBIN A1C
HEMOGLOBIN A1C: 6.2 %{Hb} — AB (ref <5.7)
Mean Plasma Glucose: 131 (calc)
eAG (mmol/L): 7.3 (calc)

## 2017-06-19 LAB — IRON, TOTAL/TOTAL IRON BINDING CAP
%SAT: 14 % (calc) (ref 11–50)
IRON: 59 ug/dL (ref 45–160)
TIBC: 434 ug/dL (ref 250–450)

## 2017-06-19 LAB — VITAMIN B12: Vitamin B-12: 359 pg/mL (ref 200–1100)

## 2017-06-19 LAB — TSH: TSH: 1.76 m[IU]/L (ref 0.40–4.50)

## 2017-06-19 LAB — MICROALBUMIN / CREATININE URINE RATIO
Creatinine, Urine: 146 mg/dL (ref 20–275)
MICROALB/CREAT RATIO: 2 ug/mg{creat} (ref <30)
Microalb, Ur: 0.3 mg/dL

## 2017-06-19 LAB — MAGNESIUM: Magnesium: 2.1 mg/dL (ref 1.5–2.5)

## 2017-06-19 LAB — INSULIN, RANDOM: Insulin: 56.5 u[IU]/mL — ABNORMAL HIGH (ref 2.0–19.6)

## 2017-06-21 ENCOUNTER — Encounter: Payer: Self-pay | Admitting: Internal Medicine

## 2017-08-04 ENCOUNTER — Other Ambulatory Visit: Payer: Self-pay | Admitting: Internal Medicine

## 2017-08-04 DIAGNOSIS — Z6841 Body Mass Index (BMI) 40.0 and over, adult: Principal | ICD-10-CM

## 2017-08-04 MED ORDER — LISDEXAMFETAMINE DIMESYLATE 30 MG PO CAPS: 30.0000 mg | ORAL_CAPSULE | Freq: Every day | ORAL | 0 refills | Status: DC

## 2017-09-09 ENCOUNTER — Other Ambulatory Visit: Payer: Self-pay | Admitting: Internal Medicine

## 2017-09-09 DIAGNOSIS — Z6841 Body Mass Index (BMI) 40.0 and over, adult: Principal | ICD-10-CM

## 2017-09-09 MED ORDER — LISDEXAMFETAMINE DIMESYLATE 30 MG PO CAPS: 30.0000 mg | ORAL_CAPSULE | Freq: Every day | ORAL | 0 refills | Status: DC

## 2017-09-22 NOTE — Progress Notes (Signed)
Assessment and Plan:   Essential hypertension - continue medications, DASH diet, exercise and monitor at home. Call if greater than 130/80.  -     CBC with Differential/Platelet -     BASIC METABOLIC PANEL WITH GFR -     Hepatic function panel -     TSH  Hyperlipidemia, mixed -continue medications, check lipids, decrease fatty foods, increase activity.  -     Lipid panel  Prediabetes Discussed general issues about diabetes pathophysiology and management., Educational material distributed., Suggested low cholesterol diet., Encouraged aerobic exercise., Discussed foot care., Reminded to get yearly retinal exam. -     Hemoglobin A1c  Morbid obesity with BMI of 40.0-44.9, adult (HCC) -     lisdexamfetamine (VYVANSE) 30 MG capsule; Take 1 capsule (30 mg total) by mouth daily.  Medication management -     Magnesium  Other fatigue -     lisdexamfetamine (VYVANSE) 30 MG capsule; Take 1 capsule (30 mg total) by mouth daily.  Continue diet and meds as discussed. Further disposition pending results of labs. Future Appointments  Date Time Provider [place]  12/29/2017  2:30 PM Unk Pinto, MD [facility] None  07/07/2018  2:00 PM Unk Pinto, MD [facility] None    HPI 62 y.o. female  presents for 3 month follow up with hypertension, hyperlipidemia, prediabetes and vitamin D. Her blood pressure has been controlled at home, she is just on atenolol, today their BP is BP: 120/76 She does not workout. She denies chest pain, shortness of breath, dizziness.  She is on cholesterol medication, crestor 20mg  QOD and denies myalgias. Her cholesterol is at goal. The cholesterol last visit was:   Lab Results  Component Value Date   CHOL 211 (H) 06/18/2017   HDL 42 (L) 06/18/2017   LDLCALC 130 (H) 06/18/2017   TRIG 251 (H) 06/18/2017   CHOLHDL 5.0 (H) 06/18/2017   She has been working on diet and exercise for prediabetes, and denies paresthesia of the feet, polydipsia, polyuria and  visual disturbances. Last A1C in the office was:  Lab Results  Component Value Date   HGBA1C 6.2 (H) 06/18/2017   Patient is on Vitamin D supplement.   Lab Results  Component Value Date   VD25OH 34 06/18/2017      She has sjogren's use to follow with Dr. Amil Amen but has not been a long time, denies problems.  She is on lexapro 20mg  which helps. She is on vyvanse that is better than adderall for dry mouth, only takes week days.  BMI is Body mass index is 43.49 kg/m., she is working on diet and exercise. She eats 3 meals a day, she states she does not drink enough water, but no soda/diet soda. Will eat out during the weekends or week day nights, works at school so takes lunch normally,  [facility] Readings from Last 3 Encounters:  09/24/17 241 lb 9.6 oz (109.6 kg)  06/18/17 243 lb 3.2 oz (110.3 kg)  06/03/17 238 lb (108 kg)    Current Medications:  Current Outpatient Medications on File Prior to Visit  Medication Sig Dispense Refill  . atenolol (TENORMIN) 100 MG tablet TAKE 1 TABLET (100 MG TOTAL) BY MOUTH DAILY. 90 tablet 1  . Cholecalciferol (VITAMIN D PO) Take 5,000 Units by mouth daily.     Marland Kitchen escitalopram (LEXAPRO) 20 MG tablet TAKE 1 TABLET BY MOUTH DAILY. 90 tablet 4  . lisdexamfetamine (VYVANSE) 30 MG capsule Take 1 capsule (30 mg total) by mouth daily. [place]  capsule 0  . rosuvastatin (CRESTOR) 20 MG tablet TAKE 1 TABLET BY MOUTH DAILY FOR CHOLESTEROL. 30 tablet PRN  . ranitidine (ZANTAC) 300 MG tablet Take 1 tablet w/supper for acid reflux 90 tablet 1   No current facility-administered medications on file prior to visit.    Medical History:  Past Medical History:  Diagnosis Date  . B12 deficiency   . Hyperlipidemia   . Hypertension   . Obesity   . Prediabetes   . Vitamin D deficiency    Allergies:  Allergies  Allergen Reactions  . Lipitor [Atorvastatin]   . Wellbutrin [Bupropion]     dysphoria    Review of Systems:  Review of Systems  Constitutional: Negative for chills,  diaphoresis, fever, malaise/fatigue and weight loss.  HENT: Positive for congestion and ear pain. Negative for ear discharge, hearing loss, nosebleeds, sinus pain, sore throat and tinnitus.   Eyes: Negative.   Respiratory: Negative.  Negative for stridor.   Cardiovascular: Negative.   Gastrointestinal: Negative for abdominal pain, blood in stool, constipation, diarrhea, heartburn, melena, nausea and vomiting.  Genitourinary: Negative.   Musculoskeletal: Negative.        Right foot heel pain  Skin: Negative.   Neurological: Negative.  Negative for weakness and headaches.  Psychiatric/Behavioral: Negative for depression, hallucinations, memory loss, substance abuse and suicidal ideas. The patient is not nervous/anxious and does not have insomnia.     Family history- Review and unchanged Social history- Review and unchanged Physical Exam: BP 120/76   Pulse 81   Temp 97.8 F (36.6 C)   Ht 5' 2.5" (1.588 m)   Wt 241 lb 9.6 oz (109.6 kg)   SpO2 94%   BMI 43.49 kg/m  Wt Readings from Last 3 Encounters:  09/24/17 241 lb 9.6 oz (109.6 kg)  06/18/17 243 lb 3.2 oz (110.3 kg)  06/03/17 238 lb (108 kg)   General Appearance: Well nourished, in no apparent distress. Eyes: PERRLA, EOMs, conjunctiva no swelling or erythema Sinuses: No Frontal/maxillary tenderness ENT/Mouth: Ext aud canals clear, TMs without erythema, bulging. No erythema, swelling, or exudate on post pharynx.  Tonsils not swollen or erythematous. Hearing normal.  Neck: Supple, thyroid normal.  Respiratory: Respiratory effort normal, BS equal bilaterally without rales, rhonchi, wheezing or stridor.  Cardio: RRR with no MRGs. Brisk peripheral pulses without edema.  Abdomen: Soft, + BS, obese  Non tender, no guarding, rebound, hernias, masses. Lymphatics: Non tender without lymphadenopathy.  Musculoskeletal: Full ROM, 5/5 strength, normal gait.  Skin: Warm, dry without rashes, lesions, ecchymosis.  Neuro: Cranial nerves intact.  Normal muscle tone, no cerebellar symptoms. Sensation intact.  Psych: Awake and oriented X 3, normal affect, Insight and Judgment appropriate.    Vicie Mutters, PA-C 2:44 PM [facility]

## 2017-09-24 ENCOUNTER — Encounter: Payer: Self-pay | Admitting: Physician Assistant

## 2017-09-24 ENCOUNTER — Ambulatory Visit: Payer: BC Managed Care – PPO | Admitting: Physician Assistant

## 2017-09-24 VITALS — BP 120/76 | HR 81 | Temp 97.8°F | Ht 62.5 in | Wt 241.6 lb

## 2017-09-24 DIAGNOSIS — R7303 Prediabetes: Secondary | ICD-10-CM | POA: Diagnosis not present

## 2017-09-24 DIAGNOSIS — Z79899 Other long term (current) drug therapy: Secondary | ICD-10-CM

## 2017-09-24 DIAGNOSIS — I10 Essential (primary) hypertension: Secondary | ICD-10-CM

## 2017-09-24 DIAGNOSIS — M3500 Sicca syndrome, unspecified: Secondary | ICD-10-CM | POA: Diagnosis not present

## 2017-09-24 DIAGNOSIS — E782 Mixed hyperlipidemia: Secondary | ICD-10-CM

## 2017-09-24 MED ORDER — ATENOLOL 100 MG PO TABS
100.0000 mg | ORAL_TABLET | Freq: Every day | ORAL | 1 refills | Status: DC
Start: 2017-09-24 — End: 2019-02-14

## 2017-09-24 NOTE — Patient Instructions (Addendum)
Being dehydrated can hurt your kidneys, cause fatigue, headaches, muscle aches, joint pain, and dry skin/nails so please increase your fluids.   Drink 80-100 oz a day of water, measure it out! Eat 3 meals a day, have to do breakfast, eat protein- hard boiled eggs, protein bar like [place] protein bar, greek yogurt like oikos triple zero, chobani 100, or light n fit greek  Check out  Mini habits for weight loss book  2 apps for tracking food is myfitness pal  loseit OR can take picture of your food  Are you an emotional eater? Do you eat more when you're feeling stressed? Do you eat when you're not hungry or when you're full? Do you eat to feel better (to calm and soothe yourself when you're sad, mad, bored, anxious, etc.)? Do you reward yourself with food? Do you regularly eat until you've stuffed yourself? Does food make you feel safe? Do you feel like food is a friend? Do you feel powerless or out of control around food?  If you answered yes to some of these questions than it is likely that you are an emotional eater. This is normally a learned behavior and can take time to first recognize the signs and second BREAK THE HABIT. But here is more information and tips to help.   The difference between emotional hunger and physical hunger Emotional hunger can be powerful, so it's easy to mistake it for physical hunger. But there are clues you can look for to help you tell physical and emotional hunger apart.  Emotional hunger comes on suddenly. It hits you in an instant and feels overwhelming and urgent. Physical hunger, on the other hand, comes on more gradually. The urge to eat doesn't feel as dire or demand instant satisfaction (unless you haven't eaten for a very long time).  Emotional hunger craves specific comfort foods. When you're physically hungry, almost anything sounds good-including healthy stuff like vegetables. But emotional hunger craves junk food or sugary snacks that  provide an instant rush. You feel like you need cheesecake or pizza, and nothing else will do.  Emotional hunger often leads to mindless eating. Before you know it, you've eaten a whole bag of chips or an entire pint of ice cream without really paying attention or fully enjoying it. When you're eating in response to physical hunger, you're typically more aware of what you're doing.  Emotional hunger isn't satisfied once you're full. You keep wanting more and more, often eating until you're uncomfortably stuffed. Physical hunger, on the other hand, doesn't need to be stuffed. You feel satisfied when your stomach is full.  Emotional hunger isn't located in the stomach. Rather than a growling belly or a pang in your stomach, you feel your hunger as a craving you can't get out of your head. You're focused on specific textures, tastes, and smells.  Emotional hunger often leads to regret, guilt, or shame. When you eat to satisfy physical hunger, you're unlikely to feel guilty or ashamed because you're simply giving your body what it needs. If you feel guilty after you eat, it's likely because you know deep down that you're not eating for nutritional reasons.  Identify your emotional eating triggers What situations, places, or feelings make you reach for the comfort of food? Most emotional eating is linked to unpleasant feelings, but it can also be triggered by positive emotions, such as rewarding yourself for achieving a goal or celebrating a holiday or happy event. Common causes of emotional  eating include:  Stuffing emotions - Eating can be a way to temporarily silence or "stuff down" uncomfortable emotions, including anger, fear, sadness, anxiety, loneliness, resentment, and shame. While you're numbing yourself with food, you can avoid the difficult emotions you'd rather not feel.  Boredom or feelings of emptiness - Do you ever eat simply to give yourself something to do, to relieve boredom, or as a way  to fill a void in your life? You feel unfulfilled and empty, and food is a way to occupy your mouth and your time. In the moment, it fills you up and distracts you from underlying feelings of purposelessness and dissatisfaction with your life.  Childhood habits - Think back to your childhood memories of food. Did your parents reward good behavior with ice cream, take you out for pizza when you got a good report card, or serve you sweets when you were feeling sad? These habits can often carry over into adulthood. Or your eating may be driven by nostalgia-for cherished memories of grilling burgers in the backyard with your dad or baking and eating cookies with your mom.  Social influences - Getting together with other people for a meal is a great way to relieve stress, but it can also lead to overeating. It's easy to overindulge simply because the food is there or because everyone else is eating. You may also overeat in social situations out of nervousness. Or perhaps your family or circle of friends encourages you to overeat, and it's easier to go along with the group.  Stress - Ever notice how stress makes you hungry? It's not just in your mind. When stress is chronic, as it so often is in our chaotic, fast-paced world, your body produces high levels of the stress hormone, cortisol. Cortisol triggers cravings for salty, sweet, and fried foods-foods that give you a burst of energy and pleasure. The more uncontrolled stress in your life, the more likely you are to turn to food for emotional relief.  Find other ways to feed your feelings If you don't know how to manage your emotions in a way that doesn't involve food, you won't be able to control your eating habits for very long. Diets so often fail because they offer logical nutritional advice which only works if you have conscious control over your eating habits. It doesn't work when emotions hijack the process, demanding an immediate payoff with food.  In  order to stop emotional eating, you have to find other ways to fulfill yourself emotionally. It's not enough to understand the cycle of emotional eating or even to understand your triggers, although that's a huge first step. You need alternatives to food that you can turn to for emotional fulfillment.  Alternatives to emotional eating If you're depressed or lonely, call someone who always makes you feel better, play with your dog or cat, or look at a favorite photo or cherished memento.  If you're anxious, expend your nervous energy by dancing to your favorite song, squeezing a stress ball, or taking a brisk walk.  If you're exhausted, treat yourself with a hot cup of tea, take a bath, light some scented candles, or wrap yourself in a warm blanket.  If you're bored, read a good book, watch a comedy show, explore the outdoors, or turn to an activity you enjoy (woodworking, playing the guitar, shooting hoops, scrapbooking, etc.).  What is mindful eating? Mindful eating is a practice that develops your awareness of eating habits and allows you to pause  between your triggers and your actions. Most emotional eaters feel powerless over their food cravings. When the urge to eat hits, you feel an almost unbearable tension that demands to be fed, right now. Because you've tried to resist in the past and failed, you believe that your willpower just isn't up to snuff. But the truth is that you have more power over your cravings than you think.  Take 5 before you give in to a craving Emotional eating tends to be automatic and virtually mindless. Before you even realize what you're doing, you've reached for a tub of ice cream and polished off half of it. But if you can take a moment to pause and reflect when you're hit with a craving, you give yourself the opportunity to make a different decision.  Can you put off eating for five minutes? Or just start with one minute. Don't tell yourself you can't give in to  the craving; remember, the forbidden is extremely tempting. Just tell yourself to wait.  While you're waiting, check in with yourself. How are you feeling? What's going on emotionally? Even if you end up eating, you'll have a better understanding of why you did it. This can help you set yourself up for a different response next time.  How to practice mindful eating Eating while you're also doing other things-such as watching TV, driving, or playing with your phone-can prevent you from fully enjoying your food. Since your mind is elsewhere, you may not feel satisfied or continue eating even though you're no longer hungry. Eating more mindfully can help focus your mind on your food and the pleasure of a meal and curb overeating.   Eat your meals in a calm place with no distractions, aside from any dining companions.  Try eating with your non-dominant hand or using chopsticks instead of a knife and fork. Eating in such a non-familiar way can slow down how fast you eat and ensure your mind stays focused on your food.  Allow yourself enough time not to have to rush your meal. Set a timer for 20 minutes and pace yourself so you spend at least that much time eating.  Take small bites and chew them well, taking time to notice the different flavors and textures of each mouthful.  Put your utensils down between bites. Take time to consider how you feel-hungry, satiated-before picking up your utensils again.  Try to stop eating before you are full.It takes time for the signal to reach your brain that you've had enough. Don't feel obligated to always clean your plate.  When you've finished your food, take a few moments to assess if you're really still hungry before opting for an extra serving or dessert.  Learn to accept your feelings-even the bad ones  While it may seem that the core problem is that you're powerless over food, emotional eating actually stems from feeling powerless over your emotions.  You don't feel capable of dealing with your feelings head on, so you avoid them with food.  Recommended reading  Mini Habits for weight loss  Healthy Eating: A guide to the new nutrition - [place] Report  10 Tips for Mindful Eating - How mindfulness can help you fully enjoy a meal and the experience of eating-with moderation and restraint. ([place])  Weight Loss: Gain Control of Emotional Eating - Tips to regain control of your eating habits. [facility])  Why Stress Causes People to Overeat -Tips on controlling stress  eating. ([place])  Mindful Eating Meditations -Free online mindfulness meditations. ([facility] [employer])    Veggies are great because you can eat a ton! They are low in calories, great to fill you up, and have a ton of vitamins, minerals, and protein.

## 2017-09-28 LAB — HEMOGLOBIN A1C
EAG (MMOL/L): 7 (calc)
Hgb A1c MFr Bld: 6 % of total Hgb — ABNORMAL HIGH (ref <5.7)
Mean Plasma Glucose: 126 (calc)

## 2017-09-28 LAB — COMPLETE METABOLIC PANEL WITH GFR
AG Ratio: 1.6 (calc) (ref 1.0–2.5)
ALKALINE PHOSPHATASE (APISO): 73 U/L (ref 33–130)
ALT: 11 U/L (ref 6–29)
AST: 14 U/L (ref 10–35)
Albumin: 4.2 g/dL (ref 3.6–5.1)
BILIRUBIN TOTAL: 0.3 mg/dL (ref 0.2–1.2)
BUN: 17 mg/dL (ref 7–25)
CO2: 33 mmol/L — ABNORMAL HIGH (ref 20–32)
Calcium: 9.9 mg/dL (ref 8.6–10.4)
Chloride: 102 mmol/L (ref 98–110)
Creat: 0.85 mg/dL (ref 0.50–0.99)
GFR, Est African American: 86 mL/min/{1.73_m2} (ref >=60)
GFR, Est Non African American: 74 mL/min/{1.73_m2} (ref >=60)
Globulin: 2.7 g/dL (calc) (ref 1.9–3.7)
Glucose, Bld: 127 mg/dL — ABNORMAL HIGH (ref 65–99)
Potassium: 4.1 mmol/L (ref 3.5–5.3)
Sodium: 141 mmol/L (ref 135–146)
Total Protein: 6.9 g/dL (ref 6.1–8.1)

## 2017-09-28 LAB — CBC WITH DIFFERENTIAL/PLATELET
BASOS ABS: 63 {cells}/uL (ref 0–200)
BASOS PCT: 0.6 %
EOS ABS: 273 {cells}/uL (ref 15–500)
EOS PCT: 2.6 %
HCT: 36.8 % (ref 35.0–45.0)
Hemoglobin: 11.8 g/dL (ref 11.7–15.5)
Lymphs Abs: 2867 cells/uL (ref 850–3900)
MCH: 25.4 pg — AB (ref 27.0–33.0)
MCHC: 32.1 g/dL (ref 32.0–36.0)
MCV: 79.1 fL — ABNORMAL LOW (ref 80.0–100.0)
MONOS PCT: 5.2 %
MPV: 11.1 fL (ref 7.5–12.5)
Neutro Abs: 6752 cells/uL (ref 1500–7800)
Neutrophils Relative %: 64.3 %
PLATELETS: 239 10*3/uL (ref 140–400)
RBC: 4.65 10*6/uL (ref 3.80–5.10)
RDW: 13.7 % (ref 11.0–15.0)
TOTAL LYMPHOCYTE: 27.3 %
WBC mixed population: 546 cells/uL (ref 200–950)
WBC: 10.5 10*3/uL (ref 3.8–10.8)

## 2017-09-28 LAB — LIPID PANEL
CHOLESTEROL: 201 mg/dL — AB (ref <200)
HDL: 45 mg/dL — ABNORMAL LOW (ref >50)
LDL Cholesterol (Calc): 119 mg/dL (calc) — ABNORMAL HIGH
Non-HDL Cholesterol (Calc): 156 mg/dL (calc) — ABNORMAL HIGH (ref <130)
TRIGLYCERIDES: 241 mg/dL — AB (ref <150)
Total CHOL/HDL Ratio: 4.5 (calc) (ref <5.0)

## 2017-09-28 LAB — TSH: TSH: 2.17 m[IU]/L (ref 0.40–4.50)

## 2017-10-21 ENCOUNTER — Other Ambulatory Visit: Payer: Self-pay | Admitting: Internal Medicine

## 2017-10-21 DIAGNOSIS — Z6841 Body Mass Index (BMI) 40.0 and over, adult: Principal | ICD-10-CM

## 2017-10-21 MED ORDER — LISDEXAMFETAMINE DIMESYLATE 30 MG PO CAPS: 30.0000 mg | ORAL_CAPSULE | Freq: Every day | ORAL | 0 refills | Status: DC

## 2017-12-09 ENCOUNTER — Other Ambulatory Visit: Payer: Self-pay | Admitting: Internal Medicine

## 2017-12-29 ENCOUNTER — Ambulatory Visit: Payer: Self-pay | Admitting: Internal Medicine

## 2018-01-12 ENCOUNTER — Encounter: Payer: Self-pay | Admitting: Internal Medicine

## 2018-01-12 ENCOUNTER — Ambulatory Visit (INDEPENDENT_AMBULATORY_CARE_PROVIDER_SITE_OTHER): Payer: BC Managed Care – PPO | Admitting: Internal Medicine

## 2018-01-12 VITALS — BP 138/84 | HR 76 | Temp 97.6°F | Resp 18 | Ht 62.5 in | Wt 237.4 lb

## 2018-01-12 DIAGNOSIS — R7303 Prediabetes: Secondary | ICD-10-CM | POA: Diagnosis not present

## 2018-01-12 DIAGNOSIS — Z79899 Other long term (current) drug therapy: Secondary | ICD-10-CM

## 2018-01-12 DIAGNOSIS — E782 Mixed hyperlipidemia: Secondary | ICD-10-CM | POA: Diagnosis not present

## 2018-01-12 DIAGNOSIS — I10 Essential (primary) hypertension: Secondary | ICD-10-CM | POA: Diagnosis not present

## 2018-01-12 DIAGNOSIS — Z6841 Body Mass Index (BMI) 40.0 and over, adult: Secondary | ICD-10-CM

## 2018-01-12 DIAGNOSIS — E559 Vitamin D deficiency, unspecified: Secondary | ICD-10-CM

## 2018-01-12 NOTE — Patient Instructions (Signed)

## 2018-01-12 NOTE — Progress Notes (Signed)
This very nice 62 y.o. MWF presents for 6 month follow up with HTN, HLD, Pre-Diabetes and Vitamin D Deficiency. 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     Patient is treated for HTN (2005) & BP has been controlled at home. Today's BP is elevated at 142/90 and rechecked at 138/84. Patient has had no complaints of any cardiac type chest pain, palpitations, dyspnea / orthopnea / PND, dizziness, claudication, or dependent edema.     Hyperlipidemia is controlled with diet & meds. Patient denies myalgias or other med SE's. Last Lipids were not at goal: Lab Results  Component Value Date   CHOL 193 01/12/2018   HDL 42 (L) 01/12/2018   LDLCALC 111 (H) 01/12/2018   TRIG 280 (H) 01/12/2018   CHOLHDL 4.6 01/12/2018      Also, the patient has history of Morbid obesity (BMI 42+) and PreDiabetes (A1c 5.9%/2011, 6.0%/2012 and 5.8% in 2016)  and has had no symptoms of reactive hypoglycemia, diabetic polys, paresthesias or visual blurring.  Last A1c was not at goal: Lab Results  Component Value Date   HGBA1C 5.9 (H) 01/12/2018      Further, the patient also has history of Vitamin D Deficiency ("20"/2008 & "34"/2013)  and supplements vitamin D without any suspected side-effects. Last vitamin D was still not at goal (70-100):  Lab Results  Component Value Date   VD25OH 46 01/12/2018   Current Outpatient Medications on File Prior to Visit  Medication Sig  . atenolol (TENORMIN) 100 MG tablet Take 1 tablet (100 mg total) by mouth daily.  . Cholecalciferol (VITAMIN D PO) Take 5,000 Units by mouth daily.   Marland Kitchen escitalopram (LEXAPRO) 20 MG tablet TAKE 1 TABLET BY MOUTH DAILY.  Marland Kitchen OVER THE COUNTER MEDICATION Patient takes OTC Ranitidine 150 mg PRN.  . rosuvastatin (CRESTOR) 20 MG tablet TAKE 1 TABLET BY MOUTH DAILY FOR CHOLESTEROL.   No current facility-administered medications on file prior to visit.    Allergies  Allergen Reactions  . Lipitor [Atorvastatin]   .  Wellbutrin [Bupropion]     dysphoria   PMHx:   Past Medical History:  Diagnosis Date  . B12 deficiency   . Hyperlipidemia   . Hypertension   . Obesity   . Prediabetes   . Vitamin D deficiency    Immunization History  Administered Date(s) Administered  . Influenza-Unspecified 04/28/2015, 02/26/2016  . PPD Test 01/31/2014, 02/12/2015, 03/10/2016, 06/18/2017  . Pneumococcal-Unspecified 06/17/2008  . Td 04/18/2007  . Tdap 06/18/2017   Past Surgical History:  Procedure Laterality Date  . APPENDECTOMY    . BREAST BIOPSY Right 1975  . INGUINAL HERNIA REPAIR Bilateral age 35   FHx:    Reviewed / unchanged  SHx:    Reviewed / unchanged   Systems Review:  Constitutional: Denies fever, chills, wt changes, headaches, insomnia, fatigue, night sweats, change in appetite. Eyes: Denies redness, blurred vision, diplopia, discharge, itchy, watery eyes.  ENT: Denies discharge, congestion, post nasal drip, epistaxis, sore throat, earache, hearing loss, dental pain, tinnitus, vertigo, sinus pain, snoring.  CV: Denies chest pain, palpitations, irregular heartbeat, syncope, dyspnea, diaphoresis, orthopnea, PND, claudication or edema. Respiratory: denies cough, dyspnea, DOE, pleurisy, hoarseness, laryngitis, wheezing.  Gastrointestinal: Denies dysphagia, odynophagia, heartburn, reflux, water brash, abdominal pain or cramps, nausea, vomiting, bloating, diarrhea, constipation, hematemesis, melena, hematochezia  or hemorrhoids. Genitourinary: Denies dysuria, frequency, urgency, nocturia, hesitancy, discharge, hematuria or flank pain. Musculoskeletal: Denies arthralgias, myalgias, stiffness, jt.  swelling, pain, limping or strain/sprain.  Skin: Denies pruritus, rash, hives, warts, acne, eczema or change in skin lesion(s). Neuro: No weakness, tremor, incoordination, spasms, paresthesia or pain. Psychiatric: Denies confusion, memory loss or sensory loss. Endo: Denies change in weight, skin or hair  change.  Heme/Lymph: No excessive bleeding, bruising or enlarged lymph nodes.  Physical Exam  BP 138/84   Pulse 76   Temp 97.6 F (36.4 C)   Resp 18   Ht 5' 2.5" (1.588 m)   Wt 237 lb 6.4 oz (107.7 kg)   BMI 42.73 kg/m   Appears  over nourished, well groomed  and in no distress.  Eyes: PERRLA, EOMs, conjunctiva no swelling or erythema. Sinuses: No frontal/maxillary tenderness ENT/Mouth: EAC's clear, TM's nl w/o erythema, bulging. Nares clear w/o erythema, swelling, exudates. Oropharynx clear without erythema or exudates. Oral hygiene is good. Tongue normal, non obstructing. Hearing intact.  Neck: Supple. Thyroid not palpable. Car 2+/2+ without bruits, nodes or JVD. Chest: Respirations nl with BS clear & equal w/o rales, rhonchi, wheezing or stridor.  Cor: Heart sounds normal w/ regular rate and rhythm without sig. murmurs, gallops, clicks or rubs. Peripheral pulses normal and equal  without edema.  Abdomen: Soft & bowel sounds normal. Non-tender w/o guarding, rebound, hernias, masses or organomegaly.  Lymphatics: Unremarkable.  Musculoskeletal: Full ROM all peripheral extremities, joint stability, 5/5 strength and normal gait.  Skin: Warm, dry without exposed rashes, lesions or ecchymosis apparent.  Neuro: Cranial nerves intact, reflexes equal bilaterally. Sensory-motor testing grossly intact. Tendon reflexes grossly intact.  Pysch: Alert & oriented x 3.  Insight and judgement nl & appropriate. No ideations.  Assessment and Plan:  - Continue medication, monitor blood pressure at home.  - Continue DASH diet.  Reminder to go to the ER if any CP,  SOB, nausea, dizziness, severe HA, changes vision/speech.  - Continue diet/meds, exercise,& lifestyle modifications.  - Continue monitor periodic cholesterol/liver & renal functions   1. Essential hypertension  - CBC with Differential/Platelet - COMPLETE METABOLIC PANEL WITH GFR - Magnesium - TSH  2. Hyperlipidemia, mixed  -  Lipid panel - TSH  3. Prediabetes  - Hemoglobin A1c - Insulin, random  4. Vitamin D deficiency  - VITAMIN D 25 Hydroxy (Vit-D Deficiency, Fractures)  5. Medication management  - CBC with Differential/Platelet - COMPLETE METABOLIC PANEL WITH GFR - Magnesium - Lipid panel - TSH - Hemoglobin A1c - Insulin, random - VITAMIN D 25 Hydroxy (Vit-D Deficiency, Fractures)  6. Morbid obesity with BMI of 40.0-44.9, adult (HCC)   - Continue diet, exercise, lifestyle modifications.  - Monitor appropriate labs. - Continue supplementation.      Discussed  regular exercise, BP monitoring, weight control to achieve/maintain BMI less than 25 and discussed med and SE's. Recommended labs to assess and monitor clinical status with further disposition pending results of labs. Over 30 minutes of exam, counseling, chart review was performed.

## 2018-01-13 ENCOUNTER — Other Ambulatory Visit: Payer: Self-pay | Admitting: Internal Medicine

## 2018-01-13 DIAGNOSIS — Z1212 Encounter for screening for malignant neoplasm of rectum: Secondary | ICD-10-CM

## 2018-01-13 DIAGNOSIS — Z1211 Encounter for screening for malignant neoplasm of colon: Secondary | ICD-10-CM

## 2018-01-13 DIAGNOSIS — D500 Iron deficiency anemia secondary to blood loss (chronic): Secondary | ICD-10-CM

## 2018-01-13 LAB — COMPLETE METABOLIC PANEL WITH GFR
AG RATIO: 1.7 (calc) (ref 1.0–2.5)
ALT: 13 U/L (ref 6–29)
AST: 14 U/L (ref 10–35)
Albumin: 4.5 g/dL (ref 3.6–5.1)
Alkaline phosphatase (APISO): 76 U/L (ref 33–130)
BUN: 20 mg/dL (ref 7–25)
CALCIUM: 9.9 mg/dL (ref 8.6–10.4)
CO2: 29 mmol/L (ref 20–32)
Chloride: 104 mmol/L (ref 98–110)
Creat: 0.84 mg/dL (ref 0.50–0.99)
GFR, EST AFRICAN AMERICAN: 87 mL/min/{1.73_m2} (ref >=60)
GFR, EST NON AFRICAN AMERICAN: 75 mL/min/{1.73_m2} (ref >=60)
GLUCOSE: 97 mg/dL (ref 65–99)
Globulin: 2.6 g/dL (calc) (ref 1.9–3.7)
POTASSIUM: 5.2 mmol/L (ref 3.5–5.3)
Sodium: 142 mmol/L (ref 135–146)
TOTAL PROTEIN: 7.1 g/dL (ref 6.1–8.1)
Total Bilirubin: 0.3 mg/dL (ref 0.2–1.2)

## 2018-01-13 LAB — LIPID PANEL
CHOL/HDL RATIO: 4.6 (calc) (ref <5.0)
CHOLESTEROL: 193 mg/dL (ref <200)
HDL: 42 mg/dL — AB (ref >50)
LDL Cholesterol (Calc): 111 mg/dL (calc) — ABNORMAL HIGH
Non-HDL Cholesterol (Calc): 151 mg/dL (calc) — ABNORMAL HIGH (ref <130)
TRIGLYCERIDES: 280 mg/dL — AB (ref <150)

## 2018-01-13 LAB — CBC WITH DIFFERENTIAL/PLATELET
BASOS PCT: 0.7 %
Basophils Absolute: 69 cells/uL (ref 0–200)
EOS PCT: 2.4 %
Eosinophils Absolute: 238 cells/uL (ref 15–500)
HCT: 35.9 % (ref 35.0–45.0)
Hemoglobin: 11.2 g/dL — ABNORMAL LOW (ref 11.7–15.5)
Lymphs Abs: 3109 cells/uL (ref 850–3900)
MCH: 23.5 pg — ABNORMAL LOW (ref 27.0–33.0)
MCHC: 31.2 g/dL — ABNORMAL LOW (ref 32.0–36.0)
MCV: 75.4 fL — AB (ref 80.0–100.0)
MONOS PCT: 6.9 %
MPV: 11 fL (ref 7.5–12.5)
NEUTROS PCT: 58.6 %
Neutro Abs: 5801 cells/uL (ref 1500–7800)
PLATELETS: 291 10*3/uL (ref 140–400)
RBC: 4.76 10*6/uL (ref 3.80–5.10)
RDW: 14.6 % (ref 11.0–15.0)
TOTAL LYMPHOCYTE: 31.4 %
WBC mixed population: 683 cells/uL (ref 200–950)
WBC: 9.9 10*3/uL (ref 3.8–10.8)

## 2018-01-13 LAB — HEMOGLOBIN A1C
EAG (MMOL/L): 6.8 (calc)
Hgb A1c MFr Bld: 5.9 % of total Hgb — ABNORMAL HIGH (ref <5.7)
MEAN PLASMA GLUCOSE: 123 (calc)

## 2018-01-13 LAB — TSH: TSH: 2.54 mIU/L (ref 0.40–4.50)

## 2018-01-13 LAB — MAGNESIUM: Magnesium: 2.1 mg/dL (ref 1.5–2.5)

## 2018-01-13 LAB — INSULIN, RANDOM: Insulin: 19.9 u[IU]/mL — ABNORMAL HIGH (ref 2.0–19.6)

## 2018-01-13 LAB — VITAMIN D 25 HYDROXY (VIT D DEFICIENCY, FRACTURES): Vit D, 25-Hydroxy: 46 ng/mL (ref 30–100)

## 2018-01-16 ENCOUNTER — Encounter: Payer: Self-pay | Admitting: Internal Medicine

## 2018-01-16 MED ORDER — MONTELUKAST SODIUM 10 MG PO TABS: ORAL_TABLET | ORAL | 3 refills | Status: DC

## 2018-04-15 NOTE — Progress Notes (Signed)
FOLLOW UP  Assessment and Plan:   Hypertension Well controlled with current medications  Monitor blood pressure at home; patient to call if consistently greater than 130/80 Continue DASH diet.   Reminder to go to the ER if any CP, SOB, nausea, dizziness, severe HA, changes vision/speech, left arm numbness and tingling and jaw pain.  Cholesterol Currently above goal; rosuvastatin 20 mg every other day, discussed goal LDL <100 Continue low cholesterol diet and exercise.  Check lipid panel.   Prediabetes Continue diet and exercise.  Perform daily foot/skin check, notify office of any concerning changes.  Check A1C  Morbid Obesity with co morbidities Long discussion about weight loss, diet, and exercise Recommended diet heavy in fruits and veggies and low in animal meats, cheeses, and dairy products, appropriate calorie intake Discussed ideal weight for height and initial weight goal (225lb) Patient will work on logging calories more consistently, start exercise aiming to work up to 150 min/week Will follow up in 3 months  Vitamin D Def Below goal at last visit; she has not changed her dose continue supplementation to maintain goal of 70-100 Defer Vit D level  Anxiety Well managed by current regimen; continue medications, discussed may cut back to 1/2 tab after holidays if well controlled for attempt at taper Stress management techniques discussed, increase water, good sleep hygiene discussed, increase exercise, and increase veggies.   Exertional dyspnea Unclear etiology, may be multifactoral with decompensation playing significant role Getting CXR today as last 5 years ago ? Possible mild asthma, + fam hx Resent singulair per Dr. Idell Pickles recommendation, sample for anoro also given today to try after singulair and report back if beneficial Start exercise 5 min walks daily and work up to 30 min daily Refer to cards vs pulm if not improving  Continue diet and meds as  discussed. Further disposition pending results of labs. Discussed med's effects and SE's.   Over 30 minutes of exam, counseling, chart review, and critical decision making was performed.   Future Appointments  Date Time Provider [place]  07/07/2018  2:00 PM Unk Pinto, MD [facility] None    ----------------------------------------------------------------------------------------------------------------------  HPI 62 y.o. female  presents for 3 month follow up on hypertension, cholesterol, prediabetes, morbid obesity and vitamin D deficiency.   She is currently taking lexapro 20 mg daily that was started for work related stress/anxiety several years ago, doing much better since retiring earlier this year. Thinking about tapering off next year if doing well after the holidays.   She does endorse some recent exertional dyspnea, sense of not being able to get in a deep breath after exertion (climbing flights of stairs), taking longer than usual to catch her breath, ongoing for past 4-5 months. She denies any chest pain/pressure, fatigue, dizziness, wheezing, coughing. She believes this began after a mild URI end of summer for which she did not present for evaluation. She mentioned to Dr. Melford Aase at last visit and was prescribed singulair though shares this never made it in to pharmacy and never initiated. She denies personal history of asthma, but has son with this. She denies hx of smoking.   BMI is Body mass index is 42.84 kg/m., she has been working on diet, was doing noon weight loss, counting calories, currently working towards 1250 calories daily, usually goes a bit over. She admits she is not currently exercising.  Wt Readings from Last 3 Encounters:  04/19/18 238 lb (108 kg)  01/12/18 237 lb 6.4 oz (107.7 kg)  09/24/17 241 lb  9.6 oz (109.6 kg)   Her blood pressure has been controlled at home, today their BP is BP: 122/74  She does not workout. She denies chest pain,  shortness of breath, dizziness.   She is on cholesterol medication Rosuvastatin 20 mg every other day and denies myalgias. Her cholesterol is not at goal. The cholesterol last visit was:   Lab Results  Component Value Date   CHOL 193 01/12/2018   HDL 42 (L) 01/12/2018   LDLCALC 111 (H) 01/12/2018   TRIG 280 (H) 01/12/2018   CHOLHDL 4.6 01/12/2018    She has been working on diet prediabetes, and denies increased appetite, nausea, paresthesia of the feet, polydipsia, polyuria and visual disturbances. Last A1C in the office was:  Lab Results  Component Value Date   HGBA1C 5.9 (H) 01/12/2018   Patient is on Vitamin D supplement.   Lab Results  Component Value Date   VD25OH 46 01/12/2018        Current Medications:  Current Outpatient Medications on File Prior to Visit  Medication Sig  . atenolol (TENORMIN) 100 MG tablet Take 1 tablet (100 mg total) by mouth daily.  . Cholecalciferol (VITAMIN D PO) Take 5,000 Units by mouth daily.   Marland Kitchen OVER THE COUNTER MEDICATION Patient takes OTC Ranitidine 150 mg PRN.  . rosuvastatin (CRESTOR) 20 MG tablet TAKE 1 TABLET BY MOUTH DAILY FOR CHOLESTEROL.   No current facility-administered medications on file prior to visit.      Allergies:  Allergies  Allergen Reactions  . Lipitor [Atorvastatin]   . Wellbutrin [Bupropion]     dysphoria     Medical History:  Past Medical History:  Diagnosis Date  . B12 deficiency   . Hyperlipidemia   . Hypertension   . Obesity   . Prediabetes   . Vitamin D deficiency    Family history- Reviewed and unchanged Social history- Reviewed and unchanged   Review of Systems:  Review of Systems  Constitutional: Negative for malaise/fatigue and weight loss.  HENT: Negative for hearing loss and tinnitus.   Eyes: Negative for blurred vision and double vision.  Respiratory: Positive for shortness of breath (exertional). Negative for cough and wheezing.   Cardiovascular: Negative for chest pain,  palpitations, orthopnea, claudication and leg swelling.  Gastrointestinal: Negative for abdominal pain, blood in stool, constipation, diarrhea, heartburn, melena, nausea and vomiting.  Genitourinary: Negative.   Musculoskeletal: Negative for joint pain and myalgias.  Skin: Negative for rash.  Neurological: Negative for dizziness, tingling, sensory change, weakness and headaches.  Endo/Heme/Allergies: Negative for polydipsia.  Psychiatric/Behavioral: Negative.   All other systems reviewed and are negative.    Physical Exam: BP 122/74   Pulse 62   Temp (!) 97.3 F (36.3 C)   Ht 5' 2.5" (1.588 m)   Wt 238 lb (108 kg)   SpO2 96%   BMI 42.84 kg/m  Wt Readings from Last 3 Encounters:  04/19/18 238 lb (108 kg)  01/12/18 237 lb 6.4 oz (107.7 kg)  09/24/17 241 lb 9.6 oz (109.6 kg)   General Appearance: Well nourished, obese female, in no apparent distress. Eyes: PERRLA, EOMs, conjunctiva no swelling or erythema Sinuses: No Frontal/maxillary tenderness ENT/Mouth: Ext aud canals clear, TMs without erythema, bulging. No erythema, swelling, or exudate on post pharynx.  Tonsils not swollen or erythematous. Hearing normal.  Neck: Supple, thyroid normal.  Respiratory: Respiratory effort normal, BS equal bilaterally without rales, rhonchi, wheezing or stridor.  Cardio: RRR with no MRGs. Brisk peripheral pulses without edema.  Abdomen: Soft, + BS.  Non tender, no guarding, rebound, hernias, masses. Lymphatics: Non tender without lymphadenopathy.  Musculoskeletal: Full ROM, 5/5 strength, Normal gait Skin: Warm, dry without rashes, lesions, ecchymosis.  Neuro: Cranial nerves intact. No cerebellar symptoms.  Psych: Awake and oriented X 3, normal affect, Insight and Judgment appropriate.    Izora Ribas, NP 11:21 AM [facility]

## 2018-04-19 ENCOUNTER — Ambulatory Visit: Payer: BC Managed Care – PPO | Admitting: Adult Health

## 2018-04-19 ENCOUNTER — Encounter: Payer: Self-pay | Admitting: Adult Health

## 2018-04-19 VITALS — BP 122/74 | HR 62 | Temp 97.3°F | Ht 62.5 in | Wt 238.0 lb

## 2018-04-19 DIAGNOSIS — E559 Vitamin D deficiency, unspecified: Secondary | ICD-10-CM | POA: Diagnosis not present

## 2018-04-19 DIAGNOSIS — R7303 Prediabetes: Secondary | ICD-10-CM | POA: Diagnosis not present

## 2018-04-19 DIAGNOSIS — R0609 Other forms of dyspnea: Secondary | ICD-10-CM

## 2018-04-19 DIAGNOSIS — E782 Mixed hyperlipidemia: Secondary | ICD-10-CM

## 2018-04-19 DIAGNOSIS — I10 Essential (primary) hypertension: Secondary | ICD-10-CM

## 2018-04-19 DIAGNOSIS — Z79899 Other long term (current) drug therapy: Secondary | ICD-10-CM

## 2018-04-19 DIAGNOSIS — F419 Anxiety disorder, unspecified: Secondary | ICD-10-CM

## 2018-04-19 MED ORDER — MONTELUKAST SODIUM 10 MG PO TABS: ORAL_TABLET | ORAL | 3 refills | Status: DC

## 2018-04-19 MED ORDER — ESCITALOPRAM OXALATE 20 MG PO TABS: 20.0000 mg | ORAL_TABLET | Freq: Every day | ORAL | 4 refills | Status: DC

## 2018-04-19 NOTE — Patient Instructions (Addendum)
Goals    . Exercise 3x per week (30 min per time)    . Weight (lb) < 225 lb (102.1 kg)       Know what a healthy weight is for you (roughly BMI <25) and aim to maintain this  Aim for 7+ servings of fruits and vegetables daily  65-80+ fluid ounces of water or unsweet tea for healthy kidneys  Limit to max 1 drink of alcohol per day; avoid smoking/tobacco  Limit animal fats in diet for cholesterol and heart health - choose grass fed whenever available  Avoid highly processed foods, and foods high in saturated/trans fats  Aim for low stress - take time to unwind and care for your mental health  Aim for 150 min of moderate intensity exercise weekly for heart health, and weights twice weekly for bone health  Aim for 7-9 hours of sleep daily     Drink 1/2 your body weight in fluid ounces of water daily; drink a tall glass of water 30 min before meals  Don't eat until you're stuffed- listen to your stomach and eat until you are 80% full   Try eating off of a salad plate; wait 10 min after finishing before going back for seconds  Start by eating the vegetables on your plate; aim for 50% of your meals to be fruits or vegetables  Then eat your protein - lean meats (grass fed if possible), fish, beans, nuts in moderation  Eat your carbs/starch last ONLY if you still are hungry. If you can, stop before finishing it all  Avoid sugar and flour - the closer it looks to it's original form in nature, typically the better it is for you  Splurge in moderation - "assign" days when you get to splurge and have the "bad stuff" - I like to follow a 80% - 20% plan- "good" choices 80 % of the time, "bad" choices in moderation 20% of the time  Simple equation is: Calories out > calories in = weight loss - even if you eat the bad stuff, if you limit portions, you will still lose weight      Shortness of Breath, Adult Shortness of breath is when a person has trouble breathing enough air, or  when a person feels like she or he is having trouble breathing in enough air. Shortness of breath could be a sign of medical problem. Follow these instructions at home: Pay attention to any changes in your symptoms. Take these actions to help with your condition:  Do not smoke. Smoking is a common cause of shortness of breath. If you smoke and you need help quitting, ask your health care provider.  Avoid things that can irritate your airways, such as: ? Mold. ? Dust. ? Air pollution. ? Chemical fumes. ? Things that can cause allergy symptoms (allergens), if you have allergies.  Keep your living space clean and free of mold and dust.  Rest as needed. Slowly return to your usual activities.  Take over-the-counter and prescription medicines, including oxygen and inhaled medicines, only as told by your health care provider.  Keep all follow-up visits as told by your health care provider. This is important.  Contact a health care provider if:  Your condition does not improve as soon as expected.  You have a hard time doing your normal activities, even after you rest.  You have new symptoms. Get help right away if:  Your shortness of breath gets worse.  You have shortness of  breath when you are resting.  You feel light-headed or you faint.  You have a cough that is not controlled with medicines.  You cough up blood.  You have pain with breathing.  You have pain in your chest, arms, shoulders, or abdomen.  You have a fever.  You cannot walk up stairs or exercise the way that you normally do. This information is not intended to replace advice given to you by your health care provider. Make sure you discuss any questions you have with your health care provider. Document Released: 02/11/2001 Document Revised: 12/08/2015 Document Reviewed: 10/25/2015 Elsevier Interactive Patient Education  [employer].

## 2018-04-20 ENCOUNTER — Other Ambulatory Visit: Payer: Self-pay | Admitting: Adult Health

## 2018-04-20 ENCOUNTER — Ambulatory Visit: Payer: BC Managed Care – PPO | Admitting: *Deleted

## 2018-04-20 ENCOUNTER — Encounter: Payer: Self-pay | Admitting: Adult Health

## 2018-04-20 VITALS — BP 120/76 | HR 76 | Temp 97.3°F | Resp 16 | Ht 62.5 in | Wt 238.0 lb

## 2018-04-20 DIAGNOSIS — E782 Mixed hyperlipidemia: Secondary | ICD-10-CM

## 2018-04-20 DIAGNOSIS — I10 Essential (primary) hypertension: Secondary | ICD-10-CM

## 2018-04-20 DIAGNOSIS — D649 Anemia, unspecified: Secondary | ICD-10-CM | POA: Insufficient documentation

## 2018-04-20 LAB — CBC WITH DIFFERENTIAL/PLATELET
BASOS PCT: 1 %
Basophils Absolute: 78 cells/uL (ref 0–200)
Basophils Absolute: 61 {cells}/uL (ref 0–200)
Basophils Relative: 0.7 %
EOS ABS: 265 {cells}/uL (ref 15–500)
Eosinophils Absolute: 226 {cells}/uL (ref 15–500)
Eosinophils Relative: 3.4 %
Eosinophils Relative: 2.6 %
HCT: 30.2 % — ABNORMAL LOW (ref 35.0–45.0)
HCT: 29.3 % — ABNORMAL LOW (ref 35.0–45.0)
HEMOGLOBIN: 8.9 g/dL — AB (ref 11.7–15.5)
Hemoglobin: 8.6 g/dL — ABNORMAL LOW (ref 11.7–15.5)
Lymphs Abs: 2371 cells/uL (ref 850–3900)
Lymphs Abs: 2366 {cells}/uL (ref 850–3900)
MCH: 21.5 pg — AB (ref 27.0–33.0)
MCH: 21.6 pg — ABNORMAL LOW (ref 27.0–33.0)
MCHC: 29.5 g/dL — ABNORMAL LOW (ref 32.0–36.0)
MCHC: 29.4 g/dL — ABNORMAL LOW (ref 32.0–36.0)
MCV: 73.1 fL — ABNORMAL LOW (ref 80.0–100.0)
MCV: 73.6 fL — ABNORMAL LOW (ref 80.0–100.0)
MONOS PCT: 5.9 %
MPV: 11 fL (ref 7.5–12.5)
MPV: 11 fL (ref 7.5–12.5)
Monocytes Relative: 6.9 %
NEUTROS ABS: 4625 {cells}/uL (ref 1500–7800)
Neutro Abs: 5446 {cells}/uL (ref 1500–7800)
Neutrophils Relative %: 59.3 %
Neutrophils Relative %: 62.6 %
Platelets: 283 10*3/uL (ref 140–400)
Platelets: 262 Thousand/uL (ref 140–400)
RBC: 4.13 10*6/uL (ref 3.80–5.10)
RBC: 3.98 Million/uL (ref 3.80–5.10)
RDW: 14.8 % (ref 11.0–15.0)
RDW: 14.7 % (ref 11.0–15.0)
Total Lymphocyte: 30.4 %
Total Lymphocyte: 27.2 %
WBC mixed population: 600 {cells}/uL (ref 200–950)
WBC: 7.8 10*3/uL (ref 3.8–10.8)
WBC: 8.7 Thousand/uL (ref 3.8–10.8)
WBCMIX: 460 {cells}/uL (ref 200–950)

## 2018-04-20 LAB — IRON,TIBC AND FERRITIN PANEL
%SAT: 4 % — ABNORMAL LOW (ref 16–45)
Ferritin: 4 ng/mL — ABNORMAL LOW (ref 16–288)
Iron: 23 ug/dL — ABNORMAL LOW (ref 45–160)
TIBC: 527 ug/dL — ABNORMAL HIGH (ref 250–450)

## 2018-04-20 LAB — LIPID PANEL
CHOLESTEROL: 191 mg/dL (ref <200)
HDL: 43 mg/dL — AB (ref >50)
LDL CHOLESTEROL (CALC): 120 mg/dL — AB
Non-HDL Cholesterol (Calc): 148 mg/dL (calc) — ABNORMAL HIGH (ref <130)
TRIGLYCERIDES: 161 mg/dL — AB (ref <150)
Total CHOL/HDL Ratio: 4.4 (calc) (ref <5.0)

## 2018-04-20 LAB — COMPLETE METABOLIC PANEL WITH GFR
AG RATIO: 1.6 (calc) (ref 1.0–2.5)
ALKALINE PHOSPHATASE (APISO): 75 U/L (ref 33–130)
ALT: 11 U/L (ref 6–29)
AST: 14 U/L (ref 10–35)
Albumin: 4.2 g/dL (ref 3.6–5.1)
BUN: 14 mg/dL (ref 7–25)
CALCIUM: 9.6 mg/dL (ref 8.6–10.4)
CHLORIDE: 103 mmol/L (ref 98–110)
CO2: 29 mmol/L (ref 20–32)
Creat: 0.79 mg/dL (ref 0.50–0.99)
GFR, EST AFRICAN AMERICAN: 93 mL/min/{1.73_m2} (ref >=60)
GFR, Est Non African American: 80 mL/min/{1.73_m2} (ref >=60)
GLUCOSE: 89 mg/dL (ref 65–99)
Globulin: 2.7 g/dL (calc) (ref 1.9–3.7)
POTASSIUM: 4.3 mmol/L (ref 3.5–5.3)
Sodium: 140 mmol/L (ref 135–146)
TOTAL PROTEIN: 6.9 g/dL (ref 6.1–8.1)
Total Bilirubin: 0.4 mg/dL (ref 0.2–1.2)

## 2018-04-20 LAB — TEST AUTHORIZATION

## 2018-04-20 LAB — RETICULOCYTES
ABS RETIC: 83580 {cells}/uL — AB (ref 20000–8000)
Retic Ct Pct: 2.1 %

## 2018-04-20 LAB — TSH: TSH: 3.29 m[IU]/L (ref 0.40–4.50)

## 2018-04-20 LAB — HEMOGLOBIN A1C
HEMOGLOBIN A1C: 5.8 %{Hb} — AB (ref <5.7)
Mean Plasma Glucose: 120 (calc)
eAG (mmol/L): 6.6 (calc)

## 2018-04-20 NOTE — Patient Instructions (Addendum)
Patient is here for repeat labs due to a drop in her hemoglobin.  She states she has not been fatigued or dizzy.  She is not having dark stools.  She was contacted by Dr Liliane Channel office to schedule a colonoscopy, but did not respond.  The patient states she will call and schedule her colonoscopy. She was advised to stop ASA and Nsaids.

## 2018-04-21 ENCOUNTER — Encounter: Payer: Self-pay | Admitting: Internal Medicine

## 2018-04-21 ENCOUNTER — Other Ambulatory Visit: Payer: Self-pay | Admitting: Adult Health

## 2018-04-21 DIAGNOSIS — Z1211 Encounter for screening for malignant neoplasm of colon: Secondary | ICD-10-CM

## 2018-04-21 DIAGNOSIS — D509 Iron deficiency anemia, unspecified: Secondary | ICD-10-CM

## 2018-04-23 ENCOUNTER — Other Ambulatory Visit: Payer: Self-pay

## 2018-04-23 ENCOUNTER — Telehealth: Payer: Self-pay

## 2018-04-23 ENCOUNTER — Other Ambulatory Visit: Payer: BC Managed Care – PPO

## 2018-04-23 DIAGNOSIS — D649 Anemia, unspecified: Secondary | ICD-10-CM

## 2018-04-23 DIAGNOSIS — I10 Essential (primary) hypertension: Secondary | ICD-10-CM | POA: Diagnosis not present

## 2018-04-23 LAB — CBC WITH DIFFERENTIAL/PLATELET
BASOS ABS: 64 {cells}/uL (ref 0–200)
Basophils Relative: 0.7 %
Eosinophils Absolute: 294 cells/uL (ref 15–500)
Eosinophils Relative: 3.2 %
HCT: 31 % — ABNORMAL LOW (ref 35.0–45.0)
HEMOGLOBIN: 9.2 g/dL — AB (ref 11.7–15.5)
Lymphs Abs: 2374 cells/uL (ref 850–3900)
MCH: 21.5 pg — AB (ref 27.0–33.0)
MCHC: 29.7 g/dL — AB (ref 32.0–36.0)
MCV: 72.4 fL — ABNORMAL LOW (ref 80.0–100.0)
MONOS PCT: 6.6 %
MPV: 10.6 fL (ref 7.5–12.5)
NEUTROS ABS: 5860 {cells}/uL (ref 1500–7800)
NEUTROS PCT: 63.7 %
PLATELETS: 281 10*3/uL (ref 140–400)
RBC: 4.28 10*6/uL (ref 3.80–5.10)
RDW: 15 % (ref 11.0–15.0)
TOTAL LYMPHOCYTE: 25.8 %
WBC mixed population: 607 cells/uL (ref 200–950)
WBC: 9.2 10*3/uL (ref 3.8–10.8)

## 2018-04-23 NOTE — Telephone Encounter (Signed)
Following up with patient to see if she has seen GI yet. Is there any surgery scheduled?

## 2018-04-26 DIAGNOSIS — E785 Hyperlipidemia, unspecified: Secondary | ICD-10-CM | POA: Insufficient documentation

## 2018-04-26 DIAGNOSIS — K219 Gastro-esophageal reflux disease without esophagitis: Secondary | ICD-10-CM | POA: Insufficient documentation

## 2018-05-03 ENCOUNTER — Other Ambulatory Visit: Payer: Self-pay | Admitting: Adult Health

## 2018-05-03 ENCOUNTER — Telehealth: Payer: Self-pay

## 2018-05-03 DIAGNOSIS — D509 Iron deficiency anemia, unspecified: Secondary | ICD-10-CM

## 2018-05-03 NOTE — Telephone Encounter (Signed)
Patient states that she had gotten an endoscopy/colonoscopy last week and the results were normal

## 2018-05-04 NOTE — Telephone Encounter (Signed)
Patient scheduled for a lab only visit for one month

## 2018-05-27 ENCOUNTER — Encounter: Payer: Self-pay | Admitting: Internal Medicine

## 2018-06-07 ENCOUNTER — Other Ambulatory Visit: Payer: BC Managed Care – PPO

## 2018-06-07 DIAGNOSIS — D509 Iron deficiency anemia, unspecified: Secondary | ICD-10-CM

## 2018-06-07 DIAGNOSIS — I10 Essential (primary) hypertension: Secondary | ICD-10-CM

## 2018-06-07 LAB — CBC WITH DIFFERENTIAL/PLATELET
ABSOLUTE MONOCYTES: 480 {cells}/uL (ref 200–950)
Basophils Absolute: 53 cells/uL (ref 0–200)
Basophils Relative: 0.7 %
EOS ABS: 173 {cells}/uL (ref 15–500)
Eosinophils Relative: 2.3 %
HCT: 37.2 % (ref 35.0–45.0)
Hemoglobin: 11.2 g/dL — ABNORMAL LOW (ref 11.7–15.5)
Lymphs Abs: 2235 cells/uL (ref 850–3900)
MCH: 23.3 pg — AB (ref 27.0–33.0)
MCHC: 30.1 g/dL — ABNORMAL LOW (ref 32.0–36.0)
MCV: 77.5 fL — AB (ref 80.0–100.0)
MONOS PCT: 6.4 %
MPV: 11.3 fL (ref 7.5–12.5)
NEUTROS PCT: 60.8 %
Neutro Abs: 4560 cells/uL (ref 1500–7800)
PLATELETS: 227 10*3/uL (ref 140–400)
RBC: 4.8 10*6/uL (ref 3.80–5.10)
RDW: 20.9 % — AB (ref 11.0–15.0)
TOTAL LYMPHOCYTE: 29.8 %
WBC: 7.5 10*3/uL (ref 3.8–10.8)

## 2018-06-24 LAB — HM DEXA SCAN

## 2018-06-25 LAB — HM MAMMOGRAPHY

## 2018-06-28 ENCOUNTER — Encounter: Payer: Self-pay | Admitting: Internal Medicine

## 2018-07-07 ENCOUNTER — Encounter: Payer: Self-pay | Admitting: Internal Medicine

## 2018-07-07 ENCOUNTER — Ambulatory Visit: Payer: BC Managed Care – PPO | Admitting: Internal Medicine

## 2018-07-07 VITALS — BP 128/76 | HR 62 | Temp 97.5°F | Ht 62.0 in | Wt 237.0 lb

## 2018-07-07 DIAGNOSIS — Z1211 Encounter for screening for malignant neoplasm of colon: Secondary | ICD-10-CM

## 2018-07-07 DIAGNOSIS — Z130 Encounter for screening for diseases of the blood and blood-forming organs and certain disorders involving the immune mechanism: Secondary | ICD-10-CM

## 2018-07-07 DIAGNOSIS — R7303 Prediabetes: Secondary | ICD-10-CM

## 2018-07-07 DIAGNOSIS — E782 Mixed hyperlipidemia: Secondary | ICD-10-CM

## 2018-07-07 DIAGNOSIS — I10 Essential (primary) hypertension: Secondary | ICD-10-CM

## 2018-07-07 DIAGNOSIS — R5383 Other fatigue: Secondary | ICD-10-CM

## 2018-07-07 DIAGNOSIS — Z1212 Encounter for screening for malignant neoplasm of rectum: Secondary | ICD-10-CM

## 2018-07-07 DIAGNOSIS — Z1389 Encounter for screening for other disorder: Secondary | ICD-10-CM

## 2018-07-07 DIAGNOSIS — Z13220 Encounter for screening for lipoid disorders: Secondary | ICD-10-CM

## 2018-07-07 DIAGNOSIS — Z131 Encounter for screening for diabetes mellitus: Secondary | ICD-10-CM

## 2018-07-07 DIAGNOSIS — Z136 Encounter for screening for cardiovascular disorders: Secondary | ICD-10-CM

## 2018-07-07 DIAGNOSIS — Z1329 Encounter for screening for other suspected endocrine disorder: Secondary | ICD-10-CM

## 2018-07-07 DIAGNOSIS — Z111 Encounter for screening for respiratory tuberculosis: Secondary | ICD-10-CM

## 2018-07-07 DIAGNOSIS — Z0000 Encounter for general adult medical examination without abnormal findings: Secondary | ICD-10-CM

## 2018-07-07 DIAGNOSIS — Z8249 Family history of ischemic heart disease and other diseases of the circulatory system: Secondary | ICD-10-CM

## 2018-07-07 DIAGNOSIS — Z0001 Encounter for general adult medical examination with abnormal findings: Secondary | ICD-10-CM

## 2018-07-07 DIAGNOSIS — Z79899 Other long term (current) drug therapy: Secondary | ICD-10-CM

## 2018-07-07 DIAGNOSIS — E559 Vitamin D deficiency, unspecified: Secondary | ICD-10-CM

## 2018-07-07 MED ORDER — PHENTERMINE HCL 37.5 MG PO TABS: ORAL_TABLET | ORAL | 5 refills | Status: DC

## 2018-07-07 NOTE — Progress Notes (Signed)
[facility] Unk Pinto, M.D.     Uvaldo Bristle. Silverio Lay, P.A.-C Liane Comber, [place] [address] [place], [place] [address] Telephone [phone] Telefax [phone] Annual Screening/Preventative Visit & Comprehensive Evaluation &  Examination     This very nice 63 y.o. MWF presents for a Screening /Preventative Visit & comprehensive evaluation and management of multiple medical co-morbidities.  Patient has been followed for HTN, HLD, Prediabetes  and Vitamin D Deficiency.      HTN predates circa 2005. Patient's BP has been controlled at home and patient denies any cardiac symptoms as chest pain, palpitations, shortness of breath, dizziness or ankle swelling. Today's BP is at goal - 128/76      Patient's hyperlipidemia is controlled with diet and medications. Patient denies myalgias or other medication SE's. Last lipids were not at goal: Lab Results  Component Value Date   CHOL 191 04/19/2018   HDL 43 (L) 04/19/2018   LDLCALC 120 (H) 04/19/2018   TRIG 161 (H) 04/19/2018   CHOLHDL 4.4 04/19/2018      Patient has hx/o prediabetes  (A1c 5.9% / 2011, 6.0% / 2012 & 5.8% / 2016) and patient denies reactive hypoglycemic symptoms, visual blurring, diabetic polys or paresthesias. Last A1c was not at goal: Lab Results  Component Value Date   HGBA1C 5.8 (H) 04/19/2018      Finally, patient has history of Vitamin D Deficiency ("20" / 2008 &"34" / 2013)  and last Vitamin D was still not at goal: Lab Results  Component Value Date   VD25OH 46 01/12/2018   Current Outpatient Medications on File Prior to Visit  Medication Sig  . alendronate (FOSAMAX) 70 MG tablet Take 70 mg by mouth once a week. Take with a full glass of water on an empty stomach.  Marland Kitchen atenolol (TENORMIN) 100 MG tablet Take 1 tablet (100 mg total) by mouth daily.  . Cholecalciferol (VITAMIN D PO) Take 5,000 Units by mouth daily.   Marland Kitchen escitalopram  (LEXAPRO) 20 MG tablet Take 1 tablet (20 mg total) by mouth daily.  . montelukast (SINGULAIR) 10 MG tablet Take 1 tablet daily for Allergies  . OVER THE COUNTER MEDICATION Patient takes OTC Ranitidine 150 mg PRN.  . rosuvastatin (CRESTOR) 20 MG tablet TAKE 1 TABLET BY MOUTH DAILY FOR CHOLESTEROL.   No current facility-administered medications on file prior to visit.    Allergies  Allergen Reactions  . Lipitor [Atorvastatin]   . Wellbutrin [Bupropion]     dysphoria   Past Medical History:  Diagnosis Date  . B12 deficiency   . Hyperlipidemia   . Hypertension   . Obesity   . Prediabetes   . Vitamin D deficiency    Health Maintenance  Topic Date Due  . PAP SMEAR-Modifier  01/24/1977  . MAMMOGRAM  11/30/2014  . INFLUENZA VACCINE  12/31/2017  . TETANUS/TDAP  06/19/2027  . COLONOSCOPY  04/26/2028  . Hepatitis C Screening  Completed  . HIV Screening  Completed   Immunization History  Administered Date(s) Administered  . Influenza-Unspecified 04/28/2015, 02/26/2016  . PPD Test 01/31/2014, 02/12/2015, 03/10/2016, 06/18/2017  . Pneumococcal-Unspecified 06/17/2008  . Td 04/18/2007  . Tdap 06/18/2017   Last Colon - 11/256/2019 - Dr Earlean Shawl  Last MGM  & dexaBMD 06/24/2018 at [facility]  Past Surgical History:  Procedure Laterality Date  . APPENDECTOMY    . BREAST BIOPSY Right 1975  . INGUINAL HERNIA REPAIR Bilateral age 57   Family  History  Problem Relation Age of Onset  . Hypertension Mother   . Alzheimer's disease Mother   . Hypertension Father   . Alzheimer's disease Father    Social History   Tobacco Use  . Smoking status: Never Smoker  . Smokeless tobacco: Never Used  Substance Use Topics  . Alcohol use: Yes    Alcohol/week: 2.0 standard drinks    Types: 2 Standard drinks or equivalent per week    Comment: wine  . Drug use: No    ROS Constitutional: Denies fever, chills, weight loss/gain, headaches, insomnia,  night sweats, and change in  appetite. Does c/o fatigue. Eyes: Denies redness, blurred vision, diplopia, discharge, itchy, watery eyes.  ENT: Denies discharge, congestion, post nasal drip, epistaxis, sore throat, earache, hearing loss, dental pain, Tinnitus, Vertigo, Sinus pain, snoring.  Cardio: Denies chest pain, palpitations, irregular heartbeat, syncope, dyspnea, diaphoresis, orthopnea, PND, claudication, edema Respiratory: denies cough, dyspnea, DOE, pleurisy, hoarseness, laryngitis, wheezing.  Gastrointestinal: Denies dysphagia, heartburn, reflux, water brash, pain, cramps, nausea, vomiting, bloating, diarrhea, constipation, hematemesis, melena, hematochezia, jaundice, hemorrhoids Genitourinary: Denies dysuria, frequency, urgency, nocturia, hesitancy, discharge, hematuria, flank pain Breast: Breast lumps, nipple discharge, bleeding.  Musculoskeletal: Denies arthralgia, myalgia, stiffness, Jt. Swelling, pain, limp, and strain/sprain. Denies falls. Skin: Denies puritis, rash, hives, warts, acne, eczema, changing in skin lesion Neuro: No weakness, tremor, incoordination, spasms, paresthesia, pain Psychiatric: Denies confusion, memory loss, sensory loss. Denies Depression. Endocrine: Denies change in weight, skin, hair change, nocturia, and paresthesia, diabetic polys, visual blurring, hyper / hypo glycemic episodes.  Heme/Lymph: No excessive bleeding, bruising, enlarged lymph nodes.  Physical Exam  BP 128/76   Pulse 62   Temp (!) 97.5 F (36.4 C)   Ht 5\' 2"  (1.575 m)   Wt 237 lb (107.5 kg)   SpO2 98%   BMI 43.35 kg/m   General Appearance: Well nourished, well groomed and in no apparent distress.  Eyes: PERRLA, EOMs, conjunctiva no swelling or erythema, normal fundi and vessels. Sinuses: No frontal/maxillary tenderness ENT/Mouth: EACs patent / TMs  nl. Nares clear without erythema, swelling, mucoid exudates. Oral hygiene is good. No erythema, swelling, or exudate. Tongue normal, non-obstructing. Tonsils not  swollen or erythematous. Hearing normal.  Neck: Supple, thyroid not palpable. No bruits, nodes or JVD. Respiratory: Respiratory effort normal.  BS equal and clear bilateral without rales, rhonci, wheezing or stridor. Cardio: Heart sounds are normal with regular rate and rhythm and no murmurs, rubs or gallops. Peripheral pulses are normal and equal bilaterally without edema. No aortic or femoral bruits. Chest: symmetric with normal excursions and percussion. Breasts:  Deferred to GYN  Abdomen: Rotund, soft with bowel sounds active. Nontender, no guarding, rebound, hernias, masses, or organomegaly.  Lymphatics: Non tender without lymphadenopathy.  Musculoskeletal: Full ROM all peripheral extremities, joint stability, 5/5 strength, and normal gait. Skin: Warm and dry without rashes, lesions, cyanosis, clubbing or  ecchymosis.  Neuro: Cranial nerves intact, reflexes equal bilaterally. Normal muscle tone, no cerebellar symptoms. Sensation intact.  Pysch: Alert and oriented X 3, normal affect, Insight and Judgment appropriate.   Assessment and Plan  1. Annual Preventative Screening Examination  2. Essential hypertension  - EKG 12-Lead - [place], RETROPERITNL ABD,  LTD - Urinalysis, Routine w reflex microscopic - Microalbumin / creatinine urine ratio - CBC with Differential/Platelet - COMPLETE METABOLIC PANEL WITH GFR - Magnesium - TSH  3. Hyperlipidemia, mixed  - EKG 12-Lead - [place], RETROPERITNL ABD,  LTD - Lipid panel - TSH  4. Prediabetes  - EKG  12-Lead - [place], RETROPERITNL ABD,  LTD - Hemoglobin A1c - Insulin, random  5. Vitamin D deficiency  - VITAMIN D 25 Hydroxyl  6. Screening for colorectal cancer  - POC Hemoccult Bld/Stl  7. Screening for ischemic heart disease  - EKG 12-Lead  8. FH: hypertension  - EKG 12-Lead - [place], RETROPERITNL ABD,  LTD  9. Screening for AAA (aortic abdominal aneurysm)  - [place], RETROPERITNL ABD,  LTD  10. Morbid obesity (BMI 41.66)    11.  Fatigue, unspecified type  - Iron,Total/Total Iron Binding Cap - Vitamin B12  12. Medication management  - Urinalysis, Routine w reflex microscopic - Microalbumin / creatinine urine ratio - CBC with Differential/Platelet - COMPLETE METABOLIC PANEL WITH GFR - Magnesium - Lipid panel - TSH - Hemoglobin A1c - Insulin, random - VITAMIN D 25 Hydroxyl   13. Screening-pulmonary TB  - TB Skin Test     Patient was counseled in prudent diet to achieve/maintain BMI less than 25 for weight control, BP monitoring, regular exercise and medications. Discussed med's effects and SE's. Screening labs and tests as requested with regular follow-up as recommended. Over 40 minutes of exam, counseling, chart review and high complex critical decision making was performed.

## 2018-07-07 NOTE — Patient Instructions (Addendum)
Preventive Care for Adults  A healthy lifestyle and preventive care can promote health and wellness. Preventive health guidelines for women include the following key practices.  A routine yearly physical is a good way to check with your health care provider about your health and preventive screening. It is a chance to share any concerns and updates on your health and to receive a thorough exam.  Visit your dentist for a routine exam and preventive care every 6 months. Brush your teeth twice a day and floss once a day. Good oral hygiene prevents tooth decay and gum disease.  The frequency of eye exams is based on your age, health, family medical history, use of contact lenses, and other factors. Follow your health care provider's recommendations for frequency of eye exams.  Eat a healthy diet. Foods like vegetables, fruits, whole grains, low-fat dairy products, and lean protein foods contain the nutrients you need without too many calories. Decrease your intake of foods high in solid fats, added sugars, and salt. Eat the right amount of calories for you. Get information about a proper diet from your health care provider, if necessary.  Regular physical exercise is one of the most important things you can do for your health. Most adults should get at least 150 minutes of moderate-intensity exercise (any activity that increases your heart rate and causes you to sweat) each week. In addition, most adults need muscle-strengthening exercises on 2 or more days a week.  Maintain a healthy weight. The body mass index (BMI) is a screening tool to identify possible weight problems. It provides an estimate of body fat based on height and weight. Your health care provider can find your BMI and can help you achieve or maintain a healthy weight. For adults 20 years and older:  A BMI below 18.5 is considered underweight.  A BMI of 18.5 to 24.9 is normal.  A BMI of 25 to 29.9 is considered overweight.  A BMI of  30 and above is considered obese.  Maintain normal blood lipids and cholesterol levels by exercising and minimizing your intake of saturated fat. Eat a balanced diet with plenty of fruit and vegetables. Blood tests for lipids and cholesterol should begin at age 53 and be repeated every 5 years. If your lipid or cholesterol levels are high, you are over 50, or you are at high risk for heart disease, you may need your cholesterol levels checked more frequently. Ongoing high lipid and cholesterol levels should be treated with medicines if diet and exercise are not working.  If you smoke, find out from your health care provider how to quit. If you do not use tobacco, do not start.  Lung cancer screening is recommended for adults aged 48-80 years who are at high risk for developing lung cancer because of a history of smoking. A yearly low-dose CT scan of the lungs is recommended for people who have at least a 30-pack-year history of smoking and are a current smoker or have quit within the past 15 years. A pack year of smoking is smoking an average of 1 pack of cigarettes a day for 1 year (for example: 1 pack a day for 30 years or 2 packs a day for 15 years). Yearly screening should continue until the smoker has stopped smoking for at least 15 years. Yearly screening should be stopped for people who develop a health problem that would prevent them from having lung cancer treatment.  High blood pressure causes heart disease and increases  the risk of stroke. Your blood pressure should be checked at least every 1 to 2 years. Ongoing high blood pressure should be treated with medicines if weight loss and exercise do not work.  If you are 63-27 years old, ask your health care provider if you should take aspirin to prevent strokes.  Diabetes screening involves taking a blood sample to check your fasting blood sugar level. This should be done once every 3 years, after age 54, if you are within normal weight and  without risk factors for diabetes. Testing should be considered at a younger age or be carried out more frequently if you are overweight and have at least 1 risk factor for diabetes.  Breast cancer screening is essential preventive care for women. You should practice "breast self-awareness." This means understanding the normal appearance and feel of your breasts and may include breast self-examination. Any changes detected, no matter how small, should be reported to a health care provider. Women in their 26s and 30s should have a clinical breast exam (CBE) by a health care provider as part of a regular health exam every 1 to 3 years. After age 84, women should have a CBE every year. Starting at age 56, women should consider having a mammogram (breast X-ray test) every year. Women who have a family history of breast cancer should talk to their health care provider about genetic screening. Women at a high risk of breast cancer should talk to their health care providers about having an MRI and a mammogram every year.  Breast cancer gene (BRCA)-related cancer risk assessment is recommended for women who have family members with BRCA-related cancers. BRCA-related cancers include breast, ovarian, tubal, and peritoneal cancers. Having family members with these cancers may be associated with an increased risk for harmful changes (mutations) in the breast cancer genes BRCA1 and BRCA2. Results of the assessment will determine the need for genetic counseling and BRCA1 and BRCA2 testing.  Routine pelvic exams to screen for cancer are no longer recommended for nonpregnant women who are considered low risk for cancer of the pelvic organs (ovaries, uterus, and vagina) and who do not have symptoms. Ask your health care provider if a screening pelvic exam is right for you.  If you have had past treatment for cervical cancer or a condition that could lead to cancer, you need Pap tests and screening for cancer for at least 20  years after your treatment. If Pap tests have been discontinued, your risk factors (such as having a new sexual partner) need to be reassessed to determine if screening should be resumed. Some women have medical problems that increase the chance of getting cervical cancer. In these cases, your health care provider may recommend more frequent screening and Pap tests.  Colorectal cancer can be detected and often prevented. Most routine colorectal cancer screening begins at the age of 63 years and continues through age 44 years. However, your health care provider may recommend screening at an earlier age if you have risk factors for colon cancer. On a yearly basis, your health care provider may provide home test kits to check for hidden blood in the stool. Use of a small camera at the end of a tube, to directly examine the colon (sigmoidoscopy or colonoscopy), can detect the earliest forms of colorectal cancer. Talk to your health care provider about this at age 68, when routine screening begins.  Direct exam of the colon should be repeated every 5-10 years through age 44 years, unless  early forms of pre-cancerous polyps or small growths are found.  Hepatitis C blood testing is recommended for all people born from 1945 through 1965 and any individual with known risks for hepatitis C.  Pra  Osteoporosis is a disease in which the bones lose minerals and strength with aging. This can result in serious bone fractures or breaks. The risk of osteoporosis can be identified using a bone density scan. Women ages 65 years and over and women at risk for fractures or osteoporosis should discuss screening with their health care providers. Ask your health care provider whether you should take a calcium supplement or vitamin D to reduce the rate of osteoporosis.  Menopause can be associated with physical symptoms and risks. Hormone replacement therapy is available to decrease symptoms and risks. You should talk to your  health care provider about whether hormone replacement therapy is right for you.  Use sunscreen. Apply sunscreen liberally and repeatedly throughout the day. You should seek shade when your shadow is shorter than you. Protect yourself by wearing long sleeves, pants, a wide-brimmed hat, and sunglasses year round, whenever you are outdoors.  Once a month, do a whole body skin exam, using a mirror to look at the skin on your back. Tell your health care provider of new moles, moles that have irregular borders, moles that are larger than a pencil eraser, or moles that have changed in shape or color.  Stay current with required vaccines (immunizations).  Influenza vaccine. All adults should be immunized every year.  Tetanus, diphtheria, and acellular pertussis (Td, Tdap) vaccine. Pregnant women should receive 1 dose of Tdap vaccine during each pregnancy. The dose should be obtained regardless of the length of time since the last dose. Immunization is preferred during the 27th-36th week of gestation. An adult who has not previously received Tdap or who does not know her vaccine status should receive 1 dose of Tdap. This initial dose should be followed by tetanus and diphtheria toxoids (Td) booster doses every 10 years. Adults with an unknown or incomplete history of completing a 3-dose immunization series with Td-containing vaccines should begin or complete a primary immunization series including a Tdap dose. Adults should receive a Td booster every 10 years.  Varicella vaccine. An adult without evidence of immunity to varicella should receive 2 doses or a second dose if she has previously received 1 dose. Pregnant females who do not have evidence of immunity should receive the first dose after pregnancy. This first dose should be obtained before leaving the health care facility. The second dose should be obtained 4-8 weeks after the first dose.  Human papillomavirus (HPV) vaccine. Females aged 13-26 years  who have not received the vaccine previously should obtain the 3-dose series. The vaccine is not recommended for use in pregnant females. However, pregnancy testing is not needed before receiving a dose. If a female is found to be pregnant after receiving a dose, no treatment is needed. In that case, the remaining doses should be delayed until after the pregnancy. Immunization is recommended for any person with an immunocompromised condition through the age of 26 years if she did not get any or all doses earlier. During the 3-dose series, the second dose should be obtained 4-8 weeks after the first dose. The third dose should be obtained 24 weeks after the first dose and 16 weeks after the second dose.  Zoster vaccine. One dose is recommended for adults aged 60 years or older unless certain conditions are present.    Measles, mumps, and rubella (MMR) vaccine. Adults born before [birth year] generally are considered immune to measles and mumps. Adults born in [birth year] or later should have 1 or more doses of MMR vaccine unless there is a contraindication to the vaccine or there is laboratory evidence of immunity to each of the three diseases. A routine second dose of MMR vaccine should be obtained at least 28 days after the first dose for students attending postsecondary schools, health care workers, or international travelers. People who received inactivated measles vaccine or an unknown type of measles vaccine during 1963-1967 should receive 2 doses of MMR vaccine. People who received inactivated mumps vaccine or an unknown type of mumps vaccine before 1979 and are at high risk for mumps infection should consider immunization with 2 doses of MMR vaccine. For females of childbearing age, rubella immunity should be determined. If there is no evidence of immunity, females who are not pregnant should be vaccinated. If there is no evidence of immunity, females who are pregnant should delay immunization until after pregnancy.  Unvaccinated health care workers born before [birth year] who lack laboratory evidence of measles, mumps, or rubella immunity or laboratory confirmation of disease should consider measles and mumps immunization with 2 doses of MMR vaccine or rubella immunization with 1 dose of MMR vaccine.  Pneumococcal 13-valent conjugate (PCV13) vaccine. When indicated, a person who is uncertain of her immunization history and has no record of immunization should receive the PCV13 vaccine. An adult aged 42 years or older who has certain medical conditions and has not been previously immunized should receive 1 dose of PCV13 vaccine. This PCV13 should be followed with a dose of pneumococcal polysaccharide (PPSV23) vaccine. The PPSV23 vaccine dose should be obtained at least 1 or more year(s) after the dose of PCV13 vaccine. An adult aged 46 years or older who has certain medical conditions and previously received 1 or more doses of PPSV23 vaccine should receive 1 dose of PCV13. The PCV13 vaccine dose should be obtained 1 or more years after the last PPSV23 vaccine dose.    Pneumococcal polysaccharide (PPSV23) vaccine. When PCV13 is also indicated, PCV13 should be obtained first. All adults aged 58 years and older should be immunized. An adult younger than age 54 years who has certain medical conditions should be immunized. Any person who resides in a nursing home or long-term care facility should be immunized. An adult smoker should be immunized. People with an immunocompromised condition and certain other conditions should receive both PCV13 and PPSV23 vaccines. People with human immunodeficiency virus (HIV) infection should be immunized as soon as possible after diagnosis. Immunization during chemotherapy or radiation therapy should be avoided. Routine use of PPSV23 vaccine is not recommended for American Indians, [place] Natives, or people younger than 65 years unless there are medical conditions that require PPSV23 vaccine. When  indicated, people who have unknown immunization and have no record of immunization should receive PPSV23 vaccine. One-time revaccination 5 years after the first dose of PPSV23 is recommended for people aged 19-64 years who have chronic kidney failure, nephrotic syndrome, asplenia, or immunocompromised conditions. People who received 1-2 doses of PPSV23 before age 97 years should receive another dose of PPSV23 vaccine at age 79 years or later if at least 5 years have passed since the previous dose. Doses of PPSV23 are not needed for people immunized with PPSV23 at or after age 39 years.  Preventive Services / Frequency   Ages 68 to 35 years  Blood pressure check.  Lipid and cholesterol check.  Lung cancer screening. / Every year if you are aged 63-80 years and have a 30-pack-year history of smoking and currently smoke or have quit within the past 15 years. Yearly screening is stopped once you have quit smoking for at least 15 years or develop a health problem that would prevent you from having lung cancer treatment.  Clinical breast exam.** / Every year after age 50 years.   BRCA-related cancer risk assessment.** / For women who have family members with a BRCA-related cancer (breast, ovarian, tubal, or peritoneal cancers).  Mammogram.** / Every year beginning at age 37 years and continuing for as long as you are in good health. Consult with your health care provider.  Pap test.** / Every 3 years starting at age 65 years through age 67 or 22 years with a history of 3 consecutive normal Pap tests.  HPV screening.** / Every 3 years from ages 62 years through ages 99 to 25 years with a history of 3 consecutive normal Pap tests.  Fecal occult blood test (FOBT) of stool. / Every year beginning at age 41 years and continuing until age 39 years. You may not need to do this test if you get a colonoscopy every 10 years.  Flexible sigmoidoscopy or colonoscopy.** / Every 5 years for a flexible  sigmoidoscopy or every 10 years for a colonoscopy beginning at age 38 years and continuing until age 68 years.  Hepatitis C blood test.** / For all people born from [birth year] through 1965 and any individual with known risks for hepatitis C.  Skin self-exam. / Monthly.  Influenza vaccine. / Every year.  Tetanus, diphtheria, and acellular pertussis (Tdap/Td) vaccine.** / Consult your health care provider. Pregnant women should receive 1 dose of Tdap vaccine during each pregnancy. 1 dose of Td every 10 years.  Varicella vaccine.** / Consult your health care provider. Pregnant females who do not have evidence of immunity should receive the first dose after pregnancy.  Zoster vaccine.** / 1 dose for adults aged 60 years or older.  Pneumococcal 13-valent conjugate (PCV13) vaccine.** / Consult your health care provider.  Pneumococcal polysaccharide (PPSV23) vaccine.** / 1 to 2 doses if you smoke cigarettes or if you have certain conditions.  Meningococcal vaccine.** / Consult your health care provider.  Hepatitis A vaccine.** / Consult your health care provider.  Hepatitis B vaccine.** / Consult your health care provider. Screening for abdominal aortic aneurysm (AAA)  by ultrasound is recommended for people over 50 who have history of high blood pressure or who are current or former smokers. ++++++++++++++++++ Recommend Adult Low Dose Aspirin or  coated  Aspirin 81 mg daily  To reduce risk of Colon Cancer 20 %,  Skin Cancer 26 % ,  Melanoma 46%  and  Pancreatic cancer 60% +++++++++++++++++++ Vitamin D goal  is between 70-100.  Please make sure that you are taking your Vitamin D as directed.  It is very important as a natural anti-inflammatory  helping hair, skin, and nails, as well as reducing stroke and heart attack risk.  It helps your bones and helps with mood. It also decreases numerous cancer risks so please take it as directed.  Low Vit D is associated with a 200-300% higher risk  for CANCER  and 200-300% higher risk for HEART   ATTACK  &  STROKE.   .....................................Marland Kitchen It is also associated with higher death rate at younger ages,  autoimmune diseases like Rheumatoid arthritis, Lupus, Multiple Sclerosis.  Also many other serious conditions, like depression, Alzheimer's Dementia, infertility, muscle aches, fatigue, fibromyalgia - just to name a few. ++++++++++++++++++ Recommend the book "The END of DIETING" by Dr Joel Fuhrman  & the book "The END of DIABETES " by Dr Joel Fuhrman At [employer] - get book & Audio CD's    Being diabetic has a  300% increased risk for heart attack, stroke, cancer, and alzheimer- type vascular dementia. It is very important that you work harder with diet by avoiding all foods that are white. Avoid white rice (brown & wild rice is OK), white potatoes (sweetpotatoes in moderation is OK), White bread or wheat bread or anything made out of white flour like bagels, donuts, rolls, buns, biscuits, cakes, pastries, cookies, pizza crust, and pasta (made from white flour & egg whites) - vegetarian pasta or spinach or wheat pasta is OK. Multigrain breads like Arnold's or Pepper[employer], or multigrain sandwich thins or flatbreads.  Diet, exercise and weight loss can reverse and cure diabetes in the early stages.  Diet, exercise and weight loss is very important in the control and prevention of complications of diabetes which affects every system in your body, ie. Brain - dementia/stroke, eyes - glaucoma/blindness, heart - heart attack/heart failure, kidneys - dialysis, stomach - gastric paralysis, intestines - malabsorption, nerves - severe painful neuritis, circulation - gangrene & loss of a leg(s), and finally cancer and Alzheimers.    I recommend avoid fried & greasy foods,  sweets/candy, white rice (brown or wild rice or Quinoa is OK), white potatoes (sweet potatoes are OK) - anything made from white flour - bagels, doughnuts, rolls, buns,  biscuits,white and wheat breads, pizza crust and traditional pasta made of white flour & egg white(vegetarian pasta or spinach or wheat pasta is OK).  Multi-grain bread is OK - like multi-grain flat bread or sandwich thins. Avoid alcohol in excess. Exercise is also important.    Eat all the vegetables you want - avoid meat, especially red meat and dairy - especially cheese.  Cheese is the most concentrated form of trans-fats which is the worst thing to clog up our arteries. Veggie cheese is OK which can be found in the fresh produce section at [employer] or [employer] or [employer]  ++++++++++++++++++++++ DASH Eating Plan  DASH stands for "Dietary Approaches to Stop Hypertension."   The DASH eating plan is a healthy eating plan that has been shown to reduce high blood pressure (hypertension). Additional health benefits may include reducing the risk of type 2 diabetes mellitus, heart disease, and stroke. The DASH eating plan may also help with weight loss. WHAT DO I NEED TO KNOW ABOUT THE DASH EATING PLAN? For the DASH eating plan, you will follow these general guidelines:  Choose foods with a percent daily value for sodium of less than 5% (as listed on the food label).  Use salt-free seasonings or herbs instead of table salt or sea salt.  Check with your health care provider or pharmacist before using salt substitutes.  Eat lower-sodium products, often labeled as "lower sodium" or "no salt added."  Eat fresh foods.  Eat more vegetables, fruits, and low-fat dairy products.  Choose whole grains. Look for the word "whole" as the first word in the ingredient list.  Choose fish   Limit sweets, desserts, sugars, and sugary drinks.  Choose heart-healthy fats.  Eat veggie cheese   Eat more home-cooked food and less restaurant, buffet, and fast food.  Limit fried foods.  Cook foods using   methods other than frying.  Limit canned vegetables. If you do use them, rinse them well to  decrease the sodium.  When eating at a restaurant, ask that your food be prepared with less salt, or no salt if possible.                      WHAT FOODS CAN I EAT? Read Dr Fara Olden Fuhrman's books on The End of Dieting & The End of Diabetes  Grains Whole grain or whole wheat bread. Brown rice. Whole grain or whole wheat pasta. Quinoa, bulgur, and whole grain cereals. Low-sodium cereals. Corn or whole wheat flour tortillas. Whole grain cornbread. Whole grain crackers. Low-sodium crackers.  Vegetables Fresh or frozen vegetables (raw, steamed, roasted, or grilled). Low-sodium or reduced-sodium tomato and vegetable juices. Low-sodium or reduced-sodium tomato sauce and paste. Low-sodium or reduced-sodium canned vegetables.   Fruits All fresh, canned (in natural juice), or frozen fruits.  Protein Products  All fish and seafood.  Dried beans, peas, or lentils. Unsalted nuts and seeds. Unsalted canned beans.  Dairy Low-fat dairy products, such as skim or 1% milk, 2% or reduced-fat cheeses, low-fat ricotta or cottage cheese, or plain low-fat yogurt. Low-sodium or reduced-sodium cheeses.  Fats and Oils Tub margarines without trans fats. Light or reduced-fat mayonnaise and salad dressings (reduced sodium). Avocado. Safflower, olive, or canola oils. Natural peanut or almond butter.  Other Unsalted popcorn and pretzels. The items listed above may not be a complete list of recommended foods or beverages. Contact your dietitian for more options.  ++++++++++++++++++  WHAT FOODS ARE NOT RECOMMENDED? Grains/ White flour or wheat flour White bread. White pasta. White rice. Refined cornbread. Bagels and croissants. Crackers that contain trans fat.  Vegetables  Creamed or fried vegetables. Vegetables in a . Regular canned vegetables. Regular canned tomato sauce and paste. Regular tomato and vegetable juices.  Fruits Dried fruits. Canned fruit in light or heavy syrup. Fruit juice.  Meat and Other  Protein Products Meat in general - RED meat & White meat.  Fatty cuts of meat. Ribs, chicken wings, all processed meats as bacon, sausage, bologna, salami, fatback, hot dogs, bratwurst and packaged luncheon meats.  Dairy Whole or 2% milk, cream, half-and-half, and cream cheese. Whole-fat or sweetened yogurt. Full-fat cheeses or blue cheese. Non-dairy creamers and whipped toppings. Processed cheese, cheese spreads, or cheese curds.  Condiments Onion and garlic salt, seasoned salt, table salt, and sea salt. Canned and packaged gravies. Worcestershire sauce. Tartar sauce. Barbecue sauce. Teriyaki sauce. Soy sauce, including reduced sodium. Steak sauce. Fish sauce. Oyster sauce. Cocktail sauce. Horseradish. Ketchup and mustard. Meat flavorings and tenderizers. Bouillon cubes. Hot sauce. Tabasco sauce. Marinades. Taco seasonings. Relishes.  Fats and Oils Butter, stick margarine, lard, shortening and bacon fat. Coconut, palm kernel, or palm oils. Regular salad dressings.  Pickles and olives. Salted popcorn and pretzels.  The items listed above may not be a complete list of foods and beverages to avoid.   .preve

## 2018-07-08 ENCOUNTER — Other Ambulatory Visit: Payer: Self-pay | Admitting: Internal Medicine

## 2018-07-08 LAB — CBC WITH DIFFERENTIAL/PLATELET
Absolute Monocytes: 488 cells/uL (ref 200–950)
Basophils Absolute: 80 cells/uL (ref 0–200)
Basophils Relative: 1 %
Eosinophils Absolute: 176 cells/uL (ref 15–500)
Eosinophils Relative: 2.2 %
HCT: 39.8 % (ref 35.0–45.0)
Hemoglobin: 12.4 g/dL (ref 11.7–15.5)
LYMPHS ABS: 2640 {cells}/uL (ref 850–3900)
MCH: 25.1 pg — ABNORMAL LOW (ref 27.0–33.0)
MCHC: 31.2 g/dL — ABNORMAL LOW (ref 32.0–36.0)
MCV: 80.4 fL (ref 80.0–100.0)
MONOS PCT: 6.1 %
MPV: 11.4 fL (ref 7.5–12.5)
Neutro Abs: 4616 cells/uL (ref 1500–7800)
Neutrophils Relative %: 57.7 %
Platelets: 259 10*3/uL (ref 140–400)
RBC: 4.95 10*6/uL (ref 3.80–5.10)
RDW: 20.7 % — ABNORMAL HIGH (ref 11.0–15.0)
Total Lymphocyte: 33 %
WBC: 8 10*3/uL (ref 3.8–10.8)

## 2018-07-08 LAB — URINALYSIS, ROUTINE W REFLEX MICROSCOPIC
BILIRUBIN URINE: NEGATIVE
GLUCOSE, UA: NEGATIVE
HGB URINE DIPSTICK: NEGATIVE
KETONES UR: NEGATIVE
LEUKOCYTES UA: NEGATIVE
Nitrite: NEGATIVE
PH: 6 (ref 5.0–8.0)
PROTEIN: NEGATIVE
Specific Gravity, Urine: 1.019 (ref 1.001–1.03)

## 2018-07-08 LAB — COMPLETE METABOLIC PANEL WITH GFR
AG Ratio: 1.7 (calc) (ref 1.0–2.5)
ALT: 10 U/L (ref 6–29)
AST: 14 U/L (ref 10–35)
Albumin: 4.5 g/dL (ref 3.6–5.1)
Alkaline phosphatase (APISO): 68 U/L (ref 37–153)
BUN: 20 mg/dL (ref 7–25)
CHLORIDE: 103 mmol/L (ref 98–110)
CO2: 30 mmol/L (ref 20–32)
Calcium: 10 mg/dL (ref 8.6–10.4)
Creat: 0.71 mg/dL (ref 0.50–0.99)
GFR, Est African American: 106 mL/min/{1.73_m2} (ref >=60)
GFR, Est Non African American: 91 mL/min/{1.73_m2} (ref >=60)
GLUCOSE: 90 mg/dL (ref 65–99)
Globulin: 2.7 g/dL (calc) (ref 1.9–3.7)
Potassium: 4.5 mmol/L (ref 3.5–5.3)
Sodium: 142 mmol/L (ref 135–146)
Total Bilirubin: 0.2 mg/dL (ref 0.2–1.2)
Total Protein: 7.2 g/dL (ref 6.1–8.1)

## 2018-07-08 LAB — LIPID PANEL
Cholesterol: 288 mg/dL — ABNORMAL HIGH (ref <200)
HDL: 45 mg/dL — ABNORMAL LOW (ref >=50)
LDL Cholesterol (Calc): 189 mg/dL (calc) — ABNORMAL HIGH
Non-HDL Cholesterol (Calc): 243 mg/dL (calc) — ABNORMAL HIGH (ref <130)
Total CHOL/HDL Ratio: 6.4 (calc) — ABNORMAL HIGH (ref <5.0)
Triglycerides: 318 mg/dL — ABNORMAL HIGH (ref <150)

## 2018-07-08 LAB — MICROALBUMIN / CREATININE URINE RATIO
Creatinine, Urine: 86 mg/dL (ref 20–275)
MICROALB/CREAT RATIO: 3 ug/mg{creat} (ref <30)
Microalb, Ur: 0.3 mg/dL

## 2018-07-08 LAB — IRON, TOTAL/TOTAL IRON BINDING CAP
%SAT: 10 % (calc) — ABNORMAL LOW (ref 16–45)
Iron: 42 ug/dL — ABNORMAL LOW (ref 45–160)
TIBC: 430 mcg/dL (calc) (ref 250–450)

## 2018-07-08 LAB — HEMOGLOBIN A1C
EAG (MMOL/L): 6.2 (calc)
Hgb A1c MFr Bld: 5.5 % of total Hgb (ref <5.7)
Mean Plasma Glucose: 111 (calc)

## 2018-07-08 LAB — VITAMIN B12: VITAMIN B 12: 311 pg/mL (ref 200–1100)

## 2018-07-08 LAB — VITAMIN D 25 HYDROXY (VIT D DEFICIENCY, FRACTURES): Vit D, 25-Hydroxy: 41 ng/mL (ref 30–100)

## 2018-07-08 LAB — INSULIN, RANDOM: INSULIN: 13.5 u[IU]/mL (ref 2.0–19.6)

## 2018-07-08 LAB — TSH: TSH: 2.38 mIU/L (ref 0.40–4.50)

## 2018-07-08 LAB — MAGNESIUM: MAGNESIUM: 2 mg/dL (ref 1.5–2.5)

## 2018-10-13 NOTE — Progress Notes (Signed)
Virtual Visit via Telephone Note  I connected with Whitney Herrera on 10/15/18 at 10:15 AM EDT by telephone and verified that I am speaking with the correct person using two identifiers.  Location: Patient: Home Provider: [place] office  I discussed the limitations, risks, security and privacy concerns of performing an evaluation and management service by telephone and the availability of in person appointments. I also discussed with the patient that there may be a patient responsible charge related to this service. The patient expressed understanding and agreed to proceed.  I discussed the assessment and treatment plan with the patient. The patient was provided an opportunity to ask questions and all were answered. The patient agreed with the plan and demonstrated an understanding of the instructions.   The patient was advised to call back or seek an in-person evaluation if the symptoms worsen or if the condition fails to improve as anticipated.  I provided 30 minutes of non-face-to-face time during this encounter.   Izora Ribas, NP     FOLLOW UP  Assessment and Plan:   Hypertension Well controlled with current medications  Monitor blood pressure at home; patient to call if consistently greater than 130/80 Continue DASH diet.   Reminder to go to the ER if any CP, SOB, nausea, dizziness, severe HA, changes vision/speech, left arm numbness and tingling and jaw pain.  Cholesterol Currently above goal; has increased to rosuvastatin 20 mg daily and tolerating well - taper up to 40 mg daily if needed, then consider adding zetia 10 mg if needed  discussed goal LDL <100 Continue low cholesterol diet and exercise.  Check lipid panel.   Other abnormal glucose (hx of prediabete Recent A1Cs at goal Discussed diet/exercise, weight management  Defer A1C; check CMP  Morbid Obesity with co morbidities Long discussion about weight loss, diet, and exercise Recommended diet heavy in  fruits and veggies and low in animal meats, cheeses, and dairy products, appropriate calorie intake Discussed ideal weight for height and initial weight goal (225lb) Patient will work on focusing on fruit and veggie intake (stretegies discussed) and lean protein intake, cut back on sweets, start exercise aiming to work up to 150 min/week PUSH water intake Patient on phentermine with benefit and no SE, taking drug breaks; continue close follow up. Will follow up in 3 months  Vitamin D Def Below goal at last visit; she has not changed her dose Recommended she alternate between taking 5000 IU and 10000 IU every other day - currently taking 5000 IU daily  continue supplementation to maintain goal of 60-100 Defer Vit D level  Anxiety Well managed by current regimen; continue medications - may taper off of after holidays this year - doing well since retirement Stress management techniques discussed, increase water, good sleep hygiene discussed, increase exercise, and increase veggies.   Fatigue/malaise Reports symptoms similar to last year when found to be anemic Will schedule lab visit on Monday and check CBC, CMET, TSH She continues with iron supplement Newly on b12 supplement  Scheduling lab visit for next Monday 10/18/2018  Continue diet and meds as discussed. Further disposition pending results of labs. Discussed med's effects and SE's.   Over 30 minutes of exam, counseling, chart review, and critical decision making was performed.   Future Appointments  Date Time Provider [place]  01/19/2019 10:30 AM Unk Pinto, MD [facility] None  07/28/2019  2:00 PM Unk Pinto, MD [facility] None    ----------------------------------------------------------------------------------------------------------------------  HPI 63 y.o. female  presents for  3 month follow up on hypertension, cholesterol, prediabetes, morbid obesity and vitamin D deficiency.   She is newly on  fosamax weekly for osteoporosis since 06/2018 via GYN.   She endorses feeling "blah" and feeling somewhat short of breath when leaning over x 2-3 weeks, primarily in the AM; she had similar symptoms last year and was found to be anemic (hgb 8.6 on 04/20/2018). Had negatvie EGD and colonosocpy with Dr. Earlean Shawl last year in November, and blood counts and symptoms resolved with iron supplement which she has continued. She denies exertional dyspnea, dizziness, difficulty focusing, chest pains, blood in stool, dark stools.   Lab Results  Component Value Date   WBC 8.0 07/07/2018   HGB 12.4 07/07/2018   HCT 39.8 07/07/2018   MCV 80.4 07/07/2018   PLT 259 07/07/2018   Lab Results  Component Value Date   IRON 42 (L) 07/07/2018   TIBC 430 07/07/2018   FERRITIN 4 (L) 04/19/2018   Lab Results  Component Value Date   TSH 2.38 07/07/2018   She is newly on B12 2500 mcg daily since last visit:  Lab Results  Component Value Date   [id number] 07/07/2018     She is currently taking lexapro 20 mg daily that was started for work related stress/anxiety several years ago, doing much better since retiring in July 2019. Thinking about tapering off next year if doing well after the holidays.   she is prescribed phentermine for weight loss, taking 1 tab daily in the AM since last visit, will skip on the weekends sometimes. While on the medication they have lost 3 lbs since last visit. They deny palpitations, anxiety, trouble sleeping, elevated BP.   BMI is Body mass index is 42.89 kg/m., she is working on diet and exercise, she eats light break breakfast and lunch, not snacking. She admits to eating too much sweets while stuck at home, and not eating as well as she'd like due to avoiding the store and not eating as much fresh fruits and vegetables.  Wt Readings from Last 3 Encounters:  10/15/18 234 lb 8 oz (106.4 kg)  07/07/18 237 lb (107.5 kg)  04/20/18 238 lb (108 kg)   Exercise: walking with dog -   Water intake: admits not as good as she should be - can't quantify  Her blood pressure has been controlled at home, today their BP is BP: 113/71  She does not workout. She denies chest pain, shortness of breath, dizziness.   She is on cholesterol medication Rosuvastatin 20 mg every day (increased from every other day since last visit) and denies myalgias. Her cholesterol is not at goal. The cholesterol last visit was:   Lab Results  Component Value Date   CHOL 288 (H) 07/07/2018   HDL 45 (L) 07/07/2018   LDLCALC 189 (H) 07/07/2018   TRIG 318 (H) 07/07/2018   CHOLHDL 6.4 (H) 07/07/2018    She has been working on diet prediabetes, and denies increased appetite, nausea, paresthesia of the feet, polydipsia, polyuria and visual disturbances. Last A1C in the office was:  Lab Results  Component Value Date   HGBA1C 5.5 07/07/2018   Patient is on Vitamin D supplement, taking 5000 IU daily  Lab Results  Component Value Date   VD25OH 9 07/07/2018       Current Medications:  Current Outpatient Medications on File Prior to Visit  Medication Sig  . alendronate (FOSAMAX) 70 MG tablet Take 70 mg by mouth once a week. Take with  a full glass of water on an empty stomach.  Marland Kitchen atenolol (TENORMIN) 100 MG tablet Take 1 tablet (100 mg total) by mouth daily.  . Cholecalciferol (VITAMIN D PO) Take 5,000 Units by mouth daily.   . Cyanocobalamin (VITAMIN B-12) 2500 MCG TABS Take 100 mcg by mouth daily.  Marland Kitchen escitalopram (LEXAPRO) 20 MG tablet Take 1 tablet (20 mg total) by mouth daily.  . montelukast (SINGULAIR) 10 MG tablet Take 1 tablet daily for Allergies  . phentermine (ADIPEX-P) 37.5 MG tablet Take 1/2 to 1 tablet every morning for Dieting & Weigh Loss  . rosuvastatin (CRESTOR) 20 MG tablet TAKE 1 TABLET BY MOUTH DAILY FOR CHOLESTEROL.   No current facility-administered medications on file prior to visit.      Allergies:  Allergies  Allergen Reactions  . Lipitor [Atorvastatin]   . Wellbutrin  [Bupropion]     dysphoria     Medical History:  Past Medical History:  Diagnosis Date  . B12 deficiency   . Hyperlipidemia   . Hypertension   . Obesity   . Prediabetes   . Vitamin D deficiency    Family history- Reviewed and unchanged Social history- Reviewed and unchanged   Review of Systems:  Review of Systems  Constitutional: Positive for malaise/fatigue. Negative for chills, fever and weight loss.  HENT: Negative for hearing loss, sore throat and tinnitus.   Eyes: Negative for blurred vision and double vision.  Respiratory: Positive for shortness of breath (when bending over in the AM). Negative for cough and wheezing.   Cardiovascular: Negative for chest pain, palpitations, orthopnea, claudication and leg swelling.  Gastrointestinal: Negative for abdominal pain, blood in stool, constipation, diarrhea, heartburn, melena, nausea and vomiting.  Genitourinary: Negative.  Negative for hematuria.  Musculoskeletal: Negative for falls, joint pain and myalgias.  Skin: Negative for rash.  Neurological: Negative for dizziness, tingling, sensory change, weakness and headaches.  Endo/Heme/Allergies: Negative for polydipsia.  Psychiatric/Behavioral: Negative.  Negative for depression and substance abuse. The patient is not nervous/anxious and does not have insomnia.   All other systems reviewed and are negative.    Physical Exam: BP 113/71   Pulse 67   Temp 97.7 F (36.5 C)   Wt 234 lb 8 oz (106.4 kg)   BMI 42.89 kg/m  Wt Readings from Last 3 Encounters:  10/15/18 234 lb 8 oz (106.4 kg)  07/07/18 237 lb (107.5 kg)  04/20/18 238 lb (108 kg)   General : Well sounding patient in no apparent distress HEENT: no hoarseness, no cough for duration of visit Lungs: speaks in complete sentences, no audible wheezing, no apparent distress Neurological: alert, oriented x 3 Psychiatric: pleasant, judgement appropriate     Izora Ribas, NP 10:28 AM [facility]

## 2018-10-14 ENCOUNTER — Other Ambulatory Visit: Payer: Self-pay

## 2018-10-14 MED ORDER — CYANOCOBALAMIN 2500 MCG PO TABS: 100.0000 ug | ORAL_TABLET | Freq: Every day | ORAL | 3 refills | Status: AC

## 2018-10-15 ENCOUNTER — Encounter: Payer: Self-pay | Admitting: Adult Health

## 2018-10-15 ENCOUNTER — Other Ambulatory Visit: Payer: Self-pay

## 2018-10-15 ENCOUNTER — Other Ambulatory Visit: Payer: Self-pay | Admitting: Adult Health

## 2018-10-15 ENCOUNTER — Ambulatory Visit: Payer: BC Managed Care – PPO | Admitting: Adult Health

## 2018-10-15 ENCOUNTER — Ambulatory Visit: Payer: Self-pay | Admitting: Adult Health

## 2018-10-15 VITALS — BP 113/71 | HR 67 | Temp 97.7°F | Wt 234.5 lb

## 2018-10-15 DIAGNOSIS — I10 Essential (primary) hypertension: Secondary | ICD-10-CM | POA: Diagnosis not present

## 2018-10-15 DIAGNOSIS — M810 Age-related osteoporosis without current pathological fracture: Secondary | ICD-10-CM

## 2018-10-15 DIAGNOSIS — F419 Anxiety disorder, unspecified: Secondary | ICD-10-CM

## 2018-10-15 DIAGNOSIS — D509 Iron deficiency anemia, unspecified: Secondary | ICD-10-CM

## 2018-10-15 DIAGNOSIS — Z79899 Other long term (current) drug therapy: Secondary | ICD-10-CM

## 2018-10-15 DIAGNOSIS — R7309 Other abnormal glucose: Secondary | ICD-10-CM | POA: Diagnosis not present

## 2018-10-15 DIAGNOSIS — E559 Vitamin D deficiency, unspecified: Secondary | ICD-10-CM

## 2018-10-15 DIAGNOSIS — M3500 Sicca syndrome, unspecified: Secondary | ICD-10-CM | POA: Diagnosis not present

## 2018-10-15 DIAGNOSIS — E782 Mixed hyperlipidemia: Secondary | ICD-10-CM

## 2018-10-15 DIAGNOSIS — R5383 Other fatigue: Secondary | ICD-10-CM

## 2018-10-15 DIAGNOSIS — R5381 Other malaise: Secondary | ICD-10-CM

## 2018-10-18 ENCOUNTER — Other Ambulatory Visit: Payer: Self-pay

## 2018-10-18 ENCOUNTER — Other Ambulatory Visit: Payer: BC Managed Care – PPO

## 2018-10-18 DIAGNOSIS — I10 Essential (primary) hypertension: Secondary | ICD-10-CM | POA: Diagnosis not present

## 2018-10-18 DIAGNOSIS — E782 Mixed hyperlipidemia: Secondary | ICD-10-CM

## 2018-10-18 DIAGNOSIS — Z79899 Other long term (current) drug therapy: Secondary | ICD-10-CM | POA: Diagnosis not present

## 2018-10-18 DIAGNOSIS — D509 Iron deficiency anemia, unspecified: Secondary | ICD-10-CM

## 2018-10-19 LAB — COMPLETE METABOLIC PANEL WITH GFR
AG Ratio: 1.6 (calc) (ref 1.0–2.5)
ALT: 9 U/L (ref 6–29)
AST: 12 U/L (ref 10–35)
Albumin: 4.1 g/dL (ref 3.6–5.1)
Alkaline phosphatase (APISO): 71 U/L (ref 37–153)
BUN: 17 mg/dL (ref 7–25)
CO2: 31 mmol/L (ref 20–32)
Calcium: 9.6 mg/dL (ref 8.6–10.4)
Chloride: 104 mmol/L (ref 98–110)
Creat: 0.75 mg/dL (ref 0.50–0.99)
GFR, Est African American: 99 mL/min/{1.73_m2} (ref >=60)
GFR, Est Non African American: 85 mL/min/{1.73_m2} (ref >=60)
Globulin: 2.6 g/dL (calc) (ref 1.9–3.7)
Glucose, Bld: 103 mg/dL — ABNORMAL HIGH (ref 65–99)
Potassium: 4.4 mmol/L (ref 3.5–5.3)
Sodium: 142 mmol/L (ref 135–146)
Total Bilirubin: 0.4 mg/dL (ref 0.2–1.2)
Total Protein: 6.7 g/dL (ref 6.1–8.1)

## 2018-10-19 LAB — CBC WITH DIFFERENTIAL/PLATELET
Absolute Monocytes: 448 cells/uL (ref 200–950)
Basophils Absolute: 48 cells/uL (ref 0–200)
Basophils Relative: 0.6 %
Eosinophils Absolute: 160 cells/uL (ref 15–500)
Eosinophils Relative: 2 %
HCT: 41.6 % (ref 35.0–45.0)
Hemoglobin: 13.5 g/dL (ref 11.7–15.5)
Lymphs Abs: 2216 cells/uL (ref 850–3900)
MCH: 27.8 pg (ref 27.0–33.0)
MCHC: 32.5 g/dL (ref 32.0–36.0)
MCV: 85.6 fL (ref 80.0–100.0)
MPV: 11.4 fL (ref 7.5–12.5)
Monocytes Relative: 5.6 %
Neutro Abs: 5128 cells/uL (ref 1500–7800)
Neutrophils Relative %: 64.1 %
Platelets: 219 10*3/uL (ref 140–400)
RBC: 4.86 10*6/uL (ref 3.80–5.10)
RDW: 15 % (ref 11.0–15.0)
Total Lymphocyte: 27.7 %
WBC: 8 10*3/uL (ref 3.8–10.8)

## 2018-10-19 LAB — LIPID PANEL
Cholesterol: 169 mg/dL (ref <200)
HDL: 40 mg/dL — ABNORMAL LOW (ref >=50)
LDL Cholesterol (Calc): 101 mg/dL (calc) — ABNORMAL HIGH
Non-HDL Cholesterol (Calc): 129 mg/dL (calc) (ref <130)
Total CHOL/HDL Ratio: 4.2 (calc) (ref <5.0)
Triglycerides: 188 mg/dL — ABNORMAL HIGH (ref <150)

## 2018-10-19 LAB — TSH: TSH: 2.46 mIU/L (ref 0.40–4.50)

## 2018-10-19 LAB — MAGNESIUM: Magnesium: 1.9 mg/dL (ref 1.5–2.5)

## 2019-01-19 ENCOUNTER — Encounter: Payer: Self-pay | Admitting: Internal Medicine

## 2019-01-19 ENCOUNTER — Other Ambulatory Visit: Payer: Self-pay

## 2019-01-19 ENCOUNTER — Ambulatory Visit: Payer: BC Managed Care – PPO | Admitting: Internal Medicine

## 2019-01-19 VITALS — BP 118/74 | HR 72 | Temp 97.6°F | Resp 16 | Ht 62.0 in | Wt 237.6 lb

## 2019-01-19 DIAGNOSIS — Z79899 Other long term (current) drug therapy: Secondary | ICD-10-CM | POA: Diagnosis not present

## 2019-01-19 DIAGNOSIS — I10 Essential (primary) hypertension: Secondary | ICD-10-CM | POA: Diagnosis not present

## 2019-01-19 DIAGNOSIS — E782 Mixed hyperlipidemia: Secondary | ICD-10-CM | POA: Diagnosis not present

## 2019-01-19 DIAGNOSIS — E559 Vitamin D deficiency, unspecified: Secondary | ICD-10-CM | POA: Diagnosis not present

## 2019-01-19 DIAGNOSIS — R7309 Other abnormal glucose: Secondary | ICD-10-CM

## 2019-01-19 DIAGNOSIS — R7303 Prediabetes: Secondary | ICD-10-CM

## 2019-01-19 MED ORDER — TOPIRAMATE 50 MG PO TABS: ORAL_TABLET | ORAL | 1 refills | Status: DC

## 2019-01-19 NOTE — Patient Instructions (Signed)

## 2019-01-19 NOTE — Progress Notes (Signed)
History of Present Illness:      This very nice 63 y.o.female presents for 6 month follow up with HTN, HLD, Pre-Diabetes and Vitamin D Deficiency.       Patient is treated for HTN (2005) & BP has been controlled at home. Today's BP is at goal -  118/74. Patient has had no complaints of any cardiac type chest pain, palpitations, dyspnea / orthopnea / PND, dizziness, claudication, or dependent edema.      Hyperlipidemia is controlled with diet & meds. Patient denies myalgias or other med SE's. Last Lipids were almost to goal with LDL (101) and an elevated Trig(188): Lab Results  Component Value Date   CHOL 169 10/18/2018   HDL 40 (L) 10/18/2018   LDLCALC 101 (H) 10/18/2018   TRIG 188 (H) 10/18/2018   CHOLHDL 4.2 10/18/2018       Also, the patient has history of PreDiabetes (A1c 5.9% / 2011 & 5.8% / 2016)  and has had no symptoms of reactive hypoglycemia, diabetic polys, paresthesias or visual blurring.  Last A1c was Normal & at goal: Lab Results  Component Value Date   HGBA1C 5.5 07/07/2018   Wt Readings from Last 3 Encounters:  01/19/19 237 lb 9.6 oz (107.8 kg)  10/15/18 234 lb 8 oz (106.4 kg)  07/07/18 237 lb (107.5 kg)       Further, the patient also has history of Vitamin D Deficiency and supplements vitamin D without any suspected side-effects. Last vitamin D was Lab Results  Component Value Date   VD25OH 41 07/07/2018   Current Outpatient Medications on File Prior to Visit  Medication Sig  . alendronate (FOSAMAX) 70 MG tablet Take 70 mg by mouth once a week. Take with a full glass of water on an empty stomach.  Marland Kitchen atenolol (TENORMIN) 100 MG tablet Take 1 tablet (100 mg total) by mouth daily.  . Cholecalciferol (VITAMIN D PO) Take 5,000 Units by mouth daily.   . Cyanocobalamin (VITAMIN B-12) 2500 MCG TABS Take 100 mcg by mouth daily.  Marland Kitchen escitalopram (LEXAPRO) 20 MG tablet Take 1 tablet (20 mg total) by mouth daily.  . montelukast (SINGULAIR) 10 MG tablet Take 1  tablet daily for Allergies  . rosuvastatin (CRESTOR) 20 MG tablet TAKE 1 TABLET BY MOUTH DAILY FOR CHOLESTEROL.  Marland Kitchen phentermine (ADIPEX-P) 37.5 MG tablet Take 1/2 to 1 tablet every morning for Dieting & Weigh Loss   No current facility-administered medications on file prior to visit.    Allergies  Allergen Reactions  . Lipitor [Atorvastatin]   . Wellbutrin [Bupropion]     dysphoria   PMHx:   Past Medical History:  Diagnosis Date  . B12 deficiency   . Hyperlipidemia   . Hypertension   . Obesity   . Prediabetes   . Vitamin D deficiency    Immunization History  Administered Date(s) Administered  . Influenza-Unspecified 04/28/2015, 02/26/2016  . PPD Test 01/31/2014, 02/12/2015, 03/10/2016, 06/18/2017  . Pneumococcal-Unspecified 06/17/2008  . Td 04/18/2007  . Tdap 06/18/2017   Past Surgical History:  Procedure Laterality Date  . APPENDECTOMY    . BREAST BIOPSY Right 1975  . INGUINAL HERNIA REPAIR Bilateral age 63   FHx:    Reviewed / unchanged  SHx:    Reviewed / unchanged   Systems Review:  Constitutional: Denies fever, chills, wt changes, headaches, insomnia, fatigue, night sweats, change in appetite. Eyes: Denies redness, blurred vision, diplopia, discharge, itchy, watery eyes.  ENT: Denies discharge, congestion,  post nasal drip, epistaxis, sore throat, earache, hearing loss, dental pain, tinnitus, vertigo, sinus pain, snoring.  CV: Denies chest pain, palpitations, irregular heartbeat, syncope, dyspnea, diaphoresis, orthopnea, PND, claudication or edema. Respiratory: denies cough, dyspnea, DOE, pleurisy, hoarseness, laryngitis, wheezing.  Gastrointestinal: Denies dysphagia, odynophagia, heartburn, reflux, water brash, abdominal pain or cramps, nausea, vomiting, bloating, diarrhea, constipation, hematemesis, melena, hematochezia  or hemorrhoids. Genitourinary: Denies dysuria, frequency, urgency, nocturia, hesitancy, discharge, hematuria or flank pain. Musculoskeletal:  Denies arthralgias, myalgias, stiffness, jt. swelling, pain, limping or strain/sprain.  Skin: Denies pruritus, rash, hives, warts, acne, eczema or change in skin lesion(s). Neuro: No weakness, tremor, incoordination, spasms, paresthesia or pain. Psychiatric: Denies confusion, memory loss or sensory loss. Endo: Denies change in weight, skin or hair change.  Heme/Lymph: No excessive bleeding, bruising or enlarged lymph nodes.  Physical Exam  BP 118/74   Pulse 72   Temp 97.6 F (36.4 C)   Resp 16   Ht 5\' 2"  (1.575 m)   Wt 237 lb 9.6 oz (107.8 kg)   BMI 43.46 kg/m   Appears  well nourished, well groomed  and in no distress.  Eyes: PERRLA, EOMs, conjunctiva no swelling or erythema. Sinuses: No frontal/maxillary tenderness ENT/Mouth: EAC's clear, TM's nl w/o erythema, bulging. Nares clear w/o erythema, swelling, exudates. Oropharynx clear without erythema or exudates. Oral hygiene is good. Tongue normal, non obstructing. Hearing intact.  Neck: Supple. Thyroid not palpable. Car 2+/2+ without bruits, nodes or JVD. Chest: Respirations nl with BS clear & equal w/o rales, rhonchi, wheezing or stridor.  Cor: Heart sounds normal w/ regular rate and rhythm without sig. murmurs, gallops, clicks or rubs. Peripheral pulses normal and equal  without edema.  Abdomen: Soft & bowel sounds normal. Non-tender w/o guarding, rebound, hernias, masses or organomegaly.  Lymphatics: Unremarkable.  Musculoskeletal: Full ROM all peripheral extremities, joint stability, 5/5 strength and normal gait.  Skin: Warm, dry without exposed rashes, lesions or ecchymosis apparent.  Neuro: Cranial nerves intact, reflexes equal bilaterally. Sensory-motor testing grossly intact. Tendon reflexes grossly intact.  Pysch: Alert & oriented x 3.  Insight and judgement nl & appropriate. No ideations.  Assessment and Plan:  1. Essential hypertension  - Continue medication, monitor blood pressure at home.  - Continue DASH diet.   Reminder to go to the ER if any CP,  SOB, nausea, dizziness, severe HA, changes vision/speech.  - CBC with Differential/Platelet - COMPLETE METABOLIC PANEL WITH GFR - Magnesium - TSH  2. Hyperlipidemia, mixed  - Continue diet/meds, exercise,& lifestyle modifications.  - Continue monitor periodic cholesterol/liver & renal functions   - Lipid panel - TSH  3. Abnormal glucose  - Continue diet, exercise  - Lifestyle modifications.  - Monitor appropriate labs.  - Hemoglobin A1c - Insulin, random  4. Vitamin D deficiency  - Continue supplementation.  - VITAMIN D 25 Hydroxyl  5. Prediabetes  - Hemoglobin A1c - Insulin, random  6. Medication management  - CBC with Differential/Platelet - COMPLETE METABOLIC PANEL WITH GFR - Magnesium - Lipid panel - TSH - Hemoglobin A1c - Insulin, random - VITAMIN D 25 Hydroxyl  7. Morbid obesity (BMI 41.66)   - topiramate (TOPAMAX) 50 MG tablet; Take 1/2 to 1 tablet 2 x /day at Suppertime & Bedtime for Dieting & Weight Loss  Dispense: 180 tablet; Refill: 1       Discussed  regular exercise, BP monitoring, weight control to achieve/maintain BMI less than 25 and discussed med and SE's. Recommended labs to assess and monitor clinical status  with further disposition pending results of labs.  I discussed the assessment and treatment plan with the patient. The patient was provided an opportunity to ask questions and all were answered. The patient agreed with the plan and demonstrated an understanding of the instructions.  I provided over 30 minutes of exam, counseling, chart review and  complex critical decision making.  Kirtland Bouchard, MD

## 2019-01-20 LAB — COMPLETE METABOLIC PANEL WITH GFR
AG Ratio: 1.6 (calc) (ref 1.0–2.5)
ALT: 10 U/L (ref 6–29)
AST: 13 U/L (ref 10–35)
Albumin: 4 g/dL (ref 3.6–5.1)
Alkaline phosphatase (APISO): 70 U/L (ref 37–153)
BUN: 18 mg/dL (ref 7–25)
CO2: 27 mmol/L (ref 20–32)
Calcium: 9.4 mg/dL (ref 8.6–10.4)
Chloride: 104 mmol/L (ref 98–110)
Creat: 0.74 mg/dL (ref 0.50–0.99)
GFR, Est African American: 101 mL/min/{1.73_m2} (ref >=60)
GFR, Est Non African American: 87 mL/min/{1.73_m2} (ref >=60)
Globulin: 2.5 g/dL (calc) (ref 1.9–3.7)
Glucose, Bld: 91 mg/dL (ref 65–99)
Potassium: 4.5 mmol/L (ref 3.5–5.3)
Sodium: 141 mmol/L (ref 135–146)
Total Bilirubin: 0.3 mg/dL (ref 0.2–1.2)
Total Protein: 6.5 g/dL (ref 6.1–8.1)

## 2019-01-20 LAB — HEMOGLOBIN A1C
Hgb A1c MFr Bld: 5.9 % of total Hgb — ABNORMAL HIGH (ref <5.7)
Mean Plasma Glucose: 123 (calc)
eAG (mmol/L): 6.8 (calc)

## 2019-01-20 LAB — CBC WITH DIFFERENTIAL/PLATELET
Absolute Monocytes: 509 cells/uL (ref 200–950)
Basophils Absolute: 84 cells/uL (ref 0–200)
Basophils Relative: 1.1 %
Eosinophils Absolute: 213 cells/uL (ref 15–500)
Eosinophils Relative: 2.8 %
HCT: 36.7 % (ref 35.0–45.0)
Hemoglobin: 11.7 g/dL (ref 11.7–15.5)
Lymphs Abs: 2219 cells/uL (ref 850–3900)
MCH: 27.6 pg (ref 27.0–33.0)
MCHC: 31.9 g/dL — ABNORMAL LOW (ref 32.0–36.0)
MCV: 86.6 fL (ref 80.0–100.0)
MPV: 11 fL (ref 7.5–12.5)
Monocytes Relative: 6.7 %
Neutro Abs: 4575 cells/uL (ref 1500–7800)
Neutrophils Relative %: 60.2 %
Platelets: 234 10*3/uL (ref 140–400)
RBC: 4.24 10*6/uL (ref 3.80–5.10)
RDW: 12.9 % (ref 11.0–15.0)
Total Lymphocyte: 29.2 %
WBC: 7.6 10*3/uL (ref 3.8–10.8)

## 2019-01-20 LAB — VITAMIN D 25 HYDROXY (VIT D DEFICIENCY, FRACTURES): Vit D, 25-Hydroxy: 41 ng/mL (ref 30–100)

## 2019-01-20 LAB — TSH: TSH: 2.42 mIU/L (ref 0.40–4.50)

## 2019-01-20 LAB — MAGNESIUM: Magnesium: 2.1 mg/dL (ref 1.5–2.5)

## 2019-01-20 LAB — INSULIN, RANDOM: Insulin: 13.7 u[IU]/mL

## 2019-01-20 LAB — LIPID PANEL
Cholesterol: 178 mg/dL (ref <200)
HDL: 40 mg/dL — ABNORMAL LOW (ref >=50)
LDL Cholesterol (Calc): 109 mg/dL (calc) — ABNORMAL HIGH
Non-HDL Cholesterol (Calc): 138 mg/dL (calc) — ABNORMAL HIGH (ref <130)
Total CHOL/HDL Ratio: 4.5 (calc) (ref <5.0)
Triglycerides: 173 mg/dL — ABNORMAL HIGH (ref <150)

## 2019-01-21 ENCOUNTER — Other Ambulatory Visit: Payer: Self-pay | Admitting: Internal Medicine

## 2019-01-22 ENCOUNTER — Encounter: Payer: Self-pay | Admitting: Internal Medicine

## 2019-02-14 ENCOUNTER — Other Ambulatory Visit: Payer: Self-pay | Admitting: Internal Medicine

## 2019-02-14 ENCOUNTER — Other Ambulatory Visit: Payer: Self-pay | Admitting: Physician Assistant

## 2019-02-14 DIAGNOSIS — R7303 Prediabetes: Secondary | ICD-10-CM

## 2019-04-21 ENCOUNTER — Ambulatory Visit: Payer: BC Managed Care – PPO | Admitting: Adult Health Nurse Practitioner

## 2019-06-20 ENCOUNTER — Other Ambulatory Visit: Payer: Self-pay | Admitting: Adult Health

## 2019-07-28 ENCOUNTER — Ambulatory Visit (INDEPENDENT_AMBULATORY_CARE_PROVIDER_SITE_OTHER): Payer: BC Managed Care – PPO | Admitting: Internal Medicine

## 2019-07-28 ENCOUNTER — Other Ambulatory Visit: Payer: Self-pay

## 2019-07-28 ENCOUNTER — Encounter: Payer: Self-pay | Admitting: Internal Medicine

## 2019-07-28 VITALS — BP 126/74 | HR 68 | Temp 97.0°F | Resp 16 | Ht 62.0 in | Wt 240.6 lb

## 2019-07-28 DIAGNOSIS — I10 Essential (primary) hypertension: Secondary | ICD-10-CM

## 2019-07-28 DIAGNOSIS — E559 Vitamin D deficiency, unspecified: Secondary | ICD-10-CM

## 2019-07-28 DIAGNOSIS — Z8249 Family history of ischemic heart disease and other diseases of the circulatory system: Secondary | ICD-10-CM | POA: Diagnosis not present

## 2019-07-28 DIAGNOSIS — R7303 Prediabetes: Secondary | ICD-10-CM

## 2019-07-28 DIAGNOSIS — Z1329 Encounter for screening for other suspected endocrine disorder: Secondary | ICD-10-CM

## 2019-07-28 DIAGNOSIS — Z79899 Other long term (current) drug therapy: Secondary | ICD-10-CM | POA: Diagnosis not present

## 2019-07-28 DIAGNOSIS — Z13220 Encounter for screening for lipoid disorders: Secondary | ICD-10-CM

## 2019-07-28 DIAGNOSIS — Z131 Encounter for screening for diabetes mellitus: Secondary | ICD-10-CM

## 2019-07-28 DIAGNOSIS — Z0000 Encounter for general adult medical examination without abnormal findings: Secondary | ICD-10-CM

## 2019-07-28 DIAGNOSIS — Z1389 Encounter for screening for other disorder: Secondary | ICD-10-CM | POA: Diagnosis not present

## 2019-07-28 DIAGNOSIS — Z130 Encounter for screening for diseases of the blood and blood-forming organs and certain disorders involving the immune mechanism: Secondary | ICD-10-CM | POA: Diagnosis not present

## 2019-07-28 DIAGNOSIS — Z111 Encounter for screening for respiratory tuberculosis: Secondary | ICD-10-CM

## 2019-07-28 DIAGNOSIS — R7309 Other abnormal glucose: Secondary | ICD-10-CM

## 2019-07-28 DIAGNOSIS — Z136 Encounter for screening for cardiovascular disorders: Secondary | ICD-10-CM

## 2019-07-28 DIAGNOSIS — R5383 Other fatigue: Secondary | ICD-10-CM

## 2019-07-28 DIAGNOSIS — Z1211 Encounter for screening for malignant neoplasm of colon: Secondary | ICD-10-CM

## 2019-07-28 DIAGNOSIS — Z6841 Body Mass Index (BMI) 40.0 and over, adult: Secondary | ICD-10-CM

## 2019-07-28 DIAGNOSIS — Z0001 Encounter for general adult medical examination with abnormal findings: Secondary | ICD-10-CM

## 2019-07-28 DIAGNOSIS — E782 Mixed hyperlipidemia: Secondary | ICD-10-CM

## 2019-07-28 MED ORDER — PHENTERMINE HCL 37.5 MG PO TABS: ORAL_TABLET | ORAL | 1 refills | Status: DC

## 2019-07-28 NOTE — Patient Instructions (Signed)

## 2019-07-28 NOTE — Progress Notes (Signed)
Annual Screening/Preventative Visit & Comprehensive Evaluation &  Examination     This very nice 64 y.o. MWF  presents for a Screening /Preventative Visit & comprehensive evaluation and management of multiple medical co-morbidities.  Patient has been followed for HTN, HLD, Prediabetes  and Vitamin D Deficiency.      HTN predates since 2005. Patient's BP has been controlled at home and patient denies any cardiac symptoms as chest pain, palpitations, shortness of breath, dizziness or ankle swelling. Today's BP is at goal - 126/74.      Patient's hyperlipidemia is controlled with diet and medications. Patient denies myalgias or other medication SE's. Last lipids were not at goal:  Lab Results  Component Value Date   CHOL 178 01/19/2019   HDL 40 (L) 01/19/2019   LDLCALC 109 (H) 01/19/2019   TRIG 173 (H) 01/19/2019   CHOLHDL 4.5 01/19/2019       Patient has hx/o prediabetes (A1c 5.9% / 2011 and 5.8% / 2016) and patient denies reactive hypoglycemic symptoms, visual blurring, diabetic polys or paresthesias. Last A1c was not at goal:  Lab Results  Component Value Date   HGBA1C 5.9 (H) 01/19/2019   Wt Readings from Last 3 Encounters:  07/28/19 240 lb 9.6 oz (109.1 kg)  01/19/19 237 lb 9.6 oz (107.8 kg)  10/15/18 234 lb 8 oz (106.4 kg)      Finally, patient has history of Vitamin D Deficiency ("20" / 2008 & "34" / 2013)  and last Vitamin D was still Low:  Lab Results  Component Value Date   VD25OH 41 01/19/2019    Current Outpatient Medications on File Prior to Visit  Medication Sig  . alendronate (FOSAMAX) 70 MG tablet Take 70 mg by mouth once a week. Take with a full glass of water on an empty stomach.  Marland Kitchen atenolol (TENORMIN) 100 MG tablet Take 1 tablet Daily for BP  . Cholecalciferol (VITAMIN D PO) Take 5,000 Units by mouth daily.   . Cyanocobalamin (VITAMIN B-12) 2500 MCG TABS Take 100 mcg by mouth daily.  Marland Kitchen escitalopram (LEXAPRO) 20 MG tablet TAKE 1 TABLET BY MOUTH DAILY.  .  montelukast (SINGULAIR) 10 MG tablet TAKE 1 TABLET BY MOUTH DAILY FOR ALLERGIES  . phentermine (ADIPEX-P) 37.5 MG tablet Take 1/2 to 1 tablet every Morning for Dieting & Weight Loss  . rosuvastatin (CRESTOR) 20 MG tablet Take 1 tablet Daily for Cholesterol  . topiramate (TOPAMAX) 50 MG tablet Take 1/2 to 1 tablet 2 x /day at Suppertime & Bedtime for Dieting & Weight Loss   No current facility-administered medications on file prior to visit.   Allergies  Allergen Reactions  . Lipitor [Atorvastatin]   . Wellbutrin [Bupropion]     dysphoria   Past Medical History:  Diagnosis Date  . B12 deficiency   . Hyperlipidemia   . Hypertension   . Obesity   . Prediabetes   . Vitamin D deficiency    Health Maintenance  Topic Date Due  . PAP SMEAR-Modifier  01/24/1977  . MAMMOGRAM  11/30/2014  . TETANUS/TDAP  06/19/2027  . COLONOSCOPY  04/26/2028  . INFLUENZA VACCINE  Completed  . Hepatitis C Screening  Completed  . HIV Screening  Completed   Immunization History  Administered Date(s) Administered  . Influenza Inj Mdck Quad Pf 03/09/2019  . Influenza-Unspecified 04/28/2015, 02/26/2016, 03/09/2019  . PPD Test 01/31/2014, 02/12/2015, 03/10/2016, 06/18/2017  . Pneumococcal-Unspecified 06/17/2008  . Td 04/18/2007  . Tdap 06/18/2017    Last Colon -  04/26/2018 -  Dr Earlean Shawl - recc 10 yr f/u - due Dec 2029  Last MGM - Last MGM  & dexaBMD 06/24/2018 at [facility]  Past Surgical History:  Procedure Laterality Date  . APPENDECTOMY    . BREAST BIOPSY Right 1975  . INGUINAL HERNIA REPAIR Bilateral age 66   Family History  Problem Relation Age of Onset  . Hypertension Mother   . Alzheimer's disease Mother   . Hypertension Father   . Alzheimer's disease Father    Social History   Tobacco Use  . Smoking status: Never Smoker  . Smokeless tobacco: Never Used  Substance Use Topics  . Alcohol use: Yes    Alcohol/week: 2.0 standard drinks    Types: 2 Standard drinks or  equivalent per week    Comment: wine  . Drug use: No    ROS Constitutional: Denies fever, chills, weight loss/gain, headaches, insomnia,  night sweats, and change in appetite. Does c/o fatigue. Eyes: Denies redness, blurred vision, diplopia, discharge, itchy, watery eyes.  ENT: Denies discharge, congestion, post nasal drip, epistaxis, sore throat, earache, hearing loss, dental pain, Tinnitus, Vertigo, Sinus pain, snoring.  Cardio: Denies chest pain, palpitations, irregular heartbeat, syncope, dyspnea, diaphoresis, orthopnea, PND, claudication, edema Respiratory: denies cough, dyspnea, DOE, pleurisy, hoarseness, laryngitis, wheezing.  Gastrointestinal: Denies dysphagia, heartburn, reflux, water brash, pain, cramps, nausea, vomiting, bloating, diarrhea, constipation, hematemesis, melena, hematochezia, jaundice, hemorrhoids Genitourinary: Denies dysuria, frequency, urgency, nocturia, hesitancy, discharge, hematuria, flank pain Breast: Breast lumps, nipple discharge, bleeding.  Musculoskeletal: Denies arthralgia, myalgia, stiffness, Jt. Swelling, pain, limp, and strain/sprain. Denies falls. Skin: Denies puritis, rash, hives, warts, acne, eczema, changing in skin lesion Neuro: No weakness, tremor, incoordination, spasms, paresthesia, pain Psychiatric: Denies confusion, memory loss, sensory loss. Denies Depression. Endocrine: Denies change in weight, skin, hair change, nocturia, and paresthesia, diabetic polys, visual blurring, hyper / hypo glycemic episodes.  Heme/Lymph: No excessive bleeding, bruising, enlarged lymph nodes.  Physical Exam  BP 126/74   Pulse 68   Temp (!) 97 F (36.1 C)   Resp 16   Ht 5\' 2"  (1.575 m)   Wt 240 lb 9.6 oz (109.1 kg)   BMI 44.01 kg/m   General Appearance: Over  nourished, well groomed and in no apparent distress.  Eyes: PERRLA, EOMs, conjunctiva no swelling or erythema, normal fundi and vessels. Sinuses: No frontal/maxillary tenderness ENT/Mouth: EACs  patent / TMs  nl. Nares clear without erythema, swelling, mucoid exudates. Oral hygiene is good. No erythema, swelling, or exudate. Tongue normal, non-obstructing. Tonsils not swollen or erythematous. Hearing normal.  Neck: Supple, thyroid not palpable. No bruits, nodes or JVD. Respiratory: Respiratory effort normal.  BS equal and clear bilateral without rales, rhonci, wheezing or stridor. Cardio: Heart sounds are normal with regular rate and rhythm and no murmurs, rubs or gallops. Peripheral pulses are normal and equal bilaterally without edema. No aortic or femoral bruits. Chest: symmetric with normal excursions and percussion. Breasts: Symmetric, without lumps, nipple discharge, retractions, or fibrocystic changes.  Abdomen: Flat, soft with bowel sounds active. Nontender, no guarding, rebound, hernias, masses, or organomegaly.  Lymphatics: Non tender without lymphadenopathy.  Genitourinary:  Musculoskeletal: Full ROM all peripheral extremities, joint stability, 5/5 strength, and normal gait. Skin: Warm and dry without rashes, lesions, cyanosis, clubbing or  ecchymosis.  Neuro: Cranial nerves intact, reflexes equal bilaterally. Normal muscle tone, no cerebellar symptoms. Sensation intact.  Pysch: Alert and oriented X 3, normal affect, Insight and Judgment appropriate.   Assessment and Plan  1. Annual Preventative Screening  Examination  2. Essential hypertension  - EKG 12-Lead - [place], RETROPERITNL ABD,  LTD - Urinalysis, Routine w reflex microscopic - Urine Culture - CBC with Differential/Platelet - COMPLETE METABOLIC PANEL WITH GFR - Magnesium - TSH  3. Hyperlipidemia, mixed  - EKG 12-Lead - [place], RETROPERITNL ABD,  LTD - Lipid panel - TSH  4. Abnormal glucose  - EKG 12-Lead - [place], RETROPERITNL ABD,  LTD - Hemoglobin A1c - Insulin, random  5. Vitamin D deficiency  - VITAMIN D 25 Hydroxy  6. Prediabetes  - Hemoglobin A1c  7. Morbid obesity with BMI of 40.0-44.9, adult  (HCC)  - phentermine  37.5 MG; Take 1/2-1 tab every Morning - Disp: 90 tab; Rf: 1  8. Screening for colorectal cancer  - POC Hemoccult Bld/Stl   9. Screening-pulmonary TB  - TB Skin Test  10. Screening for ischemic heart disease  - [place], RETROPERITNL ABD,  LTD  11. FH: hypertension  - EKG 12-Lead - [place], RETROPERITNL ABD,  LTD  12. Screening for AAA (aortic abdominal aneurysm)  - [place], RETROPERITNL ABD,  LTD  13. Fatigue, unspecified type  - Iron,Total/Total Iron Binding Cap - Vitamin B12 - CBC with Differential/Platelet - TSH  14. Medication management  - Urinalysis, Routine w reflex microscopic - Urine Culture - CBC with Differential/Platelet - COMPLETE METABOLIC PANEL WITH GFR - Magnesium - Lipid panel - TSH - Hemoglobin A1c - Insulin, random - VITAMIN D 25 Hydroxy         Patient was counseled in prudent diet to achieve/maintain BMI less than 25 for weight control, BP monitoring, regular exercise and medications. Discussed med's effects and SE's. Screening labs and tests as requested with regular follow-up as recommended. Over 40 minutes of exam, counseling, chart review and high complex critical decision making was performed.   Kirtland Bouchard, MD

## 2019-07-29 ENCOUNTER — Other Ambulatory Visit: Payer: Self-pay | Admitting: Internal Medicine

## 2019-07-29 DIAGNOSIS — D500 Iron deficiency anemia secondary to blood loss (chronic): Secondary | ICD-10-CM

## 2019-07-30 ENCOUNTER — Encounter: Payer: Self-pay | Admitting: Internal Medicine

## 2019-07-30 LAB — COMPLETE METABOLIC PANEL WITH GFR
AG Ratio: 1.5 (calc) (ref 1.0–2.5)
ALT: 10 U/L (ref 6–29)
AST: 15 U/L (ref 10–35)
Albumin: 4.1 g/dL (ref 3.6–5.1)
Alkaline phosphatase (APISO): 71 U/L (ref 37–153)
BUN: 17 mg/dL (ref 7–25)
CO2: 30 mmol/L (ref 20–32)
Calcium: 9.7 mg/dL (ref 8.6–10.4)
Chloride: 104 mmol/L (ref 98–110)
Creat: 0.69 mg/dL (ref 0.50–0.99)
GFR, Est African American: 107 mL/min/{1.73_m2} (ref >=60)
GFR, Est Non African American: 93 mL/min/{1.73_m2} (ref >=60)
Globulin: 2.8 g/dL (calc) (ref 1.9–3.7)
Glucose, Bld: 90 mg/dL (ref 65–99)
Potassium: 4.6 mmol/L (ref 3.5–5.3)
Sodium: 142 mmol/L (ref 135–146)
Total Bilirubin: 0.3 mg/dL (ref 0.2–1.2)
Total Protein: 6.9 g/dL (ref 6.1–8.1)

## 2019-07-30 LAB — URINALYSIS, ROUTINE W REFLEX MICROSCOPIC
Bilirubin Urine: NEGATIVE
Glucose, UA: NEGATIVE
Hgb urine dipstick: NEGATIVE
Ketones, ur: NEGATIVE
Leukocytes,Ua: NEGATIVE
Nitrite: NEGATIVE
Protein, ur: NEGATIVE
Specific Gravity, Urine: 1.014 (ref 1.001–1.03)
pH: 7 (ref 5.0–8.0)

## 2019-07-30 LAB — CBC WITH DIFFERENTIAL/PLATELET
Absolute Monocytes: 662 cells/uL (ref 200–950)
Basophils Absolute: 64 cells/uL (ref 0–200)
Basophils Relative: 0.7 %
Eosinophils Absolute: 304 cells/uL (ref 15–500)
Eosinophils Relative: 3.3 %
HCT: 33.5 % — ABNORMAL LOW (ref 35.0–45.0)
Hemoglobin: 10 g/dL — ABNORMAL LOW (ref 11.7–15.5)
Lymphs Abs: 2521 cells/uL (ref 850–3900)
MCH: 23.1 pg — ABNORMAL LOW (ref 27.0–33.0)
MCHC: 29.9 g/dL — ABNORMAL LOW (ref 32.0–36.0)
MCV: 77.5 fL — ABNORMAL LOW (ref 80.0–100.0)
MPV: 10.8 fL (ref 7.5–12.5)
Monocytes Relative: 7.2 %
Neutro Abs: 5649 cells/uL (ref 1500–7800)
Neutrophils Relative %: 61.4 %
Platelets: 263 10*3/uL (ref 140–400)
RBC: 4.32 10*6/uL (ref 3.80–5.10)
RDW: 14.1 % (ref 11.0–15.0)
Total Lymphocyte: 27.4 %
WBC: 9.2 10*3/uL (ref 3.8–10.8)

## 2019-07-30 LAB — URINE CULTURE
MICRO NUMBER:: 10192388
Result:: NO GROWTH
SPECIMEN QUALITY:: ADEQUATE

## 2019-07-30 LAB — IRON, TOTAL/TOTAL IRON BINDING CAP
%SAT: 6 % (calc) — ABNORMAL LOW (ref 16–45)
Iron: 29 ug/dL — ABNORMAL LOW (ref 45–160)
TIBC: 497 mcg/dL (calc) — ABNORMAL HIGH (ref 250–450)

## 2019-07-30 LAB — LIPID PANEL
Cholesterol: 168 mg/dL (ref <200)
HDL: 47 mg/dL — ABNORMAL LOW (ref >=50)
LDL Cholesterol (Calc): 95 mg/dL (calc)
Non-HDL Cholesterol (Calc): 121 mg/dL (calc) (ref <130)
Total CHOL/HDL Ratio: 3.6 (calc) (ref <5.0)
Triglycerides: 162 mg/dL — ABNORMAL HIGH (ref <150)

## 2019-07-30 LAB — HEMOGLOBIN A1C
Hgb A1c MFr Bld: 5.9 % of total Hgb — ABNORMAL HIGH (ref <5.7)
Mean Plasma Glucose: 123 (calc)
eAG (mmol/L): 6.8 (calc)

## 2019-07-30 LAB — INSULIN, RANDOM: Insulin: 9.6 u[IU]/mL

## 2019-07-30 LAB — TSH: TSH: 2.44 mIU/L (ref 0.40–4.50)

## 2019-07-30 LAB — VITAMIN B12: Vitamin B-12: 995 pg/mL (ref 200–1100)

## 2019-07-30 LAB — MAGNESIUM: Magnesium: 2 mg/dL (ref 1.5–2.5)

## 2019-07-30 LAB — VITAMIN D 25 HYDROXY (VIT D DEFICIENCY, FRACTURES): Vit D, 25-Hydroxy: 46 ng/mL (ref 30–100)

## 2019-08-17 DIAGNOSIS — D509 Iron deficiency anemia, unspecified: Secondary | ICD-10-CM | POA: Insufficient documentation

## 2019-09-08 ENCOUNTER — Other Ambulatory Visit: Payer: Self-pay

## 2019-09-08 DIAGNOSIS — Z1212 Encounter for screening for malignant neoplasm of rectum: Secondary | ICD-10-CM

## 2019-09-08 DIAGNOSIS — Z1211 Encounter for screening for malignant neoplasm of colon: Secondary | ICD-10-CM

## 2019-09-08 LAB — POC HEMOCCULT BLD/STL (HOME/3-CARD/SCREEN)
Card #2 Fecal Occult Blod, POC: NEGATIVE
Card #3 Fecal Occult Blood, POC: NEGATIVE
Fecal Occult Blood, POC: NEGATIVE

## 2019-10-26 ENCOUNTER — Ambulatory Visit: Payer: BC Managed Care – PPO | Admitting: Adult Health

## 2019-11-02 NOTE — Progress Notes (Signed)
FOLLOW UP  Assessment and Plan:   Hypertension Well controlled with current medications  Monitor blood pressure at home; patient to call if consistently greater than 130/80 Continue DASH diet.   Reminder to go to the ER if any CP, SOB, nausea, dizziness, severe HA, changes vision/speech, left arm numbness and tingling and jaw pain.  Cholesterol Currently at goal; continue rosuvastatin   Continue low cholesterol diet and exercise.  Check lipid panel.   Other abnormal glucose (prediabetes) Discussed diet/exercise, weight management  Check A1C q2m; check CMP  Morbid Obesity with co morbidities Long discussion about weight loss, diet, and exercise Recommended diet heavy in fruits and veggies and low in animal meats, cheeses, and dairy products, appropriate calorie intake Discussed ideal weight for height and initial weight goal (225lb) Patient will work on focusing on fruit and veggie intake (stretegies discussed) and lean protein intake, cut back on sweets, exercise as tolerated PUSH water intake Patient on phentermine with benefit and no SE, NEEDS TO TAKE A BREAK - hold for 1-2 months; continue close follow up.  Will follow up in 3 months  Vitamin D Def Below goal at last visit; she has not changed her dose  continue supplementation to maintain goal of 60-100 Defer Vit D level  Anxiety Well managed by current regimen; continue medications due to pandemic, but attempt taper next year, improved since retirement Stress management techniques discussed, increase water, good sleep hygiene discussed, increase exercise, and increase veggies.   Dyspnea with minimal exertion/fatigue Has had normal EKG since onset, gradual progressive over 6 months, now reporting sig fatigue and dyspnea with minimal exertion - 1 year ago was able to walk 1 mile, now short of breath taking dog out to yard She wonders if anemia is worse - check CBC, iron - admits to poor compliance due to intolerance, if  worse consider referral to hematology Get CXR, labs have been otherwise unremarkable, check routine today Plan to enter referral to cardiology pending above results for perfusion study/ECHO, reports was seen for murmur heard today, not new No edema, PND, CP Could be simple decompensation Go to the ER if any chest pain, worsening shortness of breath, nausea, dizziness, syncope   Continue diet and meds as discussed. Further disposition pending results of labs. Discussed med's effects and SE's.   Over 30 minutes of exam, counseling, chart review, and critical decision making was performed.   Future Appointments  Date Time Provider [place]  01/30/2020 11:30 AM Unk Pinto, MD [facility] None  08/14/2020  2:00 PM Unk Pinto, MD [facility] None    ----------------------------------------------------------------------------------------------------------------------  HPI 64 y.o. female  presents for 3 month follow up on hypertension, cholesterol, prediabetes, morbid obesity and vitamin D deficiency.   She recently came home from week long vacation to [place]; while there developed painful rash of L chest wall/under breast and was diagnosed with shingles at [facility] and was started on valtrex with some improvement.   She is on fosamax weekly for osteoporosis since 06/2018 via GYN, admits hasn't been taking.   She is currently taking lexapro 20 mg daily that was started for work related stress/anxiety several years ago, doing much better since retiring in July 2019. Thinking about tapering off next year if doing well after the holidays.   she is prescribed phentermine for weight loss, taking 1 tab daily in the AM since last visit, will skip on the weekends sometimes. While on the medication they have lost 0 lbs since last visit. They deny  palpitations, anxiety, trouble sleeping, elevated BP.   BMI is Body mass index is 44.08 kg/m., she is working on diet and exercise, admits  hasn't been eating as well as she should in the last 6 months, snacking on sweets throughout the day.  Wt Readings from Last 3 Encounters:  11/03/19 241 lb (109.3 kg)  07/28/19 240 lb 9.6 oz (109.1 kg)  01/19/19 237 lb 9.6 oz (107.8 kg)   Exercise: some walking with dog - however dyspnea with minimal exertion Water intake: admits not as good as she should be - can't quantify, has increased   Her blood pressure has been controlled at home, today their BP is BP: 124/74  She does not workout. She denies chest pain, dizziness. Endorses fatigue and dyspnea with minimal exertion x ~6 months. She attributed to weight, being out of shape, ? Anemia. Gets short of breath getting out of bed to take the dog out to her yard or any significant exertion, stairs, lifting. Had normal EKG in 07/28/2019 since onset of sx. Denies CP, palpitations, edema, dizziness, PND, orthopnea. She was walking ~1 mile last year without difficulty, stopped walking over summer and winter, has noted gradual onset of exertion intolerance ~6 months.    She is on cholesterol medication Rosuvastatin 20 mg every day and denies myalgias. Her cholesterol is not at goal. The cholesterol last visit was:   Lab Results  Component Value Date   CHOL 168 07/28/2019   HDL 47 (L) 07/28/2019   LDLCALC 95 07/28/2019   TRIG 162 (H) 07/28/2019   CHOLHDL 3.6 07/28/2019    She has been working on diet prediabetes, and denies increased appetite, nausea, paresthesia of the feet, polydipsia, polyuria and visual disturbances. Last A1C in the office was:  Lab Results  Component Value Date   HGBA1C 5.9 (H) 07/28/2019   Patient is on Vitamin D supplement, taking 5000 IU daily  Lab Results  Component Value Date   VD25OH 46 07/28/2019      She has anemia; iron deficiency, had negative hemoccult x 3 in April 2021, was on iron supplement but stopped in recent weeks, Had negatvie EGD and colonosocpy with Dr. Earlean Shawl in Nov 2019 for anemia workup. Admits  not taking iron supplement very regularly due to GI SE.  Lab Results  Component Value Date   WBC 9.2 07/28/2019   HGB 10.0 (L) 07/28/2019   HCT 33.5 (L) 07/28/2019   MCV 77.5 (L) 07/28/2019   PLT 263 07/28/2019   Lab Results  Component Value Date   IRON 29 (L) 07/28/2019   TIBC 497 (H) 07/28/2019   FERRITIN 4 (L) 04/19/2018   Lab Results  Component Value Date   [id number] 07/28/2019     Current Medications:  Current Outpatient Medications on File Prior to Visit  Medication Sig  . atenolol (TENORMIN) 100 MG tablet Take 1 tablet Daily for BP  . Cholecalciferol (VITAMIN D PO) Take 5,000 Units by mouth daily.   Marland Kitchen escitalopram (LEXAPRO) 20 MG tablet TAKE 1 TABLET BY MOUTH DAILY.  . montelukast (SINGULAIR) 10 MG tablet TAKE 1 TABLET BY MOUTH DAILY FOR ALLERGIES  . phentermine (ADIPEX-P) 37.5 MG tablet Take 1/2 to 1 tablet every Morning for Dieting & Weight Loss  . rosuvastatin (CRESTOR) 20 MG tablet Take 1 tablet Daily for Cholesterol  . alendronate (FOSAMAX) 70 MG tablet Take 70 mg by mouth once a week. Take with a full glass of water on an empty stomach.  . topiramate (TOPAMAX) 50  MG tablet Take 1/2 to 1 tablet 2 x /day at Suppertime & Bedtime for Dieting & Weight Loss (Patient not taking: Reported on 11/03/2019)   No current facility-administered medications on file prior to visit.     Allergies:  Allergies  Allergen Reactions  . Lipitor [Atorvastatin]   . Wellbutrin [Bupropion]     dysphoria     Medical History:  Past Medical History:  Diagnosis Date  . B12 deficiency   . Hyperlipidemia   . Hypertension   . Obesity   . Prediabetes   . Vitamin D deficiency    Family history- Reviewed and unchanged Social history- Reviewed and unchanged   Review of Systems:  Review of Systems  Constitutional: Positive for malaise/fatigue. Negative for chills, fever and weight loss.  HENT: Negative for hearing loss, sore throat and tinnitus.   Eyes: Negative for blurred  vision and double vision.  Respiratory: Positive for shortness of breath (when bending over in the AM). Negative for cough and wheezing.   Cardiovascular: Negative for chest pain, palpitations, orthopnea, claudication and leg swelling.  Gastrointestinal: Negative for abdominal pain, blood in stool, constipation, diarrhea, heartburn, melena, nausea and vomiting.  Genitourinary: Negative.  Negative for hematuria.  Musculoskeletal: Negative for falls, joint pain and myalgias.  Skin: Negative for rash.  Neurological: Negative for dizziness, tingling, sensory change, weakness and headaches.  Endo/Heme/Allergies: Negative for polydipsia.  Psychiatric/Behavioral: Negative.  Negative for depression and substance abuse. The patient is not nervous/anxious and does not have insomnia.   All other systems reviewed and are negative.    Physical Exam: BP 124/74   Pulse 82   Temp (!) 97 F (36.1 C)   Ht 5\' 2"  (1.575 m)   Wt 241 lb (109.3 kg)   SpO2 97%   BMI 44.08 kg/m  Wt Readings from Last 3 Encounters:  11/03/19 241 lb (109.3 kg)  07/28/19 240 lb 9.6 oz (109.1 kg)  01/19/19 237 lb 9.6 oz (107.8 kg)   General Appearance: Well nourished, morbidly obese female, in no apparent distress. Eyes: PERRLA, EOMs, conjunctiva no swelling or erythema Sinuses: No Frontal/maxillary tenderness ENT/Mouth: Ext aud canals clear, TMs without erythema, bulging. No erythema, swelling, or exudate on post pharynx.  Tonsils not swollen or erythematous. Hearing normal.  Neck: Supple, thyroid normal.  Respiratory: Respiratory effort normal, BS equal bilaterally without rales, rhonchi, wheezing or stridor.  Cardio: RRR with 2/6 systolic murmur, no radiation (per patient not new, has seen cardiology remotely, no recommendations). Intact symmetrical peripheral pulses without edema.  Abdomen: Soft, obese abdomen + BS.  Non tender, no guarding, rebound, hernias, masses. Lymphatics: Non tender without lymphadenopathy.   Musculoskeletal: Full ROM, 5/5 strength, Normal gait Skin: Warm, dry without rashes, lesions, ecchymosis.  Neuro: Cranial nerves intact. No cerebellar symptoms.  Psych: Awake and oriented X 3, normal affect, Insight and Judgment appropriate.     Izora Ribas, NP 10:16 AM [facility]

## 2019-11-03 ENCOUNTER — Ambulatory Visit
Admission: RE | Admit: 2019-11-03 | Discharge: 2019-11-03 | Disposition: A | Discharge status: Home / Self Care | Payer: BC Managed Care – PPO | Source: Ambulatory Visit | Attending: Adult Health | Admitting: Adult Health

## 2019-11-03 ENCOUNTER — Ambulatory Visit (INDEPENDENT_AMBULATORY_CARE_PROVIDER_SITE_OTHER): Payer: BC Managed Care – PPO | Admitting: Adult Health

## 2019-11-03 ENCOUNTER — Ambulatory Visit: Payer: BC Managed Care – PPO | Admitting: Adult Health

## 2019-11-03 ENCOUNTER — Other Ambulatory Visit: Payer: Self-pay

## 2019-11-03 ENCOUNTER — Encounter: Payer: Self-pay | Admitting: Adult Health

## 2019-11-03 VITALS — BP 124/74 | HR 82 | Temp 97.0°F | Ht 62.0 in | Wt 241.0 lb

## 2019-11-03 DIAGNOSIS — D509 Iron deficiency anemia, unspecified: Secondary | ICD-10-CM

## 2019-11-03 DIAGNOSIS — R0609 Other forms of dyspnea: Secondary | ICD-10-CM

## 2019-11-03 DIAGNOSIS — Z79899 Other long term (current) drug therapy: Secondary | ICD-10-CM | POA: Diagnosis not present

## 2019-11-03 DIAGNOSIS — E559 Vitamin D deficiency, unspecified: Secondary | ICD-10-CM | POA: Diagnosis not present

## 2019-11-03 DIAGNOSIS — F419 Anxiety disorder, unspecified: Secondary | ICD-10-CM

## 2019-11-03 DIAGNOSIS — M3500 Sicca syndrome, unspecified: Secondary | ICD-10-CM

## 2019-11-03 DIAGNOSIS — I10 Essential (primary) hypertension: Secondary | ICD-10-CM

## 2019-11-03 DIAGNOSIS — E782 Mixed hyperlipidemia: Secondary | ICD-10-CM

## 2019-11-03 DIAGNOSIS — R011 Cardiac murmur, unspecified: Secondary | ICD-10-CM

## 2019-11-03 DIAGNOSIS — R7309 Other abnormal glucose: Secondary | ICD-10-CM

## 2019-11-03 DIAGNOSIS — R0600 Dyspnea, unspecified: Secondary | ICD-10-CM

## 2019-11-03 DIAGNOSIS — R5383 Other fatigue: Secondary | ICD-10-CM

## 2019-11-03 MED ORDER — GABAPENTIN 100 MG PO CAPS: 100.0000 mg | ORAL_CAPSULE | Freq: Three times a day (TID) | ORAL | 2 refills | Status: DC

## 2019-11-03 NOTE — Patient Instructions (Addendum)
Goals    . DIET - INCREASE WATER INTAKE     65+ fluid ounces of water or unsweet/clear liquids    . Exercise 3x per week (20-30 min per time)    . Weight (lb) < 225 lb (102.1 kg)      Please get chest xray at [address] [facility]     Keep sweets out of home   Avoid processed carb (flour, sugar, white potatoes)       Drink 1/2 your body weight in fluid ounces of water daily; drink a tall glass of water 30 min before meals  Don't eat until you're stuffed- listen to your stomach and eat until you are 80% full   Try eating off of a salad plate; wait 10 min after finishing before going back for seconds  Start by eating the vegetables on your plate; aim for 50% of your meals to be fruits or vegetables  Then eat your protein - lean meats (grass fed if possible), fish, beans, nuts in moderation  Eat your carbs/starch last ONLY if you still are hungry. If you can, stop before finishing it all  Avoid sugar and flour - the closer it looks to it's original form in nature, typically the better it is for you  Splurge in moderation - "assign" days when you get to splurge and have the "bad stuff" - I like to follow a 80% - 20% plan- "good" choices 80 % of the time, "bad" choices in moderation 20% of the time  Simple equation is: Calories out > calories in = weight loss - even if you eat the bad stuff, if you limit portions, you will still lose weight    High-Fiber Diet Fiber, also called dietary fiber, is a type of carbohydrate that is found in fruits, vegetables, whole grains, and beans. A high-fiber diet can have many health benefits. Your health care provider may recommend a high-fiber diet to help:  Prevent constipation. Fiber can make your bowel movements more regular.  Lower your cholesterol.  Relieve the following conditions: ? Swelling of veins in the anus (hemorrhoids). ? Swelling and irritation (inflammation) of specific areas of the digestive tract  (uncomplicated diverticulosis). ? A problem of the large intestine (colon) that sometimes causes pain and diarrhea (irritable bowel syndrome, IBS).  Prevent overeating as part of a weight-loss plan.  Prevent heart disease, type 2 diabetes, and certain cancers. What is my plan? The recommended daily fiber intake in grams (g) includes:  38 g for men age 64 or younger.  30 g for men over age 21.  24 g for women age 16 or younger.  21 g for women over age 64. You can get the recommended daily intake of dietary fiber by:  Eating a variety of fruits, vegetables, grains, and beans.  Taking a fiber supplement, if it is not possible to get enough fiber through your diet. What do I need to know about a high-fiber diet?  It is better to get fiber through food sources rather than from fiber supplements. There is not a lot of research about how effective supplements are.  Always check the fiber content on the nutrition facts label of any prepackaged food. Look for foods that contain 5 g of fiber or more per serving.  Talk with a diet and nutrition specialist (dietitian) if you have questions about specific foods that are recommended or not recommended for your medical condition, especially if those foods are not listed below.  Gradually increase how much fiber you consume. If you increase your intake of dietary fiber too quickly, you may have bloating, cramping, or gas.  Drink plenty of water. Water helps you to digest fiber. What are tips for following this plan?  Eat a wide variety of high-fiber foods.  Make sure that half of the grains that you eat each day are whole grains.  Eat breads and cereals that are made with whole-grain flour instead of refined flour or white flour.  Eat brown rice, bulgur wheat, or millet instead of white rice.  Start the day with a breakfast that is high in fiber, such as a cereal that contains 5 g of fiber or more per serving.  Use beans in place of meat  in soups, salads, and pasta dishes.  Eat high-fiber snacks, such as berries, raw vegetables, nuts, and popcorn.  Choose whole fruits and vegetables instead of processed forms like juice or sauce. What foods can I eat?  Fruits Berries. Pears. Apples. Oranges. Avocado. Prunes and raisins. Dried figs. Vegetables Sweet potatoes. Spinach. Kale. Artichokes. Cabbage. Broccoli. Cauliflower. Green peas. Carrots. Squash. Grains Whole-grain breads. Multigrain cereal. Oats and oatmeal. Brown rice. Barley. Bulgur wheat. [place]. Quinoa. Bran muffins. Popcorn. Rye wafer crackers. Meats and other proteins Navy, kidney, and pinto beans. Soybeans. Split peas. Lentils. Nuts and seeds. Dairy Fiber-fortified yogurt. Beverages Fiber-fortified soy milk. Fiber-fortified orange juice. Other foods Fiber bars. The items listed above may not be a complete list of recommended foods and beverages. Contact a dietitian for more options. What foods are not recommended? Fruits Fruit juice. Cooked, strained fruit. Vegetables Fried potatoes. Canned vegetables. Well-cooked vegetables. Grains White bread. Pasta made with refined flour. White rice. Meats and other proteins Fatty cuts of meat. Fried chicken or fried fish. Dairy Milk. Yogurt. Cream cheese. Sour cream. Fats and oils Butters. Beverages Soft drinks. Other foods Cakes and pastries. The items listed above may not be a complete list of foods and beverages to avoid. Contact a dietitian for more information. Summary  Fiber is a type of carbohydrate. It is found in fruits, vegetables, whole grains, and beans.  There are many health benefits of eating a high-fiber diet, such as preventing constipation, lowering blood cholesterol, helping with weight loss, and reducing your risk of heart disease, diabetes, and certain cancers.  Gradually increase your intake of fiber. Increasing too fast can result in cramping, bloating, and gas. Drink plenty of water  while you increase your fiber.  The best sources of fiber include whole fruits and vegetables, whole grains, nuts, seeds, and beans. This information is not intended to replace advice given to you by your health care provider. Make sure you discuss any questions you have with your health care provider. Document Revised: 03/23/2017 Document Reviewed: 03/23/2017 Elsevier Patient Education  2020 [employer].

## 2019-11-04 ENCOUNTER — Other Ambulatory Visit: Payer: Self-pay | Admitting: Adult Health

## 2019-11-04 ENCOUNTER — Encounter: Payer: Self-pay | Admitting: Adult Health

## 2019-11-04 ENCOUNTER — Telehealth: Payer: Self-pay | Admitting: Hematology

## 2019-11-04 DIAGNOSIS — K449 Diaphragmatic hernia without obstruction or gangrene: Secondary | ICD-10-CM | POA: Insufficient documentation

## 2019-11-04 DIAGNOSIS — R7989 Other specified abnormal findings of blood chemistry: Secondary | ICD-10-CM

## 2019-11-04 DIAGNOSIS — D509 Iron deficiency anemia, unspecified: Secondary | ICD-10-CM

## 2019-11-04 DIAGNOSIS — D649 Anemia, unspecified: Secondary | ICD-10-CM

## 2019-11-04 LAB — TSH: TSH: 2.24 mIU/L (ref 0.40–4.50)

## 2019-11-04 LAB — CBC WITH DIFFERENTIAL/PLATELET
Absolute Monocytes: 608 cells/uL (ref 200–950)
Basophils Absolute: 47 cells/uL (ref 0–200)
Basophils Relative: 0.6 %
Eosinophils Absolute: 269 cells/uL (ref 15–500)
Eosinophils Relative: 3.4 %
HCT: 27.7 % — ABNORMAL LOW (ref 35.0–45.0)
Hemoglobin: 8.2 g/dL — ABNORMAL LOW (ref 11.7–15.5)
Lymphs Abs: 1888 cells/uL (ref 850–3900)
MCH: 23.5 pg — ABNORMAL LOW (ref 27.0–33.0)
MCHC: 29.6 g/dL — ABNORMAL LOW (ref 32.0–36.0)
MCV: 79.4 fL — ABNORMAL LOW (ref 80.0–100.0)
MPV: 10.4 fL (ref 7.5–12.5)
Monocytes Relative: 7.7 %
Neutro Abs: 5088 cells/uL (ref 1500–7800)
Neutrophils Relative %: 64.4 %
Platelets: 267 10*3/uL (ref 140–400)
RBC: 3.49 10*6/uL — ABNORMAL LOW (ref 3.80–5.10)
RDW: 18.7 % — ABNORMAL HIGH (ref 11.0–15.0)
Total Lymphocyte: 23.9 %
WBC: 7.9 10*3/uL (ref 3.8–10.8)

## 2019-11-04 LAB — COMPLETE METABOLIC PANEL WITH GFR
AG Ratio: 1.6 (calc) (ref 1.0–2.5)
ALT: 43 U/L — ABNORMAL HIGH (ref 6–29)
AST: 35 U/L (ref 10–35)
Albumin: 3.9 g/dL (ref 3.6–5.1)
Alkaline phosphatase (APISO): 67 U/L (ref 37–153)
BUN: 16 mg/dL (ref 7–25)
CO2: 30 mmol/L (ref 20–32)
Calcium: 9.6 mg/dL (ref 8.6–10.4)
Chloride: 106 mmol/L (ref 98–110)
Creat: 0.68 mg/dL (ref 0.50–0.99)
GFR, Est African American: 108 mL/min/{1.73_m2} (ref >=60)
GFR, Est Non African American: 93 mL/min/{1.73_m2} (ref >=60)
Globulin: 2.5 g/dL (calc) (ref 1.9–3.7)
Glucose, Bld: 97 mg/dL (ref 65–99)
Potassium: 4.6 mmol/L (ref 3.5–5.3)
Sodium: 141 mmol/L (ref 135–146)
Total Bilirubin: 0.3 mg/dL (ref 0.2–1.2)
Total Protein: 6.4 g/dL (ref 6.1–8.1)

## 2019-11-04 LAB — MAGNESIUM: Magnesium: 2.1 mg/dL (ref 1.5–2.5)

## 2019-11-04 LAB — LIPID PANEL
Cholesterol: 151 mg/dL (ref <200)
HDL: 44 mg/dL — ABNORMAL LOW (ref >=50)
LDL Cholesterol (Calc): 76 mg/dL (calc)
Non-HDL Cholesterol (Calc): 107 mg/dL (calc) (ref <130)
Total CHOL/HDL Ratio: 3.4 (calc) (ref <5.0)
Triglycerides: 214 mg/dL — ABNORMAL HIGH (ref <150)

## 2019-11-04 LAB — IRON,TIBC AND FERRITIN PANEL
%SAT: 4 % (calc) — ABNORMAL LOW (ref 16–45)
Ferritin: 5 ng/mL — ABNORMAL LOW (ref 16–288)
Iron: 22 ug/dL — ABNORMAL LOW (ref 45–160)
TIBC: 526 mcg/dL (calc) — ABNORMAL HIGH (ref 250–450)

## 2019-11-04 NOTE — Telephone Encounter (Signed)
Received a new hem referral from Dr. Melford Aase for symptomatic anemia. Ms. Whitney Herrera returned my call and has been scheduled to see Dr. Irene Limbo on 6/9 at 1pm. Pt aware to arrive 15 minutes early.

## 2019-11-09 ENCOUNTER — Other Ambulatory Visit: Payer: Self-pay

## 2019-11-09 ENCOUNTER — Inpatient Hospital Stay: Payer: BC Managed Care – PPO

## 2019-11-09 ENCOUNTER — Telehealth: Payer: Self-pay | Admitting: Hematology

## 2019-11-09 ENCOUNTER — Inpatient Hospital Stay: Payer: BC Managed Care – PPO | Attending: Hematology | Admitting: Hematology

## 2019-11-09 VITALS — BP 162/83 | HR 88 | Temp 98.1°F | Resp 18 | Ht 62.0 in | Wt 239.5 lb

## 2019-11-09 DIAGNOSIS — Z6841 Body Mass Index (BMI) 40.0 and over, adult: Secondary | ICD-10-CM | POA: Insufficient documentation

## 2019-11-09 DIAGNOSIS — D509 Iron deficiency anemia, unspecified: Secondary | ICD-10-CM

## 2019-11-09 LAB — CBC WITH DIFFERENTIAL/PLATELET
Abs Immature Granulocytes: 0.07 10*3/uL (ref 0.00–0.07)
Basophils Absolute: 0.1 10*3/uL (ref 0.0–0.1)
Basophils Relative: 1 %
Eosinophils Absolute: 0.3 10*3/uL (ref 0.0–0.5)
Eosinophils Relative: 3 %
HCT: 30.2 % — ABNORMAL LOW (ref 36.0–46.0)
Hemoglobin: 8.7 g/dL — ABNORMAL LOW (ref 12.0–15.0)
Immature Granulocytes: 1 %
Lymphocytes Relative: 25 %
Lymphs Abs: 2.6 10*3/uL (ref 0.7–4.0)
MCH: 23.5 pg — ABNORMAL LOW (ref 26.0–34.0)
MCHC: 28.8 g/dL — ABNORMAL LOW (ref 30.0–36.0)
MCV: 81.4 fL (ref 80.0–100.0)
Monocytes Absolute: 0.7 10*3/uL (ref 0.1–1.0)
Monocytes Relative: 7 %
Neutro Abs: 6.8 10*3/uL (ref 1.7–7.7)
Neutrophils Relative %: 63 %
Platelets: 304 10*3/uL (ref 150–400)
RBC: 3.71 MIL/uL — ABNORMAL LOW (ref 3.87–5.11)
RDW: 19.1 % — ABNORMAL HIGH (ref 11.5–15.5)
WBC: 10.5 10*3/uL (ref 4.0–10.5)
nRBC: 0 % (ref 0.0–0.2)

## 2019-11-09 LAB — FERRITIN: Ferritin: <4 ng/mL — ABNORMAL LOW (ref 11–307)

## 2019-11-09 LAB — CMP (CANCER CENTER ONLY)
ALT: 19 U/L (ref 0–44)
AST: 14 U/L — ABNORMAL LOW (ref 15–41)
Albumin: 3.6 g/dL (ref 3.5–5.0)
Alkaline Phosphatase: 87 U/L (ref 38–126)
Anion gap: 13 (ref 5–15)
BUN: 15 mg/dL (ref 8–23)
CO2: 25 mmol/L (ref 22–32)
Calcium: 9.8 mg/dL (ref 8.9–10.3)
Chloride: 104 mmol/L (ref 98–111)
Creatinine: 0.77 mg/dL (ref 0.44–1.00)
GFR, Est AFR Am: >60 mL/min (ref >60)
GFR, Estimated: >60 mL/min (ref >60)
Glucose, Bld: 115 mg/dL — ABNORMAL HIGH (ref 70–99)
Potassium: 3.9 mmol/L (ref 3.5–5.1)
Sodium: 142 mmol/L (ref 135–145)
Total Bilirubin: 0.2 mg/dL — ABNORMAL LOW (ref 0.3–1.2)
Total Protein: 7.3 g/dL (ref 6.5–8.1)

## 2019-11-09 LAB — IRON AND TIBC
Iron: 21 ug/dL — ABNORMAL LOW (ref 41–142)
Saturation Ratios: 4 % — ABNORMAL LOW (ref 21–57)
TIBC: 559 ug/dL — ABNORMAL HIGH (ref 236–444)
UIBC: 538 ug/dL — ABNORMAL HIGH (ref 120–384)

## 2019-11-09 LAB — VITAMIN B12: Vitamin B-12: 420 pg/mL (ref 180–914)

## 2019-11-09 NOTE — Patient Instructions (Signed)
Thank you for choosing [facility] to provide your oncology and hematology care.   Should you have questions after your visit to the [facility] ([facility]), please contact this office at [phone] between 8:30 AM and 4:30 PM.  Voice mails left after 4:00 PM may not be returned until the following business day.  Calls received after 4:30 PM will be answered by an off-site Nurse Triage Line.    Prescription Refills:  Please have your pharmacy contact us directly for most prescription requests.  Contact the office directly for refills of narcotics (pain medications). Allow 48-72 hours for refills.  Appointments: Please contact the [facility] scheduling department [phone] for questions regarding [facility] appointment scheduling.  Contact the schedulers with any scheduling changes so that your appointment can be rescheduled in a timely manner.   Central Scheduling for [place] [phone] - Call to schedule procedures such as PET scans, CT scans, MRI, Ultrasound, etc.  To afford each patient quality time with our providers, please arrive 30 minutes before your scheduled appointment time.  If you arrive late for your appointment, you may be asked to reschedule.  We strive to give you quality time with our providers, and arriving late affects you and other patients whose appointments are after yours. If you are a no show for multiple scheduled visits, you may be dismissed from the clinic at the providers discretion.     Resources: [facility] Social Workers [phone] for additional information on assistance programs or assistance connecting with community support programs   [place] DSS  [phone]: Information regarding food stamps, Medicaid, and utility assistance [place]AT 336-333-6589   [place] [employer]'s shared-ride transportation service for eligible riders who have a disability that prevents them from riding the fixed route bus.   Medicare Rights Center  800-333-4114 Helps people with Medicare understand their rights and benefits, navigate the Medicare system, and secure the quality healthcare they deserve [employer] 800-227-2345 Assists patients locate various types of support and financial assistance Cancer Care: 1-800-813-HOPE (4673) Provides financial assistance, online support groups, medication/co-pay assistance.   Transportation Assistance for appointments at [facility]: Transportation Coordinator [phone]  Again, thank you for choosing [facility] for your care.       

## 2019-11-09 NOTE — Progress Notes (Signed)
HEMATOLOGY/ONCOLOGY CONSULTATION NOTE  Date of Service: 11/09/2019  Patient Care Team: Unk Pinto, MD as PCP - General (Internal Medicine)  CHIEF COMPLAINTS/PURPOSE OF CONSULTATION:  IDA  HISTORY OF PRESENTING ILLNESS:   Whitney Herrera is a wonderful 64 y.o. female who has been referred to [place] by Dr. Melford Aase for evaluation and management of iron deficiency anemia. The pt reports that she is doing well overall.  The pt reports that she was told that she was iron deficient about a year ago. During this time she had a Colonoscopy, EGD, and stool sample testing with Dr. Earlean Shawl who found nothing of concern. She was then placed on PO Iron, which corrected her blood counts. Pt had some stomach upset with PO Iron, but it was not too bothersome. She repeated stool sample testing with Chancy Hurter, CMA in April which was negative. Pt has never required an IV Iron infusion.   She has no known food intolerances and has used acid suppressants occasionally. Pt has felt more SOB and fatigued over the last few months, but denies any abnormal cravings. She denies any abnormal/excessive bleeding. She feels leg restlessness as she lays down to sleep sometimes. Pt was diagnosed with Shingles last week, but was treated and her symptoms are resolving.   Most recent lab results (11/03/2019) of CBC is as follows: all values are WNL except for RBC at 3.49, Hgb at 8.2, HCT at 27.7, MCV at 79.4, MCH at 23.5, MCHC at 29.6, RDW at 18.7, ALT at 43. 11/09/2019 Iron Panel is as follows: Iron at 22, TIBC at 526, Sat Ratios at 4, Ferritin at 5  On review of systems, pt reports fatigue, SOB, restless legs and denies bloody/black stools, gum bleeds, nose bleeds, hematuria, abdominal pain, rashes, ice cravings  and any other symptoms.   On PMHx the pt reports Vitamin B12 deficiency, HLD, HTN, Vitamin D deficiency.   MEDICAL HISTORY:  Past Medical History:  Diagnosis Date  . B12 deficiency   . Hyperlipidemia   .  Hypertension   . Obesity   . Prediabetes   . Vitamin D deficiency     SURGICAL HISTORY: Past Surgical History:  Procedure Laterality Date  . APPENDECTOMY    . BREAST BIOPSY Right 1975  . INGUINAL HERNIA REPAIR Bilateral age 82    SOCIAL HISTORY: Social History   Socioeconomic History  . Marital status: Married    Spouse name: Not on file  . Number of children: Not on file  . Years of education: Not on file  . Highest education level: Not on file  Occupational History  . Not on file  Tobacco Use  . Smoking status: Never Smoker  . Smokeless tobacco: Never Used  Substance and Sexual Activity  . Alcohol use: Yes    Alcohol/week: 2.0 standard drinks    Types: 2 Standard drinks or equivalent per week    Comment: wine  . Drug use: No  . Sexual activity: Not on file  Other Topics Concern  . Not on file  Social History Narrative  . Not on file   Social Determinants of Health   Financial Resource Strain:   . Difficulty of Paying Living Expenses:   Food Insecurity:   . Worried About Charity fundraiser in the Last Year:   . Arboriculturist in the Last Year:   Transportation Needs:   . Film/video editor (Medical):   Marland Kitchen Lack of Transportation (Non-Medical):   Physical Activity:   .  Days of Exercise per Week:   . Minutes of Exercise per Session:   Stress:   . Feeling of Stress :   Social Connections:   . Frequency of Communication with Friends and Family:   . Frequency of Social Gatherings with Friends and Family:   . Attends Religious Services:   . Active Member of Clubs or Organizations:   . Attends Archivist Meetings:   Marland Kitchen Marital Status:   Intimate Partner Violence:   . Fear of Current or Ex-Partner:   . Emotionally Abused:   Marland Kitchen Physically Abused:   . Sexually Abused:     FAMILY HISTORY: Family History  Problem Relation Age of Onset  . Hypertension Mother   . Alzheimer's disease Mother   . Hypertension Father   . Alzheimer's disease Father      ALLERGIES:  is allergic to lipitor [atorvastatin] and wellbutrin [bupropion].  MEDICATIONS:  Current Outpatient Medications  Medication Sig Dispense Refill  . atenolol (TENORMIN) 100 MG tablet Take 1 tablet Daily for BP 90 tablet 3  . Cholecalciferol (VITAMIN D PO) Take 5,000 Units by mouth daily.     Marland Kitchen escitalopram (LEXAPRO) 20 MG tablet TAKE 1 TABLET BY MOUTH DAILY. 90 tablet 4  . gabapentin (NEURONTIN) 100 MG capsule Take 1-3 capsules (100-300 mg total) by mouth 3 (three) times daily. 90 capsule 2  . montelukast (SINGULAIR) 10 MG tablet TAKE 1 TABLET BY MOUTH DAILY FOR ALLERGIES 90 tablet 3  . rosuvastatin (CRESTOR) 20 MG tablet Take 1 tablet Daily for Cholesterol 90 tablet 3  . alendronate (FOSAMAX) 70 MG tablet Take 70 mg by mouth once a week. Take with a full glass of water on an empty stomach.    . phentermine (ADIPEX-P) 37.5 MG tablet Take 1/2 to 1 tablet every Morning for Dieting & Weight Loss (Patient not taking: Reported on 11/09/2019) 90 tablet 1  . topiramate (TOPAMAX) 50 MG tablet Take 1/2 to 1 tablet 2 x /day at Suppertime & Bedtime for Dieting & Weight Loss (Patient not taking: Reported on 11/03/2019) 180 tablet 1   No current facility-administered medications for this visit.    REVIEW OF SYSTEMS:    10 Point review of Systems was done is negative except as noted above.  PHYSICAL EXAMINATION: ECOG PERFORMANCE STATUS: 1 - Symptomatic but completely ambulatory  . Vitals:   11/09/19 1255  BP: (!) 162/83  Pulse: 88  Resp: 18  Temp: 98.1 F (36.7 C)  SpO2: 97%   Filed Weights   11/09/19 1255  Weight: 239 lb 8 oz (108.6 kg)   .Body mass index is 43.81 kg/m.  GENERAL:alert, in no acute distress and comfortable SKIN: no acute rashes, no significant lesions EYES: conjunctiva are pink and non-injected, sclera anicteric OROPHARYNX: MMM, no exudates, no oropharyngeal erythema or ulceration NECK: supple, no JVD LYMPH:  no palpable lymphadenopathy in the cervical,  axillary or inguinal regions LUNGS: clear to auscultation b/l with normal respiratory effort HEART: regular rate & rhythm ABDOMEN:  normoactive bowel sounds , non tender, not distended. Extremity: no pedal edema PSYCH: alert & oriented x 3 with fluent speech NEURO: no focal motor/sensory deficits  LABORATORY DATA:  I have reviewed the data as listed  . CBC Latest Ref Rng & Units 11/09/2019 11/03/2019 07/28/2019  WBC 4.0 - 10.5 K/uL 10.5 7.9 9.2  Hemoglobin 12.0 - 15.0 g/dL 8.7(L) 8.2(L) 10.0(L)  Hematocrit 36.0 - 46.0 % 30.2(L) 27.7(L) 33.5(L)  Platelets 150 - 400 K/uL 304 267 263    .  CMP Latest Ref Rng & Units 11/09/2019 11/03/2019 07/28/2019  Glucose 70 - 99 mg/dL 115(H) 97 90  BUN 8 - 23 mg/dL 15 16 17   Creatinine 0.44 - 1.00 mg/dL 0.77 0.68 0.69  Sodium 135 - 145 mmol/L 142 141 142  Potassium 3.5 - 5.1 mmol/L 3.9 4.6 4.6  Chloride 98 - 111 mmol/L 104 106 104  CO2 22 - 32 mmol/L 25 30 30   Calcium 8.9 - 10.3 mg/dL 9.8 9.6 9.7  Total Protein 6.5 - 8.1 g/dL 7.3 6.4 6.9  Total Bilirubin 0.3 - 1.2 mg/dL 0.2(L) 0.3 0.3  Alkaline Phos 38 - 126 U/L 87 - -  AST 15 - 41 U/L 14(L) 35 15  ALT 0 - 44 U/L 19 43(H) 10     RADIOGRAPHIC STUDIES: I have personally reviewed the radiological images as listed and agreed with the findings in the report. DG Chest 2 View  Result Date: 11/03/2019 CLINICAL DATA:  Dyspnea on exertion EXAM: CHEST - 2 VIEW COMPARISON:  10/01/2011 FINDINGS: Cardiac shadow is within normal limits. The lungs are well aerated without focal infiltrate or sizable effusion. Hiatal hernia is noted. Chronic compression deformity is noted in the lower thoracic spine. No other focal bony abnormality is noted. IMPRESSION: Moderate-sized hiatal hernia.  No other focal abnormality is noted. Electronically Signed   By: Inez Catalina M.D.   On: 11/03/2019 20:28    ASSESSMENT & PLAN:   64 yo female with   1) Iron deficiency Anemia PLAN: -Discussed patient's most recent labs from  11/03/2019, all values are WNL except for RBC at 3.49, Hgb at 8.2, HCT at 27.7, MCV at 79.4, MCH at 23.5, MCHC at 29.6, RDW at 18.7, ALT at 43. 11/09/2019 Iron Panel is as follows: Iron at 22, TIBC at 526, Sat Ratios at 4, Ferritin at 5 -Advised pt that iron deficiency could be caused by blood loss or absorption issues -Will check for other nutritional deficiencies and antibodies r/o pernicious anemia  -Advised pt that it would take several months for PO iron to replace iron deficiency, longer if ongoing blood loss -Advised pt that IV Iron does carry a risk of an allergic reaction. Less with newer preparations.  -Advised pt that Iron Polysaccharide is often tolerated better than Ferrous Sulfate  -Recommend OTC B-complex vitamin  -Will get labs today  -Will IV Injectafer weekly x2  -Will see back in 3 months with labs    FOLLOW UP: Labs today IV Injectafer weekly x 2 doses RTC with Dr Irene Limbo with labs in 3 months   All of the patients questions were answered with apparent satisfaction. The patient knows to call the clinic with any problems, questions or concerns.  I spent 35 mins counseling the patient face to face. The total time spent in the appointment was 45 minutes and more than 50% was on counseling and direct patient cares.    Sullivan Lone MD [place] AAHIVMS [facility] Hematology/Oncology Physician [facility]  (Office):       [phone] (Work cell):  [phone] (Fax):           [phone]  11/09/2019 5:45 PM   I, Yevette Edwards, am acting as a scribe for Dr. Sullivan Lone.   .I have reviewed the above documentation for accuracy and completeness, and I agree with the above. Brunetta Genera MD

## 2019-11-09 NOTE — Telephone Encounter (Signed)
Scheduled appt per 6/9 los.  Printed calendar and avs.

## 2019-11-10 LAB — INTRINSIC FACTOR ANTIBODIES: Intrinsic Factor: 0.9 AU/mL (ref 0.0–1.1)

## 2019-11-10 LAB — ANTI-PARIETAL ANTIBODY: Parietal Cell Antibody-IgG: 1.5 Units (ref 0.0–20.0)

## 2019-11-16 ENCOUNTER — Other Ambulatory Visit: Payer: Self-pay

## 2019-11-16 ENCOUNTER — Inpatient Hospital Stay: Payer: BC Managed Care – PPO

## 2019-11-16 VITALS — BP 124/50 | HR 58 | Temp 98.3°F | Resp 18

## 2019-11-16 DIAGNOSIS — D509 Iron deficiency anemia, unspecified: Secondary | ICD-10-CM | POA: Diagnosis not present

## 2019-11-16 DIAGNOSIS — D649 Anemia, unspecified: Secondary | ICD-10-CM

## 2019-11-16 MED ORDER — SODIUM CHLORIDE 0.9 % IV SOLN
Freq: Once | INTRAVENOUS | Status: AC
  Filled 2019-11-16: qty 250

## 2019-11-16 MED ORDER — ACETAMINOPHEN 325 MG PO TABS
ORAL_TABLET | ORAL | Status: AC
  Filled 2019-11-16: qty 2

## 2019-11-16 MED ORDER — ACETAMINOPHEN 325 MG PO TABS
650.0000 mg | ORAL_TABLET | Freq: Once | ORAL | Status: AC
  Administered 2019-11-16: 650 mg via ORAL

## 2019-11-16 MED ORDER — LORATADINE 10 MG PO TABS
ORAL_TABLET | ORAL | Status: AC
  Filled 2019-11-16: qty 1

## 2019-11-16 MED ORDER — LORATADINE 10 MG PO TABS
10.0000 mg | ORAL_TABLET | Freq: Once | ORAL | Status: AC
  Administered 2019-11-16: 10 mg via ORAL

## 2019-11-16 MED ORDER — SODIUM CHLORIDE 0.9 % IV SOLN
750.0000 mg | Freq: Once | INTRAVENOUS | Status: AC
  Administered 2019-11-16: 750 mg via INTRAVENOUS
  Filled 2019-11-16: qty 15

## 2019-11-16 NOTE — Patient Instructions (Signed)

## 2019-11-23 ENCOUNTER — Other Ambulatory Visit: Payer: Self-pay

## 2019-11-23 ENCOUNTER — Inpatient Hospital Stay: Payer: BC Managed Care – PPO

## 2019-11-23 VITALS — BP 112/62 | HR 57 | Temp 98.6°F | Resp 18

## 2019-11-23 DIAGNOSIS — D649 Anemia, unspecified: Secondary | ICD-10-CM

## 2019-11-23 DIAGNOSIS — D509 Iron deficiency anemia, unspecified: Secondary | ICD-10-CM

## 2019-11-23 MED ORDER — LORATADINE 10 MG PO TABS
ORAL_TABLET | ORAL | Status: AC
  Filled 2019-11-23: qty 1

## 2019-11-23 MED ORDER — ACETAMINOPHEN 325 MG PO TABS
ORAL_TABLET | ORAL | Status: AC
  Filled 2019-11-23: qty 1

## 2019-11-23 MED ORDER — SODIUM CHLORIDE 0.9 % IV SOLN
750.0000 mg | Freq: Once | INTRAVENOUS | Status: AC
  Administered 2019-11-23: 750 mg via INTRAVENOUS
  Filled 2019-11-23: qty 15

## 2019-11-23 MED ORDER — SODIUM CHLORIDE 0.9 % IV SOLN
Freq: Once | INTRAVENOUS | Status: AC
  Filled 2019-11-23: qty 250

## 2019-11-23 MED ORDER — LORATADINE 10 MG PO TABS
10.0000 mg | ORAL_TABLET | Freq: Once | ORAL | Status: AC
  Administered 2019-11-23: 10 mg via ORAL

## 2019-11-23 MED ORDER — ACETAMINOPHEN 325 MG PO TABS
650.0000 mg | ORAL_TABLET | Freq: Once | ORAL | Status: AC
  Administered 2019-11-23: 650 mg via ORAL

## 2019-11-23 NOTE — Patient Instructions (Signed)

## 2019-12-06 ENCOUNTER — Other Ambulatory Visit: Payer: Self-pay

## 2019-12-06 ENCOUNTER — Other Ambulatory Visit: Payer: BC Managed Care – PPO

## 2019-12-06 DIAGNOSIS — R7989 Other specified abnormal findings of blood chemistry: Secondary | ICD-10-CM

## 2019-12-06 LAB — HEPATIC FUNCTION PANEL
AG Ratio: 1.8 (calc) (ref 1.0–2.5)
ALT: 11 U/L (ref 6–29)
AST: 14 U/L (ref 10–35)
Albumin: 4.2 g/dL (ref 3.6–5.1)
Alkaline phosphatase (APISO): 69 U/L (ref 37–153)
Bilirubin, Direct: 0.1 mg/dL (ref 0.0–0.2)
Globulin: 2.3 g/dL (calc) (ref 1.9–3.7)
Indirect Bilirubin: 0.3 mg/dL (calc) (ref 0.2–1.2)
Total Bilirubin: 0.4 mg/dL (ref 0.2–1.2)
Total Protein: 6.5 g/dL (ref 6.1–8.1)

## 2020-01-27 NOTE — Progress Notes (Signed)
FOLLOW UP  Assessment and Plan:   Hypertension Well controlled with current medications  Monitor blood pressure at home; patient to call if consistently greater than 130/80 Continue DASH diet.   Reminder to go to the ER if any CP, SOB, nausea, dizziness, severe HA, changes vision/speech, left arm numbness and tingling and jaw pain.  Cholesterol Currently at goal; continue rosuvastatin   Continue low cholesterol diet and exercise.  Check lipid panel.   Other abnormal glucose (prediabetes) Discussed diet/exercise, weight management  Check A1C q70m  Morbid Obesity with co morbidities Long discussion about weight loss, diet, and exercise Recommended diet heavy in fruits and veggies and low in animal meats, cheeses, and dairy products, appropriate calorie intake Discussed ideal weight for height and initial weight goal (230lb) Patient will work on focusing on fruit and veggie intake (stretegies discussed) and lean protein intake, continue to limit sweets, exercise plan for indoors PUSH water intake Patient has taken phentermine with benefit; has been taking drug break; restart Will follow up in 3 months  Vitamin D Def Below goal at last visit; she has not changed her dose  continue supplementation to maintain goal of 60-100 Defer Vit D level  Anxiety Well managed by current regimen; continue medications due to pandemic, but attempt taper next year, improved since retirement Stress management techniques discussed, increase water, good sleep hygiene discussed, increase exercise, and increase veggies.   Sx anemia; iron def anemia Had unremarkable GI workup in 2019; recurrent iron def anemia, poorly tolerant of oral supplement Dr. Irene Limbo following, OV in 2 weeks   Continue diet and meds as discussed. Further disposition pending results of labs. Discussed med's effects and SE's.   Over 30 minutes of exam, counseling, chart review, and critical decision making was performed.    Future Appointments  Date Time Provider [place]  02/09/2020  9:45 AM [facility] None  02/09/2020 10:20 AM Brunetta Genera, MD [facility] None  08/14/2020  2:00 PM Unk Pinto, MD [facility] None    ----------------------------------------------------------------------------------------------------------------------  HPI 64 y.o. female  presents for 3 month follow up on hypertension, cholesterol, prediabetes, morbid obesity and vitamin D deficiency.   She was prescribed fosamax weekly for osteoporosis since 06/2018 via GYN, admits hasn't been taking, forgets to take.   She is currently taking lexapro 20 mg daily that was started for work related stress/anxiety several years ago, doing much better since retiring in July 2019. Thinking about tapering off next year if doing well after the holidays.    she is prescribed phentermine for weight loss, taking a break for last several months. While on the medication they have lost 0 lbs since last visit. They deny palpitations, anxiety, trouble sleeping, elevated BP. Didn't see benefit with topamax.   BMI is Body mass index is 44.45 kg/m., she is working on diet, admits minimal exercise due to heat. Admits hasn't been eating as well as she should in the last 6 months, but since last visit has cut back on sweets, trying not to buy.  She is fairly ambivalent about making aggressive changes Wt Readings from Last 3 Encounters:  01/30/20 243 lb (110.2 kg)  11/09/19 239 lb 8 oz (108.6 kg)  11/03/19 241 lb (109.3 kg)   Exercise: some walking with dog - however dyspnea with minimal exertion Water intake: admits not as good as she should be - can't quantify, has increased, minimal soda  Her blood pressure has been controlled at home, today their BP is BP: 124/80  She does not workout. She denies chest pain, dizziness, dyspnea.    She is on cholesterol medication Rosuvastatin 20 mg every day and denies myalgias. Her  cholesterol is not at goal. The cholesterol last visit was:   Lab Results  Component Value Date   CHOL 151 11/03/2019   HDL 44 (L) 11/03/2019   LDLCALC 76 11/03/2019   TRIG 214 (H) 11/03/2019   CHOLHDL 3.4 11/03/2019    She has been working on diet prediabetes, and denies increased appetite, nausea, paresthesia of the feet, polydipsia, polyuria and visual disturbances. Last A1C in the office was:  Lab Results  Component Value Date   HGBA1C 5.9 (H) 07/28/2019   Patient is on Vitamin D supplement, taking 5000 IU daily  Lab Results  Component Value Date   VD25OH 46 07/28/2019     She has anemia; iron deficiency, had negative hemoccult x 3 in April 2021, was on iron supplement but stopped due to GI SE, Had negatvie EGD and colonosocpy with Dr. Earlean Shawl in Nov 2019 for anemia workup. Was referred to Dr. Irene Limbo and was underwent IV injectafer weekly x 2, recommended iron polysaccharide and B complex, took for short period but now doing multivitamin with iron and B vitamins.  Lab Results  Component Value Date   WBC 10.5 11/09/2019   HGB 8.7 (L) 11/09/2019   HCT 30.2 (L) 11/09/2019   MCV 81.4 11/09/2019   PLT 304 11/09/2019   Lab Results  Component Value Date   IRON 21 (L) 11/09/2019   TIBC 559 (H) 11/09/2019   FERRITIN <4 (L) 11/09/2019   Lab Results  Component Value Date   [id number] 11/09/2019     Current Medications:  Current Outpatient Medications on File Prior to Visit  Medication Sig  . atenolol (TENORMIN) 100 MG tablet Take 1 tablet Daily for BP  . Cholecalciferol (VITAMIN D PO) Take 5,000 Units by mouth daily.   Marland Kitchen escitalopram (LEXAPRO) 20 MG tablet TAKE 1 TABLET BY MOUTH DAILY.  . montelukast (SINGULAIR) 10 MG tablet TAKE 1 TABLET BY MOUTH DAILY FOR ALLERGIES  . rosuvastatin (CRESTOR) 20 MG tablet Take 1 tablet Daily for Cholesterol  . phentermine (ADIPEX-P) 37.5 MG tablet Take 1/2 to 1 tablet every Morning for Dieting & Weight Loss (Patient not taking: Reported on  11/09/2019)   No current facility-administered medications on file prior to visit.     Allergies:  Allergies  Allergen Reactions  . Lipitor [Atorvastatin]   . Wellbutrin [Bupropion]     dysphoria     Medical History:  Past Medical History:  Diagnosis Date  . B12 deficiency   . Hyperlipidemia   . Hypertension   . Obesity   . Prediabetes   . Vitamin D deficiency    Family history- Reviewed and unchanged Social history- Reviewed and unchanged   Review of Systems:  Review of Systems  Constitutional: Negative for chills, fever, malaise/fatigue and weight loss.  HENT: Negative for hearing loss, sore throat and tinnitus.   Eyes: Negative for blurred vision and double vision.  Respiratory: Negative for cough, shortness of breath and wheezing.   Cardiovascular: Negative for chest pain, palpitations, orthopnea, claudication and leg swelling.  Gastrointestinal: Negative for abdominal pain, blood in stool, constipation, diarrhea, heartburn, melena, nausea and vomiting.  Genitourinary: Negative.  Negative for hematuria.  Musculoskeletal: Negative for falls, joint pain and myalgias.  Skin: Negative for rash.  Neurological: Negative for dizziness, tingling, sensory change, weakness and headaches.  Endo/Heme/Allergies: Negative for polydipsia.  Psychiatric/Behavioral: Negative.  Negative for depression and substance abuse. The patient is not nervous/anxious and does not have insomnia.   All other systems reviewed and are negative.    Physical Exam: BP 124/80   Pulse 61   Temp (!) 96.3 F (35.7 C)   Ht 5\' 2"  (1.575 m)   Wt 243 lb (110.2 kg)   SpO2 97%   BMI 44.45 kg/m  Wt Readings from Last 3 Encounters:  01/30/20 243 lb (110.2 kg)  11/09/19 239 lb 8 oz (108.6 kg)  11/03/19 241 lb (109.3 kg)   General Appearance: Well nourished, morbidly obese female, in no apparent distress. Eyes: PERRLA, EOMs, conjunctiva no swelling or erythema Sinuses: No Frontal/maxillary  tenderness ENT/Mouth: Ext aud canals clear, TMs without erythema, bulging. No erythema, swelling, or exudate on post pharynx.  Tonsils not swollen or erythematous. Hearing normal.  Neck: Supple, thyroid normal.  Respiratory: Respiratory effort normal, BS equal bilaterally without rales, rhonchi, wheezing or stridor.  Cardio: RRR with 2/6 systolic murmur, no radiation (per patient not new, has seen cardiology remotely, no recommendations). Intact symmetrical peripheral pulses without edema.  Abdomen: Soft, obese abdomen + BS.  Non tender, no guarding, rebound, hernias, masses. Lymphatics: Non tender without lymphadenopathy.  Musculoskeletal: Full ROM, 5/5 strength, Normal gait Skin: Warm, dry without rashes, lesions, ecchymosis.  Neuro: Cranial nerves intact. No cerebellar symptoms.  Psych: Awake and oriented X 3, normal affect, Insight and Judgment appropriate.     Izora Ribas, NP 11:51 AM [facility]

## 2020-01-30 ENCOUNTER — Ambulatory Visit (INDEPENDENT_AMBULATORY_CARE_PROVIDER_SITE_OTHER): Payer: BC Managed Care – PPO | Admitting: Adult Health

## 2020-01-30 ENCOUNTER — Encounter: Payer: Self-pay | Admitting: Adult Health

## 2020-01-30 ENCOUNTER — Other Ambulatory Visit: Payer: Self-pay

## 2020-01-30 VITALS — BP 124/80 | HR 61 | Temp 96.3°F | Ht 62.0 in | Wt 243.0 lb

## 2020-01-30 DIAGNOSIS — F419 Anxiety disorder, unspecified: Secondary | ICD-10-CM | POA: Diagnosis not present

## 2020-01-30 DIAGNOSIS — I10 Essential (primary) hypertension: Secondary | ICD-10-CM | POA: Diagnosis not present

## 2020-01-30 DIAGNOSIS — R7309 Other abnormal glucose: Secondary | ICD-10-CM | POA: Diagnosis not present

## 2020-01-30 DIAGNOSIS — Z79899 Other long term (current) drug therapy: Secondary | ICD-10-CM | POA: Diagnosis not present

## 2020-01-30 DIAGNOSIS — E559 Vitamin D deficiency, unspecified: Secondary | ICD-10-CM

## 2020-01-30 DIAGNOSIS — E782 Mixed hyperlipidemia: Secondary | ICD-10-CM | POA: Diagnosis not present

## 2020-01-30 DIAGNOSIS — D509 Iron deficiency anemia, unspecified: Secondary | ICD-10-CM

## 2020-01-30 DIAGNOSIS — E538 Deficiency of other specified B group vitamins: Secondary | ICD-10-CM

## 2020-01-30 DIAGNOSIS — M3500 Sicca syndrome, unspecified: Secondary | ICD-10-CM

## 2020-01-30 DIAGNOSIS — D649 Anemia, unspecified: Secondary | ICD-10-CM

## 2020-01-30 MED ORDER — ALENDRONATE SODIUM 70 MG PO TABS: 70.0000 mg | ORAL_TABLET | ORAL | 1 refills | Status: DC

## 2020-01-30 NOTE — Patient Instructions (Signed)
Goals    . DIET - INCREASE WATER INTAKE     65+ fluid ounces of water or unsweet/clear liquids    . Exercise 3x per week (20-30 min per time)    . Weight (lb) < 230 lb (104.3 kg)      Set an alarm on your phone to remind for weekly fosamax  Perhaps discuss daily dose vs injections such as prolia with Dr. Radene Knee next visit  Recommend weighing weekly to keep a log  Try adding a glass of water daily in the morning when you wake up - I like doing hot water with lemon and stevia   Try to think about high fiber easy meals -   I like bean and veggie soups - can cook in slow cooker Or bean and [place] chilli  1 sheet or 1 pot meals on pinterest  Try to track steps, or think about 20-30 min indoor exercise that you could do - video program, dancing, etc that wouldn't be disrupted by weather  EXERCISE in 50s, 60s and 70s is one of the BIGGEST predictors of long term outcome - helps maintain mobility, improve cardiovascular health, reduce cancer risks, MAINTAIN MEMORY    High-Fiber Diet Fiber, also called dietary fiber, is a type of carbohydrate that is found in fruits, vegetables, whole grains, and beans. A high-fiber diet can have many health benefits. Your health care provider may recommend a high-fiber diet to help:  Prevent constipation. Fiber can make your bowel movements more regular.  Lower your cholesterol.  Relieve the following conditions: ? Swelling of veins in the anus (hemorrhoids). ? Swelling and irritation (inflammation) of specific areas of the digestive tract (uncomplicated diverticulosis). ? A problem of the large intestine (colon) that sometimes causes pain and diarrhea (irritable bowel syndrome, IBS).  Prevent overeating as part of a weight-loss plan.  Prevent heart disease, type 2 diabetes, and certain cancers. What is my plan? The recommended daily fiber intake in grams (g) includes:  38 g for men age 43 or younger.  30 g for men over age 62.  74 g for  women age 27 or younger.  21 g for women over age 71. You can get the recommended daily intake of dietary fiber by:  Eating a variety of fruits, vegetables, grains, and beans.  Taking a fiber supplement, if it is not possible to get enough fiber through your diet. What do I need to know about a high-fiber diet?  It is better to get fiber through food sources rather than from fiber supplements. There is not a lot of research about how effective supplements are.  Always check the fiber content on the nutrition facts label of any prepackaged food. Look for foods that contain 5 g of fiber or more per serving.  Talk with a diet and nutrition specialist (dietitian) if you have questions about specific foods that are recommended or not recommended for your medical condition, especially if those foods are not listed below.  Gradually increase how much fiber you consume. If you increase your intake of dietary fiber too quickly, you may have bloating, cramping, or gas.  Drink plenty of water. Water helps you to digest fiber. What are tips for following this plan?  Eat a wide variety of high-fiber foods.  Make sure that half of the grains that you eat each day are whole grains.  Eat breads and cereals that are made with whole-grain flour instead of refined flour or white flour.  Eat brown  rice, bulgur wheat, or millet instead of white rice.  Start the day with a breakfast that is high in fiber, such as a cereal that contains 5 g of fiber or more per serving.  Use beans in place of meat in soups, salads, and pasta dishes.  Eat high-fiber snacks, such as berries, raw vegetables, nuts, and popcorn.  Choose whole fruits and vegetables instead of processed forms like juice or sauce. What foods can I eat?  Fruits Berries. Pears. Apples. Oranges. Avocado. Prunes and raisins. Dried figs. Vegetables Sweet potatoes. Spinach. Kale. Artichokes. Cabbage. Broccoli. Cauliflower. Green peas. Carrots.  Squash. Grains Whole-grain breads. Multigrain cereal. Oats and oatmeal. Brown rice. Barley. Bulgur wheat. [place]. Quinoa. Bran muffins. Popcorn. Rye wafer crackers. Meats and other proteins Navy, kidney, and pinto beans. Soybeans. Split peas. Lentils. Nuts and seeds. Dairy Fiber-fortified yogurt. Beverages Fiber-fortified soy milk. Fiber-fortified orange juice. Other foods Fiber bars. The items listed above may not be a complete list of recommended foods and beverages. Contact a dietitian for more options. What foods are not recommended? Fruits Fruit juice. Cooked, strained fruit. Vegetables Fried potatoes. Canned vegetables. Well-cooked vegetables. Grains White bread. Pasta made with refined flour. White rice. Meats and other proteins Fatty cuts of meat. Fried chicken or fried fish. Dairy Milk. Yogurt. Cream cheese. Sour cream. Fats and oils Butters. Beverages Soft drinks. Other foods Cakes and pastries. The items listed above may not be a complete list of foods and beverages to avoid. Contact a dietitian for more information. Summary  Fiber is a type of carbohydrate. It is found in fruits, vegetables, whole grains, and beans.  There are many health benefits of eating a high-fiber diet, such as preventing constipation, lowering blood cholesterol, helping with weight loss, and reducing your risk of heart disease, diabetes, and certain cancers.  Gradually increase your intake of fiber. Increasing too fast can result in cramping, bloating, and gas. Drink plenty of water while you increase your fiber.  The best sources of fiber include whole fruits and vegetables, whole grains, nuts, seeds, and beans. This information is not intended to replace advice given to you by your health care provider. Make sure you discuss any questions you have with your health care provider. Document Revised: 03/23/2017 Document Reviewed: 03/23/2017 Elsevier Patient Education  2020 [employer].

## 2020-01-31 LAB — MAGNESIUM: Magnesium: 2.2 mg/dL (ref 1.5–2.5)

## 2020-01-31 LAB — CBC WITH DIFFERENTIAL/PLATELET
Absolute Monocytes: 483 cells/uL (ref 200–950)
Basophils Absolute: 70 cells/uL (ref 0–200)
Basophils Relative: 1 %
Eosinophils Absolute: 238 cells/uL (ref 15–500)
Eosinophils Relative: 3.4 %
HCT: 40 % (ref 35.0–45.0)
Hemoglobin: 12.5 g/dL (ref 11.7–15.5)
Lymphs Abs: 1939 cells/uL (ref 850–3900)
MCH: 27.6 pg (ref 27.0–33.0)
MCHC: 31.3 g/dL — ABNORMAL LOW (ref 32.0–36.0)
MCV: 88.3 fL (ref 80.0–100.0)
MPV: 11.4 fL (ref 7.5–12.5)
Monocytes Relative: 6.9 %
Neutro Abs: 4270 cells/uL (ref 1500–7800)
Neutrophils Relative %: 61 %
Platelets: 216 10*3/uL (ref 140–400)
RBC: 4.53 10*6/uL (ref 3.80–5.10)
RDW: 15.8 % — ABNORMAL HIGH (ref 11.0–15.0)
Total Lymphocyte: 27.7 %
WBC: 7 10*3/uL (ref 3.8–10.8)

## 2020-01-31 LAB — COMPLETE METABOLIC PANEL WITH GFR
AG Ratio: 1.9 (calc) (ref 1.0–2.5)
ALT: 12 U/L (ref 6–29)
AST: 16 U/L (ref 10–35)
Albumin: 4.4 g/dL (ref 3.6–5.1)
Alkaline phosphatase (APISO): 69 U/L (ref 37–153)
BUN: 13 mg/dL (ref 7–25)
CO2: 32 mmol/L (ref 20–32)
Calcium: 9.8 mg/dL (ref 8.6–10.4)
Chloride: 102 mmol/L (ref 98–110)
Creat: 0.71 mg/dL (ref 0.50–0.99)
GFR, Est African American: 104 mL/min/{1.73_m2} (ref >=60)
GFR, Est Non African American: 90 mL/min/{1.73_m2} (ref >=60)
Globulin: 2.3 g/dL (calc) (ref 1.9–3.7)
Glucose, Bld: 92 mg/dL (ref 65–99)
Potassium: 4.7 mmol/L (ref 3.5–5.3)
Sodium: 141 mmol/L (ref 135–146)
Total Bilirubin: 0.3 mg/dL (ref 0.2–1.2)
Total Protein: 6.7 g/dL (ref 6.1–8.1)

## 2020-01-31 LAB — LIPID PANEL
Cholesterol: 215 mg/dL — ABNORMAL HIGH (ref <200)
HDL: 44 mg/dL — ABNORMAL LOW (ref >=50)
LDL Cholesterol (Calc): 132 mg/dL (calc) — ABNORMAL HIGH
Non-HDL Cholesterol (Calc): 171 mg/dL (calc) — ABNORMAL HIGH (ref <130)
Total CHOL/HDL Ratio: 4.9 (calc) (ref <5.0)
Triglycerides: 239 mg/dL — ABNORMAL HIGH (ref <150)

## 2020-01-31 LAB — TSH: TSH: 2.92 mIU/L (ref 0.40–4.50)

## 2020-01-31 LAB — HEMOGLOBIN A1C
Hgb A1c MFr Bld: 5.5 % of total Hgb (ref <5.7)
Mean Plasma Glucose: 111 (calc)
eAG (mmol/L): 6.2 (calc)

## 2020-02-08 NOTE — Progress Notes (Signed)
HEMATOLOGY/ONCOLOGY CONSULTATION NOTE  Date of Service: 02/09/2020  Patient Care Team: Unk Pinto, MD as PCP - General (Internal Medicine)  CHIEF COMPLAINTS/PURPOSE OF CONSULTATION:  IDA  HISTORY OF PRESENTING ILLNESS:   Whitney Herrera is a wonderful 64 y.o. female who has been referred to [place] by Dr. Melford Aase for evaluation and management of iron deficiency anemia. The pt reports that she is doing well overall.  The pt reports that she was told that she was iron deficient about a year ago. During this time she had a Colonoscopy, EGD, and stool sample testing with Dr. Earlean Shawl who found nothing of concern. She was then placed on PO Iron, which corrected her blood counts. Pt had some stomach upset with PO Iron, but it was not too bothersome. She repeated stool sample testing with Chancy Hurter, CMA in April which was negative. Pt has never required an IV Iron infusion.   She has no known food intolerances and has used acid suppressants occasionally. Pt has felt more SOB and fatigued over the last few months, but denies any abnormal cravings. She denies any abnormal/excessive bleeding. She feels leg restlessness as she lays down to sleep sometimes. Pt was diagnosed with Shingles last week, but was treated and her symptoms are resolving.   Most recent lab results (11/03/2019) of CBC is as follows: all values are WNL except for RBC at 3.49, Hgb at 8.2, HCT at 27.7, MCV at 79.4, MCH at 23.5, MCHC at 29.6, RDW at 18.7, ALT at 43. 11/09/2019 Iron Panel is as follows: Iron at 22, TIBC at 526, Sat Ratios at 4, Ferritin at 5  On review of systems, pt reports fatigue, SOB, restless legs and denies bloody/black stools, gum bleeds, nose bleeds, hematuria, abdominal pain, rashes, ice cravings  and any other symptoms.   On PMHx the pt reports Vitamin B12 deficiency, HLD, HTN, Vitamin D deficiency.  INTERVAL HISTORY: Whitney Herrera is a wonderful 64 y.o. female who is here for evaluation and  management of iron deficiency anemia. The patient's last visit with [place] was on 11/09/2019. The pt reports that she is doing well overall.  The pt reports that she has noticed improvement in her energy since her IV Iron infusion. She denies any abnormal bleeding. She has no history of Gluten intolerance or Kidney stones. Pt had her last Colonoscopy in 11/19 and is scheduled to repeat the procedure in 10 years. Pt had her last Hemoccult stool sample in April which was normal.   She has received her COVID19 vaccines.  Lab results today (02/09/20) of CBC w/diff and CMP is as follows: all values are WNL except for RDW at 15.9. 02/09/2020 Iron Panel is as follows: Iron at 53, TIBC at 418, Sat Ratios at 13, UIBC at 365 02/09/2020 Ferritin at 12  On review of systems, pt denies fatigue, nose bleeds, gum bleeds, vaginal bleeding, bloody/black stools, hematuria, unexpected weight loss and any other symptoms.   MEDICAL HISTORY:  Past Medical History:  Diagnosis Date  . B12 deficiency   . Hyperlipidemia   . Hypertension   . Obesity   . Prediabetes   . Vitamin D deficiency     SURGICAL HISTORY: Past Surgical History:  Procedure Laterality Date  . APPENDECTOMY    . BREAST BIOPSY Right 1975  . INGUINAL HERNIA REPAIR Bilateral age 31    SOCIAL HISTORY: Social History   Socioeconomic History  . Marital status: Married    Spouse name: Not on file  .  Number of children: Not on file  . Years of education: Not on file  . Highest education level: Not on file  Occupational History  . Not on file  Tobacco Use  . Smoking status: Never Smoker  . Smokeless tobacco: Never Used  Substance and Sexual Activity  . Alcohol use: Yes    Alcohol/week: 2.0 standard drinks    Types: 2 Standard drinks or equivalent per week    Comment: wine  . Drug use: No  . Sexual activity: Not on file  Other Topics Concern  . Not on file  Social History Narrative  . Not on file   Social Determinants of Health    Financial Resource Strain:   . Difficulty of Paying Living Expenses: Not on file  Food Insecurity:   . Worried About Charity fundraiser in the Last Year: Not on file  . Ran Out of Food in the Last Year: Not on file  Transportation Needs:   . Lack of Transportation (Medical): Not on file  . Lack of Transportation (Non-Medical): Not on file  Physical Activity:   . Days of Exercise per Week: Not on file  . Minutes of Exercise per Session: Not on file  Stress:   . Feeling of Stress : Not on file  Social Connections:   . Frequency of Communication with Friends and Family: Not on file  . Frequency of Social Gatherings with Friends and Family: Not on file  . Attends Religious Services: Not on file  . Active Member of Clubs or Organizations: Not on file  . Attends Archivist Meetings: Not on file  . Marital Status: Not on file  Intimate Partner Violence:   . Fear of Current or Ex-Partner: Not on file  . Emotionally Abused: Not on file  . Physically Abused: Not on file  . Sexually Abused: Not on file    FAMILY HISTORY: Family History  Problem Relation Age of Onset  . Hypertension Mother   . Alzheimer's disease Mother   . Hypertension Father   . Alzheimer's disease Father     ALLERGIES:  is allergic to lipitor [atorvastatin] and wellbutrin [bupropion].  MEDICATIONS:  Current Outpatient Medications  Medication Sig Dispense Refill  . alendronate (FOSAMAX) 70 MG tablet Take 1 tablet (70 mg total) by mouth once a week. Take with a full glass of water on an empty stomach. 12 tablet 1  . atenolol (TENORMIN) 100 MG tablet Take 1 tablet Daily for BP 90 tablet 3  . Cholecalciferol (VITAMIN D PO) Take 5,000 Units by mouth daily.     Marland Kitchen escitalopram (LEXAPRO) 20 MG tablet TAKE 1 TABLET BY MOUTH DAILY. 90 tablet 4  . iron polysaccharides (NIFEREX) 150 MG capsule Take 1 capsule (150 mg total) by mouth daily. 30 capsule 5  . montelukast (SINGULAIR) 10 MG tablet TAKE 1 TABLET BY  MOUTH DAILY FOR ALLERGIES 90 tablet 3  . Multiple Vitamins-Minerals (ONE DAILY MULTIVITAMIN WOMEN PO) Take 1 tablet by mouth daily. With iron and B vitamins    . phentermine (ADIPEX-P) 37.5 MG tablet Take 1/2 to 1 tablet every Morning for Dieting & Weight Loss (Patient not taking: Reported on 11/09/2019) 90 tablet 1  . rosuvastatin (CRESTOR) 20 MG tablet Take 1 tablet Daily for Cholesterol 90 tablet 3   No current facility-administered medications for this visit.    REVIEW OF SYSTEMS:   A 10+ POINT REVIEW OF SYSTEMS WAS OBTAINED including neurology, dermatology, psychiatry, cardiac, respiratory, lymph, extremities, GI, GU,  Musculoskeletal, constitutional, breasts, reproductive, HEENT.  All pertinent positives are noted in the HPI.  All others are negative.   PHYSICAL EXAMINATION: ECOG PERFORMANCE STATUS: 1 - Symptomatic but completely ambulatory  . Vitals:   02/09/20 1048  BP: (!) 115/52  Pulse: 65  Resp: 18  Temp: 98 F (36.7 C)  SpO2: 94%   Filed Weights   02/09/20 1048  Weight: 243 lb 6.4 oz (110.4 kg)   .Body mass index is 44.52 kg/m.   GENERAL:alert, in no acute distress and comfortable SKIN: no acute rashes, no significant lesions EYES: conjunctiva are pink and non-injected, sclera anicteric OROPHARYNX: MMM, no exudates, no oropharyngeal erythema or ulceration NECK: supple, no JVD LYMPH:  no palpable lymphadenopathy in the cervical, axillary or inguinal regions LUNGS: clear to auscultation b/l with normal respiratory effort HEART: regular rate & rhythm ABDOMEN:  normoactive bowel sounds , non tender, not distended. No palpable hepatosplenomegaly.  Extremity: no pedal edema PSYCH: alert & oriented x 3 with fluent speech NEURO: no focal motor/sensory deficits  LABORATORY DATA:  I have reviewed the data as listed  . CBC Latest Ref Rng & Units 02/09/2020 01/30/2020 11/09/2019  WBC 4.0 - 10.5 K/uL 7.4 7.0 10.5  Hemoglobin 12.0 - 15.0 g/dL 12.6 12.5 8.7(L)  Hematocrit 36 -  46 % 41.2 40.0 30.2(L)  Platelets 150 - 400 K/uL 188 216 304    . CMP Latest Ref Rng & Units 01/30/2020 12/06/2019 11/09/2019  Glucose 65 - 99 mg/dL 92 - 115(H)  BUN 7 - 25 mg/dL 13 - 15  Creatinine 0.50 - 0.99 mg/dL 0.71 - 0.77  Sodium 135 - 146 mmol/L 141 - 142  Potassium 3.5 - 5.3 mmol/L 4.7 - 3.9  Chloride 98 - 110 mmol/L 102 - 104  CO2 20 - 32 mmol/L 32 - 25  Calcium 8.6 - 10.4 mg/dL 9.8 - 9.8  Total Protein 6.1 - 8.1 g/dL 6.7 6.5 7.3  Total Bilirubin 0.2 - 1.2 mg/dL 0.3 0.4 0.2(L)  Alkaline Phos 38 - 126 U/L - - 87  AST 10 - 35 U/L 16 14 14(L)  ALT 6 - 29 U/L 12 11 19      RADIOGRAPHIC STUDIES: I have personally reviewed the radiological images as listed and agreed with the findings in the report. No results found.  ASSESSMENT & PLAN:   64 yo female with   1) Iron deficiency Anemia  PLAN: -Discussed pt labwork today, 02/09/20; anemia has resolved, Sat Ratios & Ferritin are still low.  -Advised pt that although her anemia is corrected, her iron stores are still low.  -Discussed replacing the rest of her Iron IV vs PO - Pt prefers to continue PO Iron.  -Advised pt that Iron Polysaccharide is typically tolerated better than other PO Iron.  -Advised pt that GI blood loss is the most common cause for blood loss in post-menopausal women.  -Advised pt that GI blood loss can be intermittent and may not always be observed on exams or tests.  -Advised pt that certain medications can affect PLT function. -Rx Iron Polysaccharide  -Will see back in 6 months with labs  -If labs are stable at next visit may discharge.    FOLLOW UP: RTC with Dr Irene Limbo with labs in 6 months   The total time spent in the appt was 20 minutes and more than 50% was on counseling and direct patient cares.  All of the patient's questions were answered with apparent satisfaction. The patient knows to call the clinic  with any problems, questions or concerns.    Sullivan Lone MD [place] [facility]IVMS [facility]  [facility] Hematology/Oncology Physician [facility]  (Office):       [phone] (Work cell):  [phone] (Fax):           [phone]  02/09/2020 11:36 AM  I, Yevette Edwards, am acting as a scribe for Dr. Sullivan Lone.   .I have reviewed the above documentation for accuracy and completeness, and I agree with the above. Brunetta Genera MD

## 2020-02-09 ENCOUNTER — Telehealth: Payer: Self-pay | Admitting: Hematology

## 2020-02-09 ENCOUNTER — Other Ambulatory Visit: Payer: Self-pay

## 2020-02-09 ENCOUNTER — Inpatient Hospital Stay: Payer: BC Managed Care – PPO | Attending: Hematology | Admitting: Hematology

## 2020-02-09 ENCOUNTER — Inpatient Hospital Stay: Payer: BC Managed Care – PPO

## 2020-02-09 VITALS — BP 115/52 | HR 65 | Temp 98.0°F | Resp 18 | Ht 62.0 in | Wt 243.4 lb

## 2020-02-09 DIAGNOSIS — D509 Iron deficiency anemia, unspecified: Secondary | ICD-10-CM | POA: Diagnosis present

## 2020-02-09 LAB — CBC WITH DIFFERENTIAL/PLATELET
Abs Immature Granulocytes: 0.02 10*3/uL (ref 0.00–0.07)
Basophils Absolute: 0.1 10*3/uL (ref 0.0–0.1)
Basophils Relative: 1 %
Eosinophils Absolute: 0.3 10*3/uL (ref 0.0–0.5)
Eosinophils Relative: 3 %
HCT: 41.2 % (ref 36.0–46.0)
Hemoglobin: 12.6 g/dL (ref 12.0–15.0)
Immature Granulocytes: 0 %
Lymphocytes Relative: 28 %
Lymphs Abs: 2.1 10*3/uL (ref 0.7–4.0)
MCH: 27.9 pg (ref 26.0–34.0)
MCHC: 30.6 g/dL (ref 30.0–36.0)
MCV: 91.2 fL (ref 80.0–100.0)
Monocytes Absolute: 0.5 10*3/uL (ref 0.1–1.0)
Monocytes Relative: 7 %
Neutro Abs: 4.6 10*3/uL (ref 1.7–7.7)
Neutrophils Relative %: 61 %
Platelets: 188 10*3/uL (ref 150–400)
RBC: 4.52 MIL/uL (ref 3.87–5.11)
RDW: 15.9 % — ABNORMAL HIGH (ref 11.5–15.5)
WBC: 7.4 10*3/uL (ref 4.0–10.5)
nRBC: 0 % (ref 0.0–0.2)

## 2020-02-09 LAB — IRON AND TIBC
Iron: 53 ug/dL (ref 41–142)
Saturation Ratios: 13 % — ABNORMAL LOW (ref 21–57)
TIBC: 418 ug/dL (ref 236–444)
UIBC: 365 ug/dL (ref 120–384)

## 2020-02-09 LAB — FERRITIN: Ferritin: 12 ng/mL (ref 11–307)

## 2020-02-09 MED ORDER — POLYSACCHARIDE IRON COMPLEX 150 MG PO CAPS: 150.0000 mg | ORAL_CAPSULE | Freq: Every day | ORAL | 5 refills | Status: DC

## 2020-02-09 NOTE — Telephone Encounter (Signed)
Scheduled per los. Declined printout  

## 2020-04-04 ENCOUNTER — Other Ambulatory Visit: Payer: Self-pay | Admitting: Internal Medicine

## 2020-04-04 DIAGNOSIS — R7303 Prediabetes: Secondary | ICD-10-CM

## 2020-07-11 ENCOUNTER — Other Ambulatory Visit: Payer: Self-pay | Admitting: Internal Medicine

## 2020-07-11 DIAGNOSIS — F419 Anxiety disorder, unspecified: Secondary | ICD-10-CM

## 2020-07-11 MED ORDER — ESCITALOPRAM OXALATE 20 MG PO TABS: ORAL_TABLET | ORAL | 0 refills | Status: DC

## 2020-07-28 ENCOUNTER — Other Ambulatory Visit: Payer: Self-pay | Admitting: Internal Medicine

## 2020-08-07 NOTE — Progress Notes (Signed)
HEMATOLOGY/ONCOLOGY CLINIC NOTE  Date of Service: 08/08/2020  Patient Care Team: Unk Pinto, MD as PCP - General (Internal Medicine)  CHIEF COMPLAINTS/PURPOSE OF CONSULTATION:  IDA  HISTORY OF PRESENTING ILLNESS:   Whitney Herrera is a wonderful 65 y.o. female who has been referred to [place] by Dr. Melford Aase for evaluation and management of iron deficiency anemia. The pt reports that she is doing well overall.  The pt reports that she was told that she was iron deficient about a year ago. During this time she had a Colonoscopy, EGD, and stool sample testing with Dr. Earlean Shawl who found nothing of concern. She was then placed on PO Iron, which corrected her blood counts. Pt had some stomach upset with PO Iron, but it was not too bothersome. She repeated stool sample testing with Chancy Hurter, CMA in April which was negative. Pt has never required an IV Iron infusion.   She has no known food intolerances and has used acid suppressants occasionally. Pt has felt more SOB and fatigued over the last few months, but denies any abnormal cravings. She denies any abnormal/excessive bleeding. She feels leg restlessness as she lays down to sleep sometimes. Pt was diagnosed with Shingles last week, but was treated and her symptoms are resolving.   Most recent lab results (11/03/2019) of CBC is as follows: all values are WNL except for RBC at 3.49, Hgb at 8.2, HCT at 27.7, MCV at 79.4, MCH at 23.5, MCHC at 29.6, RDW at 18.7, ALT at 43. 11/09/2019 Iron Panel is as follows: Iron at 22, TIBC at 526, Sat Ratios at 4, Ferritin at 5  On review of systems, pt reports fatigue, SOB, restless legs and denies bloody/black stools, gum bleeds, nose bleeds, hematuria, abdominal pain, rashes, ice cravings  and any other symptoms.   On PMHx the pt reports Vitamin B12 deficiency, HLD, HTN, Vitamin D deficiency.  INTERVAL HISTORY: Whitney Herrera is a wonderful 65 y.o. female who is here for evaluation and management of  iron deficiency anemia. The patient's last visit with [place] was on 02/09/2020. The pt reports that she is doing well overall.  The pt reports no new concerns. She notes her energy levels fluctuate. The pt notes no issues tolerating the Ferrous Sulfate once daily. The pt denies being on any restrictive diet. The pt notes that over the last year her face and neck have started to sweat heavily following minor activity.   Lab results today 08/08/2020 of CBC w/diff and CMP is as follows: all values are WNL except for Hgb of 11.4, MCH of 24.3, MCHC of 29.4, RDW of 15.9, Glucose of 103, AST of 13. 08/08/2020 Ferritin is 6 08/08/2020 Iron saturation 12%.   On review of systems, pt reports abdominal pain following eating/drinking, face/neck sweating and denies constipation, diarrhea, bloody/black stools, ice cravings, changes in bowel or urinary habits, back pains, changes in breathing, and any other symptoms.  MEDICAL HISTORY:  Past Medical History:  Diagnosis Date  . B12 deficiency   . Hyperlipidemia   . Hypertension   . Obesity   . Prediabetes   . Vitamin D deficiency     SURGICAL HISTORY: Past Surgical History:  Procedure Laterality Date  . APPENDECTOMY    . BREAST BIOPSY Right 1975  . INGUINAL HERNIA REPAIR Bilateral age 17    SOCIAL HISTORY: Social History   Socioeconomic History  . Marital status: Married    Spouse name: Not on file  . Number of children: Not  on file  . Years of education: Not on file  . Highest education level: Not on file  Occupational History  . Not on file  Tobacco Use  . Smoking status: Never Smoker  . Smokeless tobacco: Never Used  Substance and Sexual Activity  . Alcohol use: Yes    Alcohol/week: 2.0 standard drinks    Types: 2 Standard drinks or equivalent per week    Comment: wine  . Drug use: No  . Sexual activity: Not on file  Other Topics Concern  . Not on file  Social History Narrative  . Not on file   Social Determinants of Health    Financial Resource Strain: Not on file  Food Insecurity: Not on file  Transportation Needs: Not on file  Physical Activity: Not on file  Stress: Not on file  Social Connections: Not on file  Intimate Partner Violence: Not on file    FAMILY HISTORY: Family History  Problem Relation Age of Onset  . Hypertension Mother   . Alzheimer's disease Mother   . Hypertension Father   . Alzheimer's disease Father     ALLERGIES:  is allergic to lipitor [atorvastatin] and wellbutrin [bupropion].  MEDICATIONS:  Current Outpatient Medications  Medication Sig Dispense Refill  . alendronate (FOSAMAX) 70 MG tablet Take 1 tablet (70 mg total) by mouth once a week. Take with a full glass of water on an empty stomach. 12 tablet 1  . atenolol (TENORMIN) 100 MG tablet TAKE 1 TABLET DAILY FOR BLOOD PRESSURE 90 tablet 3  . Cholecalciferol (VITAMIN D PO) Take 5,000 Units by mouth daily.     Marland Kitchen escitalopram (LEXAPRO) 20 MG tablet Take  1 tablet  Daily  for Mood 90 tablet 0  . iron polysaccharides (NIFEREX) 150 MG capsule Take 1 capsule (150 mg total) by mouth daily. 30 capsule 5  . montelukast (SINGULAIR) 10 MG tablet TAKE 1 TABLET BY MOUTH DAILY FOR ALLERGIES 90 tablet 3  . Multiple Vitamins-Minerals (ONE DAILY MULTIVITAMIN WOMEN PO) Take 1 tablet by mouth daily. With iron and B vitamins    . phentermine (ADIPEX-P) 37.5 MG tablet TAKE 1/2 TO 1 TABLET EVERY MORNING FOR DIETING AND WEIGHT LOSS 90 tablet 1  . rosuvastatin (CRESTOR) 20 MG tablet Take  1 tablet  Daily  for Cholesterol 90 tablet 0   No current facility-administered medications for this visit.    REVIEW OF SYSTEMS:   10 Point review of Systems was done is negative except as noted above.  PHYSICAL EXAMINATION: ECOG PERFORMANCE STATUS: 1 - Symptomatic but completely ambulatory  . Vitals:   08/08/20 1020  BP: (!) 122/92  Pulse: 81  Resp: 18  Temp: (!) 97 F (36.1 C)  SpO2: 97%   Filed Weights   08/08/20 1020  Weight: 240 lb 8 oz  (109.1 kg)   .Body mass index is 43.99 kg/m.   GENERAL:alert, in no acute distress and comfortable SKIN: no acute rashes, no significant lesions EYES: conjunctiva are pink and non-injected, sclera anicteric OROPHARYNX: MMM, no exudates, no oropharyngeal erythema or ulceration NECK: supple, no JVD LYMPH:  no palpable lymphadenopathy in the cervical, axillary or inguinal regions LUNGS: clear to auscultation b/l with normal respiratory effort HEART: regular rate & rhythm ABDOMEN:  normoactive bowel sounds , non tender, not distended. Extremity: no pedal edema PSYCH: alert & oriented x 3 with fluent speech NEURO: no focal motor/sensory deficits  LABORATORY DATA:  I have reviewed the data as listed  . CBC Latest Ref Rng &  Units 08/08/2020 02/09/2020 01/30/2020  WBC 4.0 - 10.5 K/uL 9.0 7.4 7.0  Hemoglobin 12.0 - 15.0 g/dL 11.4(L) 12.6 12.5  Hematocrit 36.0 - 46.0 % 38.8 41.2 40.0  Platelets 150 - 400 K/uL 271 188 216    . CMP Latest Ref Rng & Units 08/08/2020 01/30/2020 12/06/2019  Glucose 70 - 99 mg/dL 103(H) 92 -  BUN 8 - 23 mg/dL 15 13 -  Creatinine 0.44 - 1.00 mg/dL 0.86 0.71 -  Sodium 135 - 145 mmol/L 140 141 -  Potassium 3.5 - 5.1 mmol/L 4.1 4.7 -  Chloride 98 - 111 mmol/L 104 102 -  CO2 22 - 32 mmol/L 28 32 -  Calcium 8.9 - 10.3 mg/dL 9.3 9.8 -  Total Protein 6.5 - 8.1 g/dL 7.5 6.7 6.5  Total Bilirubin 0.3 - 1.2 mg/dL 0.4 0.3 0.4  Alkaline Phos 38 - 126 U/L 81 - -  AST 15 - 41 U/L 13(L) 16 14  ALT 0 - 44 U/L 12 12 11      RADIOGRAPHIC STUDIES: I have personally reviewed the radiological images as listed and agreed with the findings in the report. No results found.  ASSESSMENT & PLAN:   65 yo female with   1) Iron deficiency Anemia  PLAN: -Discussed pt labwork today, 08/08/2020; Hgb slightly decreased, chemistries normal. -Discussed potential reasons for anemia despite oral iron: limited absorption, GI bleeding.  -Advised pt to switch oral Iron from once daily to BID  (once morning, once nightly). The pt is agreeable to this.  -Will continue to monitor Iron levels once results received. -Recommended pt continue to f/u w Dr. Earlean Shawl regarding GI bleeding or stool testing.  -Advised pt that given diet and iron supplementation, she should have enough iron. Will need to see GI again following next visit if continued decrease. -Advised pt that Phentermine is most likely explanation for increased sweating. Recommended increase water and decrease caffeine intake. -Continue to stay physically active. -Will see back in 6 months. Pt is aware to contact if increasing fatigue or ice cravings.   FOLLOW UP: RTC with Dr Irene Limbo with labs in 6 months   The total time spent in the appointment was 20 minutes and more than 50% was on counseling and direct patient cares.   All of the patient's questions were answered with apparent satisfaction. The patient knows to call the clinic with any problems, questions or concerns.    Sullivan Lone MD [place] AAHIVMS [facility] Hematology/Oncology Physician [facility]  (Office):       [phone] (Work cell):  [phone] (Fax):           [phone]  08/08/2020 10:29 AM  I, Reinaldo Raddle, am acting as scribe for Dr. Sullivan Lone, MD.

## 2020-08-08 ENCOUNTER — Telehealth: Payer: Self-pay | Admitting: Hematology

## 2020-08-08 ENCOUNTER — Inpatient Hospital Stay: Payer: BC Managed Care – PPO

## 2020-08-08 ENCOUNTER — Inpatient Hospital Stay: Payer: BC Managed Care – PPO | Attending: Hematology | Admitting: Hematology

## 2020-08-08 ENCOUNTER — Other Ambulatory Visit: Payer: Self-pay

## 2020-08-08 VITALS — BP 122/92 | HR 81 | Temp 97.0°F | Resp 18 | Ht 62.0 in | Wt 240.5 lb

## 2020-08-08 DIAGNOSIS — D509 Iron deficiency anemia, unspecified: Secondary | ICD-10-CM | POA: Diagnosis not present

## 2020-08-08 LAB — CMP (CANCER CENTER ONLY)
ALT: 12 U/L (ref 0–44)
AST: 13 U/L — ABNORMAL LOW (ref 15–41)
Albumin: 3.8 g/dL (ref 3.5–5.0)
Alkaline Phosphatase: 81 U/L (ref 38–126)
Anion gap: 8 (ref 5–15)
BUN: 15 mg/dL (ref 8–23)
CO2: 28 mmol/L (ref 22–32)
Calcium: 9.3 mg/dL (ref 8.9–10.3)
Chloride: 104 mmol/L (ref 98–111)
Creatinine: 0.86 mg/dL (ref 0.44–1.00)
GFR, Estimated: >60 mL/min (ref >60)
Glucose, Bld: 103 mg/dL — ABNORMAL HIGH (ref 70–99)
Potassium: 4.1 mmol/L (ref 3.5–5.1)
Sodium: 140 mmol/L (ref 135–145)
Total Bilirubin: 0.4 mg/dL (ref 0.3–1.2)
Total Protein: 7.5 g/dL (ref 6.5–8.1)

## 2020-08-08 LAB — CBC WITH DIFFERENTIAL/PLATELET
Abs Immature Granulocytes: 0.05 10*3/uL (ref 0.00–0.07)
Basophils Absolute: 0.1 10*3/uL (ref 0.0–0.1)
Basophils Relative: 1 %
Eosinophils Absolute: 0.3 10*3/uL (ref 0.0–0.5)
Eosinophils Relative: 3 %
HCT: 38.8 % (ref 36.0–46.0)
Hemoglobin: 11.4 g/dL — ABNORMAL LOW (ref 12.0–15.0)
Immature Granulocytes: 1 %
Lymphocytes Relative: 24 %
Lymphs Abs: 2.1 10*3/uL (ref 0.7–4.0)
MCH: 24.3 pg — ABNORMAL LOW (ref 26.0–34.0)
MCHC: 29.4 g/dL — ABNORMAL LOW (ref 30.0–36.0)
MCV: 82.7 fL (ref 80.0–100.0)
Monocytes Absolute: 0.6 10*3/uL (ref 0.1–1.0)
Monocytes Relative: 7 %
Neutro Abs: 5.9 10*3/uL (ref 1.7–7.7)
Neutrophils Relative %: 64 %
Platelets: 271 10*3/uL (ref 150–400)
RBC: 4.69 MIL/uL (ref 3.87–5.11)
RDW: 15.9 % — ABNORMAL HIGH (ref 11.5–15.5)
WBC: 9 10*3/uL (ref 4.0–10.5)
nRBC: 0 % (ref 0.0–0.2)

## 2020-08-08 LAB — IRON AND TIBC
Iron: 58 ug/dL (ref 41–142)
Saturation Ratios: 12 % — ABNORMAL LOW (ref 21–57)
TIBC: 503 ug/dL — ABNORMAL HIGH (ref 236–444)
UIBC: 444 ug/dL — ABNORMAL HIGH (ref 120–384)

## 2020-08-08 LAB — FERRITIN: Ferritin: 6 ng/mL — ABNORMAL LOW (ref 11–307)

## 2020-08-08 NOTE — Telephone Encounter (Signed)
Scheduled appointments per 3/9 los. Spoke to patient who is aware of appointments date and times. Gave patient calendar print out.

## 2020-08-14 ENCOUNTER — Encounter: Payer: BC Managed Care – PPO | Admitting: Internal Medicine

## 2020-08-15 ENCOUNTER — Telehealth: Payer: Self-pay | Admitting: *Deleted

## 2020-08-15 NOTE — Telephone Encounter (Signed)
-----   Message from Brunetta Genera, MD sent at 08/13/2020 11:15 PM EDT ----- Plz let patient know her ferritin /Iron levels are quite low. She has the option to increase po iron to 1 tab BID as dscussed. She also have option to consider IV Iron replacement to more completely correct her iron deficiency. F/u with her GI MD Dr Earlean Shawl for consideration of additional GI evaluation to determine source of blood loss and persistent iron deficiency.

## 2020-08-15 NOTE — Telephone Encounter (Signed)
Contacted patient regarding test results per Dr. Grier Mitts message.  Patient verbalized understanding of all information given. She states she will take PO iron BID at this time. She has a check up scheduled with PCP in May and will ask to have iron levels checked at that time. Encouraged to call this office as needed.

## 2020-09-30 ENCOUNTER — Encounter: Payer: Self-pay | Admitting: Internal Medicine

## 2020-09-30 NOTE — Progress Notes (Signed)
Annual Screening/Preventative Visit & Comprehensive Evaluation &  Examination   Future Appointments  Date Time Provider [place]  10/01/2020  3:00 PM Unk Pinto, MD [facility] None  02/15/2021 11:40 AM Brunetta Genera, MD [facility] None  10/03/2021 11:00 AM Unk Pinto, MD [facility] None        This very nice 65 y.o.  mwf presents for a Screening /Preventative Visit & comprehensive evaluation and management of multiple medical co-morbidities.  Patient has been followed for HTN, HLD, Prediabetes  and Vitamin D Deficiency.      Patient has hx/o Osteoporosis thru her [place] office and has stopped her Fosamax for reasons of "just not remembering to take it." She apparently is due soon for a 2 year f/u dexaBMD        HTN predates circa 2005. Patient's BP has been controlled at home and patient denies any cardiac symptoms as chest pain, palpitations, shortness of breath, dizziness or ankle swelling. Today's BP: 138/82       Patient's hyperlipidemia is not controlled with diet and Rosuvastatin. Patient denies myalgias or other medication SE's. Last lipids were not at goal:  Lab Results  Component Value Date   CHOL 215 (H) 01/30/2020   HDL 44 (L) 01/30/2020   LDLCALC 132 (H) 01/30/2020   TRIG 239 (H) 01/30/2020   CHOLHDL 4.9 01/30/2020        Patient has Morbid Obesity (BMI 44+ in Feb 2021) and consequent hx/o prediabetes (A1c 5.9% /2011 & 5.8%/2016) and patient denies reactive hypoglycemic symptoms, visual blurring, diabetic polys or paresthesias. Last A1c was normal & at goal:  Lab Results  Component Value Date   HGBA1C 5.5 01/30/2020        Finally, patient has history of Vitamin D Deficiency ("20" /2008& "34" /2013)and last Vitamin D was low  (goal 70-100):  Lab Results  Component Value Date   VD25OH 46 07/28/2019    Current Outpatient Medications on File Prior to Visit  Medication Sig  . atenolol  100 MG tablet TAKE 1 TABLET DAILY   .  VITAMIN D 5,000 Units Take  daily.   Marland Kitchen escitalopram  20 MG tablet Take  1 tablet  Daily  for Mood  . iron polysaccharides 150 MG capsule Take 1 capsule  daily.  . montelukast (SINGULAIR) 10 MG tablet TAKE 1 TAB DAILY FOR ALLERGIES  . DAILY MULTIVIT WOMEN  Take 1 tablet  daily  . phentermine 37.5 MG tablet TAKE 1/2 TO 1 TABLET EVERY MORNING   . rosuvastatin 20 MG tablet Take  1 tablet  Daily  for Cholesterol  . alendronate 70 MG tablet Take 1 tablet once a week.     Allergies  Allergen Reactions  . Lipitor [Atorvastatin]   . Wellbutrin [Bupropion]     dysphoria    Past Medical History:  Diagnosis Date  . B12 deficiency   . Hyperlipidemia   . Hypertension   . Obesity   . Prediabetes   . Vitamin D deficiency     Health Maintenance  Topic Date Due  . PAP SMEAR-Modifier  Never done  . MAMMOGRAM  11/30/2014  . COVID-19 Vaccine (3 - Booster for Pfizer series) 03/08/2020  . INFLUENZA VACCINE  12/31/2020  . TETANUS/TDAP  06/19/2027  . COLONOSCOPY   04/26/2028  . Hepatitis C Screening  Completed  . HIV Screening  Completed  . HPV VACCINES  Aged Out    Immunization History  Administered Date(s) Administered  . Influenza Inj Mdck Quad Pf  03/09/2019  . Influenza-Unspecified 04/28/2015, 02/26/2016, 03/09/2019  . PFIZER  SARS-COV-2 Vacci  08/10/2019, 09/07/2019  . PPD Test 01/31/2014, 02/12/2015, 03/10/2016, 06/18/2017, 07/28/2019  . Pneumococcal-23 06/17/2008  . Td 04/18/2007  . Tdap 06/18/2017  . Zoster Recombinat (Shingrix) 07/30/2020    Last Colon -  04/26/2018 - Dr Earlean Shawl - recc 10 yr f/u - due Dec 2029   Last M[facility]& dexaBMD 06/24/2018 at [facility]   Past Surgical History:  Procedure Laterality Date  . APPENDECTOMY    . BREAST BIOPSY Right 1975  . INGUINAL HERNIA REPAIR Bilateral age 58    Family History  Problem Relation Age of Onset  . Hypertension Mother   . Alzheimer's disease Mother   . Hypertension Father   . Alzheimer's disease  Father     Social History   Tobacco Use  . Smoking status: Never Smoker  . Smokeless tobacco: Never Used  Substance Use Topics  . Alcohol use: Yes    Alcohol/week: 2.0 standard drinks    Types: 2 Standard drinks or equivalent per week    Comment: wine  . Drug use: No    ROS Constitutional: Denies fever, chills, weight loss/gain, headaches, insomnia,  night sweats, and change in appetite. Does c/o fatigue. Eyes: Denies redness, blurred vision, diplopia, discharge, itchy, watery eyes.  ENT: Denies discharge, congestion, post nasal drip, epistaxis, sore throat, earache, hearing loss, dental pain, Tinnitus, Vertigo, Sinus pain, snoring.  Cardio: Denies chest pain, palpitations, irregular heartbeat, syncope, dyspnea, diaphoresis, orthopnea, PND, claudication, edema Respiratory: denies cough, dyspnea, DOE, pleurisy, hoarseness, laryngitis, wheezing.  Gastrointestinal: Denies dysphagia, heartburn, reflux, water brash, pain, cramps, nausea, vomiting, bloating, diarrhea, constipation, hematemesis, melena, hematochezia, jaundice, hemorrhoids Genitourinary: Denies dysuria, frequency, urgency, nocturia, hesitancy, discharge, hematuria, flank pain Breast: Breast lumps, nipple discharge, bleeding.  Musculoskeletal: Denies arthralgia, myalgia, stiffness, Jt. Swelling, pain, limp, and strain/sprain. Denies falls. Skin: Denies puritis, rash, hives, warts, acne, eczema, changing in skin lesion Neuro: No weakness, tremor, incoordination, spasms, paresthesia, pain Psychiatric: Denies confusion, memory loss, sensory loss. Denies Depression. Endocrine: Denies change in weight, skin, hair change, nocturia, and paresthesia, diabetic polys, visual blurring, hyper / hypo glycemic episodes.  Heme/Lymph: No excessive bleeding, bruising, enlarged lymph nodes.  Physical Exam  BP 138/82   Pulse 71   Temp 97.7 F (36.5 C)   Resp 16   Ht 5\' 2"  (1.575 m)   Wt 242 lb 12.8 oz (110.1 kg)   SpO2 95%   BMI 44.41  kg/m   General Appearance: Over nourished, well groomed and in no apparent distress.  Eyes: PERRLA, EOMs, conjunctiva no swelling or erythema, normal fundi and vessels. Sinuses: No frontal/maxillary tenderness ENT/Mouth: EACs patent / TMs  nl. Nares clear without erythema, swelling, mucoid exudates. Oral hygiene is good. No erythema, swelling, or exudate. Tongue normal, non-obstructing. Tonsils not swollen or erythematous. Hearing normal.  Neck: Supple, thyroid not palpable. No bruits, nodes or JVD. Respiratory: Respiratory effort normal.  BS equal and clear bilateral without rales, rhonci, wheezing or stridor. Cardio: Heart sounds are normal with regular rate and rhythm and no murmurs, rubs or gallops. Peripheral pulses are normal and equal bilaterally without edema. No aortic or femoral bruits. Chest: symmetric with normal excursions and percussion. Breasts: Symmetric, without lumps, nipple discharge, retractions, or fibrocystic changes.  Abdomen: Rotund, soft with bowel sounds active. Nontender, no guarding, rebound, hernias, masses, or organomegaly.  Lymphatics: Non tender without lymphadenopathy.  Musculoskeletal: Full ROM all peripheral extremities, joint stability, 5/5 strength, and normal gait.  Skin: Warm and dry without rashes, lesions, cyanosis, clubbing or  ecchymosis.  Neuro: Cranial nerves intact, reflexes equal bilaterally. Normal muscle tone, no cerebellar symptoms. Sensation intact.  Pysch: Alert and oriented x 3, normal affect, Insight and Judgment appropriate.   Assessment and Plan  1. Annual Preventative Screening Examination   2. Essential hypertension  - EKG 12-Lead - [place], RETROPERITNL ABD,  LTD - Urinalysis, Routine w reflex microscopic - Microalbumin / creatinine urine ratio - CBC with Differential/Platelet - COMPLETE METABOLIC PANEL WITH GFR - Magnesium - TSH  3. Hyperlipidemia, mixed  - EKG 12-Lead - [place], RETROPERITNL ABD,  LTD - Lipid panel  4.  Abnormal glucose  - EKG 12-Lead - [place], RETROPERITNL ABD,  LTD - Hemoglobin A1c - Insulin, random  5. Vitamin D deficiency  - VITAMIN D 25 Hydroxy  6. Morbid obesity with BMI of 40.0-44.9, adult (HCC)  - TSH  7. Screening for colorectal cancer  - POC Hemoccult Bld/Stl   8. Screening-pulmonary TB  - TB Skin Test  9. Screening for ischemic heart disease  - EKG 12-Lead  10. FH: hypertension  - EKG 12-Lead - [place], RETROPERITNL ABD,  LTD  11. Screening for AAA (aortic abdominal aneurysm)  - [place], RETROPERITNL ABD,  LTD - Iron,Total/Total Iron Binding Cap - Vitamin B12  12. Fatigue  - Iron,Total/Total Iron Binding Cap - Vitamin B12 - CBC with Differential/Platelet  13. Iron deficiency anemia  - Iron,Total/Total Iron Binding Cap  14. B12 deficiency  - Vitamin B12 - CBC with Differential/Platelet - TSH  15. Medication management  - Urinalysis, Routine w reflex microscopic - Microalbumin / creatinine urine ratio - Iron,Total/Total Iron Binding Cap - Vitamin B12 - CBC with Differential/Platelet - COMPLETE METABOLIC PANEL WITH GFR - Magnesium - Lipid panel - TSH - Hemoglobin A1c - Insulin, random - VITAMIN D 25 Hydroxy         Patient was counseled in prudent diet to achieve/maintain BMI less than 25 for weight control, BP monitoring, regular exercise and medications. Discussed med's effects and SE's. Screening labs and tests as requested with regular follow-up as recommended. Over 40 minutes of exam, counseling, chart review and high complex critical decision making was performed.   Kirtland Bouchard, MD

## 2020-09-30 NOTE — Patient Instructions (Signed)

## 2020-10-01 ENCOUNTER — Ambulatory Visit (INDEPENDENT_AMBULATORY_CARE_PROVIDER_SITE_OTHER): Payer: BC Managed Care – PPO | Admitting: Internal Medicine

## 2020-10-01 ENCOUNTER — Other Ambulatory Visit: Payer: Self-pay

## 2020-10-01 ENCOUNTER — Encounter: Payer: Self-pay | Admitting: Internal Medicine

## 2020-10-01 VITALS — BP 138/82 | HR 71 | Temp 97.7°F | Resp 16 | Ht 62.0 in | Wt 242.8 lb

## 2020-10-01 DIAGNOSIS — Z131 Encounter for screening for diabetes mellitus: Secondary | ICD-10-CM

## 2020-10-01 DIAGNOSIS — E782 Mixed hyperlipidemia: Secondary | ICD-10-CM

## 2020-10-01 DIAGNOSIS — Z8249 Family history of ischemic heart disease and other diseases of the circulatory system: Secondary | ICD-10-CM | POA: Diagnosis not present

## 2020-10-01 DIAGNOSIS — Z136 Encounter for screening for cardiovascular disorders: Secondary | ICD-10-CM

## 2020-10-01 DIAGNOSIS — I10 Essential (primary) hypertension: Secondary | ICD-10-CM | POA: Diagnosis not present

## 2020-10-01 DIAGNOSIS — D509 Iron deficiency anemia, unspecified: Secondary | ICD-10-CM

## 2020-10-01 DIAGNOSIS — R5383 Other fatigue: Secondary | ICD-10-CM

## 2020-10-01 DIAGNOSIS — Z0001 Encounter for general adult medical examination with abnormal findings: Secondary | ICD-10-CM

## 2020-10-01 DIAGNOSIS — Z1389 Encounter for screening for other disorder: Secondary | ICD-10-CM

## 2020-10-01 DIAGNOSIS — Z1329 Encounter for screening for other suspected endocrine disorder: Secondary | ICD-10-CM | POA: Diagnosis not present

## 2020-10-01 DIAGNOSIS — Z1212 Encounter for screening for malignant neoplasm of rectum: Secondary | ICD-10-CM

## 2020-10-01 DIAGNOSIS — R7309 Other abnormal glucose: Secondary | ICD-10-CM

## 2020-10-01 DIAGNOSIS — Z0000 Encounter for general adult medical examination without abnormal findings: Secondary | ICD-10-CM | POA: Diagnosis not present

## 2020-10-01 DIAGNOSIS — Z130 Encounter for screening for diseases of the blood and blood-forming organs and certain disorders involving the immune mechanism: Secondary | ICD-10-CM

## 2020-10-01 DIAGNOSIS — Z1211 Encounter for screening for malignant neoplasm of colon: Secondary | ICD-10-CM

## 2020-10-01 DIAGNOSIS — E559 Vitamin D deficiency, unspecified: Secondary | ICD-10-CM | POA: Diagnosis not present

## 2020-10-01 DIAGNOSIS — Z13220 Encounter for screening for lipoid disorders: Secondary | ICD-10-CM

## 2020-10-01 DIAGNOSIS — Z111 Encounter for screening for respiratory tuberculosis: Secondary | ICD-10-CM

## 2020-10-01 DIAGNOSIS — Z79899 Other long term (current) drug therapy: Secondary | ICD-10-CM

## 2020-10-01 DIAGNOSIS — E538 Deficiency of other specified B group vitamins: Secondary | ICD-10-CM

## 2020-10-01 NOTE — Progress Notes (Signed)
AortaScan < 3 cm. Within normal limits, per Dr McKeown. 

## 2020-10-02 LAB — COMPLETE METABOLIC PANEL WITH GFR
AG Ratio: 1.4 (calc) (ref 1.0–2.5)
ALT: 11 U/L (ref 6–29)
AST: 13 U/L (ref 10–35)
Albumin: 3.9 g/dL (ref 3.6–5.1)
Alkaline phosphatase (APISO): 70 U/L (ref 37–153)
BUN: 16 mg/dL (ref 7–25)
CO2: 30 mmol/L (ref 20–32)
Calcium: 10 mg/dL (ref 8.6–10.4)
Chloride: 103 mmol/L (ref 98–110)
Creat: 0.71 mg/dL (ref 0.50–0.99)
GFR, Est African American: 104 mL/min/{1.73_m2} (ref >=60)
GFR, Est Non African American: 90 mL/min/{1.73_m2} (ref >=60)
Globulin: 2.7 g/dL (calc) (ref 1.9–3.7)
Glucose, Bld: 116 mg/dL — ABNORMAL HIGH (ref 65–99)
Potassium: 4.4 mmol/L (ref 3.5–5.3)
Sodium: 140 mmol/L (ref 135–146)
Total Bilirubin: 0.2 mg/dL (ref 0.2–1.2)
Total Protein: 6.6 g/dL (ref 6.1–8.1)

## 2020-10-02 LAB — CBC WITH DIFFERENTIAL/PLATELET
Absolute Monocytes: 590 cells/uL (ref 200–950)
Basophils Absolute: 70 cells/uL (ref 0–200)
Basophils Relative: 0.7 %
Eosinophils Absolute: 280 cells/uL (ref 15–500)
Eosinophils Relative: 2.8 %
HCT: 39.1 % (ref 35.0–45.0)
Hemoglobin: 12 g/dL (ref 11.7–15.5)
Lymphs Abs: 2080 cells/uL (ref 850–3900)
MCH: 26 pg — ABNORMAL LOW (ref 27.0–33.0)
MCHC: 30.7 g/dL — ABNORMAL LOW (ref 32.0–36.0)
MCV: 84.8 fL (ref 80.0–100.0)
MPV: 11.4 fL (ref 7.5–12.5)
Monocytes Relative: 5.9 %
Neutro Abs: 6980 cells/uL (ref 1500–7800)
Neutrophils Relative %: 69.8 %
Platelets: 255 10*3/uL (ref 140–400)
RBC: 4.61 10*6/uL (ref 3.80–5.10)
RDW: 16.3 % — ABNORMAL HIGH (ref 11.0–15.0)
Total Lymphocyte: 20.8 %
WBC: 10 10*3/uL (ref 3.8–10.8)

## 2020-10-02 LAB — URINALYSIS, ROUTINE W REFLEX MICROSCOPIC
Bilirubin Urine: NEGATIVE
Glucose, UA: NEGATIVE
Hgb urine dipstick: NEGATIVE
Ketones, ur: NEGATIVE
Leukocytes,Ua: NEGATIVE
Nitrite: NEGATIVE
Protein, ur: NEGATIVE
Specific Gravity, Urine: 1.015 (ref 1.001–1.035)
pH: 5.5 (ref 5.0–8.0)

## 2020-10-02 LAB — MICROALBUMIN / CREATININE URINE RATIO
Creatinine, Urine: 78 mg/dL (ref 20–275)
Microalb Creat Ratio: 5 mcg/mg creat (ref <30)
Microalb, Ur: 0.4 mg/dL

## 2020-10-02 LAB — HEMOGLOBIN A1C
Hgb A1c MFr Bld: 5.5 % of total Hgb (ref <5.7)
Mean Plasma Glucose: 111 mg/dL
eAG (mmol/L): 6.2 mmol/L

## 2020-10-02 LAB — LIPID PANEL
Cholesterol: 265 mg/dL — ABNORMAL HIGH (ref <200)
HDL: 45 mg/dL — ABNORMAL LOW (ref >=50)
LDL Cholesterol (Calc): 165 mg/dL (calc) — ABNORMAL HIGH
Non-HDL Cholesterol (Calc): 220 mg/dL (calc) — ABNORMAL HIGH (ref <130)
Total CHOL/HDL Ratio: 5.9 (calc) — ABNORMAL HIGH (ref <5.0)
Triglycerides: 323 mg/dL — ABNORMAL HIGH (ref <150)

## 2020-10-02 LAB — IRON, TOTAL/TOTAL IRON BINDING CAP
%SAT: 11 % (calc) — ABNORMAL LOW (ref 16–45)
Iron: 50 ug/dL (ref 45–160)
TIBC: 438 mcg/dL (calc) (ref 250–450)

## 2020-10-02 LAB — VITAMIN D 25 HYDROXY (VIT D DEFICIENCY, FRACTURES): Vit D, 25-Hydroxy: 50 ng/mL (ref 30–100)

## 2020-10-02 LAB — INSULIN, RANDOM: Insulin: 61.4 u[IU]/mL — ABNORMAL HIGH

## 2020-10-02 LAB — MAGNESIUM: Magnesium: 2 mg/dL (ref 1.5–2.5)

## 2020-10-02 LAB — VITAMIN B12: Vitamin B-12: 268 pg/mL (ref 200–1100)

## 2020-10-02 LAB — TSH: TSH: 3.2 mIU/L (ref 0.40–4.50)

## 2020-10-02 NOTE — Progress Notes (Signed)
============================================================ - Test results slightly outside the reference range are not unusual. If there is anything important, I will review this with you,  otherwise it is considered normal test values.  If you have further questions,  please do not hesitate to contact me at the office or via My Chart.  ============================================================ ============================================================  - Iron is low - Recc take an OTC iron supplement  ============================================================ ============================================================  -  Vitamin B12 =   268 is    Very Low  (Ideal or Goal Vit B12 is between 450 - 1,100)    Low Vit B12 may be associated with Anemia , Fatigue,   Peripheral Neuropathy, Dementia, "Brain Fog", & Depression  - Recommend take a sub-lingual form of Vitamin B12 tablet   1,000 to 5,000 mcg tab that you dissolve under your tongue /Daily   - Can get Baron Sane - best price at [employer] or on [employer]  ============================================================ ============================================================  -            - Total Chol = 265 - Very high        (  Ideal or Goal is less than 180  )   - and   - bad/Dangerous LDL  Chol = 165 - Also too     high   - Also Triglycerides (   323  ) or fats in blood are too high  (goal is less than 150)    - Recommend avoid fried & greasy foods,  sweets / candy,   - Avoid white rice  (brown or wild rice or Quinoa is OK),   - Avoid white potatoes  (sweet potatoes are OK)   - Avoid anything made from white flour  - bagels, doughnuts, rolls, buns, biscuits, white and   wheat breads, pizza crust and traditional  pasta made of white flour & egg white  - (vegetarian pasta or spinach or wheat pasta is OK).    - Multi-grain bread is OK - like multi-grain flat bread or  sandwich thins.   -  Avoid alcohol in excess.   - Exercise is also important. ============================================================ ============================================================  - A1c - Normal -Great - No Diabetes ============================================================ ============================================================  - Virtamin D = 50 - is a little Low -  Vitamin D goal is between 70-100.   - Please make sure that you are taking your Vitamin D as directed.   - It is very important as a natural anti-inflammatory and helping the  immune system protect against viral infections, like the Covid-19    helping hair, skin, and nails, as well as reducing stroke and  heart attack risk.   - It helps your bones and helps with mood.  - It also decreases numerous cancer risks so please  take it as directed.   - Low Vit D is associated with a 200-300% higher risk for  CANCER   and 200-300% higher risk for HEART   ATTACK  &  STROKE.    - It is also associated with higher death rate at younger ages,   autoimmune diseases like Rheumatoid arthritis, Lupus,  Multiple Sclerosis.     - Also many other serious conditions, like depression, Alzheimer's  Dementia, infertility, muscle aches, fatigue, fibromyalgia   - just to name a few. ============================================================ ============================================================  - All Else - CBC - Kidneys - Electrolytes - Liver - Magnesium & Thyroid    - all  Normal / OK ===========================================================  ===========================================================

## 2020-10-17 ENCOUNTER — Other Ambulatory Visit: Payer: Self-pay | Admitting: Internal Medicine

## 2020-10-17 DIAGNOSIS — F419 Anxiety disorder, unspecified: Secondary | ICD-10-CM

## 2021-01-28 ENCOUNTER — Other Ambulatory Visit: Payer: Self-pay | Admitting: Internal Medicine

## 2021-01-28 DIAGNOSIS — R7303 Prediabetes: Secondary | ICD-10-CM

## 2021-02-08 ENCOUNTER — Encounter: Payer: Self-pay | Admitting: Hematology

## 2021-02-08 ENCOUNTER — Ambulatory Visit: Payer: BC Managed Care – PPO | Admitting: Hematology

## 2021-02-08 ENCOUNTER — Other Ambulatory Visit: Payer: BC Managed Care – PPO

## 2021-02-13 ENCOUNTER — Encounter: Payer: Self-pay | Admitting: Hematology

## 2021-02-14 ENCOUNTER — Other Ambulatory Visit: Payer: Self-pay

## 2021-02-14 DIAGNOSIS — D509 Iron deficiency anemia, unspecified: Secondary | ICD-10-CM

## 2021-02-15 ENCOUNTER — Other Ambulatory Visit: Payer: Self-pay

## 2021-02-15 ENCOUNTER — Inpatient Hospital Stay (HOSPITAL_BASED_OUTPATIENT_CLINIC_OR_DEPARTMENT_OTHER): Payer: Medicare PPO | Admitting: Hematology

## 2021-02-15 ENCOUNTER — Telehealth: Payer: Self-pay | Admitting: Hematology

## 2021-02-15 ENCOUNTER — Encounter: Payer: Self-pay | Admitting: Hematology

## 2021-02-15 ENCOUNTER — Inpatient Hospital Stay: Payer: Medicare PPO | Attending: Hematology

## 2021-02-15 VITALS — BP 141/68 | HR 99 | Temp 98.7°F | Resp 17 | Ht 62.0 in | Wt 242.6 lb

## 2021-02-15 DIAGNOSIS — D509 Iron deficiency anemia, unspecified: Secondary | ICD-10-CM | POA: Diagnosis present

## 2021-02-15 DIAGNOSIS — Z1211 Encounter for screening for malignant neoplasm of colon: Secondary | ICD-10-CM

## 2021-02-15 LAB — CBC WITH DIFFERENTIAL (CANCER CENTER ONLY)
Abs Immature Granulocytes: 0.03 10*3/uL (ref 0.00–0.07)
Basophils Absolute: 0.1 10*3/uL (ref 0.0–0.1)
Basophils Relative: 1 %
Eosinophils Absolute: 0.4 10*3/uL (ref 0.0–0.5)
Eosinophils Relative: 5 %
HCT: 40 % (ref 36.0–46.0)
Hemoglobin: 12.2 g/dL (ref 12.0–15.0)
Immature Granulocytes: 0 %
Lymphocytes Relative: 28 %
Lymphs Abs: 2.2 10*3/uL (ref 0.7–4.0)
MCH: 27.3 pg (ref 26.0–34.0)
MCHC: 30.5 g/dL (ref 30.0–36.0)
MCV: 89.5 fL (ref 80.0–100.0)
Monocytes Absolute: 0.6 10*3/uL (ref 0.1–1.0)
Monocytes Relative: 8 %
Neutro Abs: 4.7 10*3/uL (ref 1.7–7.7)
Neutrophils Relative %: 58 %
Platelet Count: 226 10*3/uL (ref 150–400)
RBC: 4.47 MIL/uL (ref 3.87–5.11)
RDW: 15.2 % (ref 11.5–15.5)
WBC Count: 8 10*3/uL (ref 4.0–10.5)
nRBC: 0 % (ref 0.0–0.2)

## 2021-02-15 LAB — CMP (CANCER CENTER ONLY)
ALT: 13 U/L (ref 0–44)
AST: 15 U/L (ref 15–41)
Albumin: 3.8 g/dL (ref 3.5–5.0)
Alkaline Phosphatase: 77 U/L (ref 38–126)
Anion gap: 9 (ref 5–15)
BUN: 16 mg/dL (ref 8–23)
CO2: 28 mmol/L (ref 22–32)
Calcium: 10 mg/dL (ref 8.9–10.3)
Chloride: 103 mmol/L (ref 98–111)
Creatinine: 0.8 mg/dL (ref 0.44–1.00)
GFR, Estimated: >60 mL/min (ref >60)
Glucose, Bld: 97 mg/dL (ref 70–99)
Potassium: 4.3 mmol/L (ref 3.5–5.1)
Sodium: 140 mmol/L (ref 135–145)
Total Bilirubin: 0.4 mg/dL (ref 0.3–1.2)
Total Protein: 7.1 g/dL (ref 6.5–8.1)

## 2021-02-15 LAB — IRON AND TIBC
Iron: 220 ug/dL — ABNORMAL HIGH (ref 41–142)
Saturation Ratios: 48 % (ref 21–57)
TIBC: 460 ug/dL — ABNORMAL HIGH (ref 236–444)
UIBC: 240 ug/dL (ref 120–384)

## 2021-02-15 LAB — FERRITIN: Ferritin: 10 ng/mL — ABNORMAL LOW (ref 11–307)

## 2021-02-15 NOTE — Telephone Encounter (Signed)
RTC with Dr Irene Limbo as needed per 9/16 los

## 2021-02-21 ENCOUNTER — Encounter: Payer: Self-pay | Admitting: Hematology

## 2021-02-21 NOTE — Progress Notes (Signed)
MEDICARE ANNUAL WELLNESS VISIT AND FOLLOW UP  Assessment:    Diagnoses and all orders for this visit:  Welcome to Medicare preventive visit Due annually  Health maintenance reviewed Schedule overdue GYN appointment; last reports requested   Essential hypertension Continue medication Monitor blood pressure at home; call if consistently over 130/80 Continue DASH diet.   Reminder to go to the ER if any CP, SOB, nausea, dizziness, severe HA, changes vision/speech, left arm numbness and tingling and jaw pain. -     CBC with Differential/Platelet -     COMPLETE METABOLIC PANEL WITH GFR -     Magnesium  Age-related osteoporosis without current pathological fracture Overdue DEXA - follow up Dr. Radene Knee continue Vit D supplement, Vitamin K2 encouraged, high Ca diet, weight bearing exercises  Sjogren's syndrome, with unspecified organ involvement ([place]) Monitor; ophth and dental regularly   Morbid obesity ([place])- BMI 40+ Long discussion about weight loss, diet, and exercise Recommended diet heavy in fruits and veggies and low in animal meats, cheeses, and dairy products, appropriate calorie intake Patient will work on adding walking 20 min daily  Continue to limit processed carbohydrates, encouraged high fiber diet, handout given Monitor weight once weekly  Has failed phentermine/topamax, wellbutrin; possible benefit with focus/weight from vyvanse, will do short trial  Significant obesity is significant risk for CVD, cancer, metabolic disorder - monitor closely  Discussed appropriate weight for height and initial goal (<230 lb) Follow up in 4-6 weeks  Abnormal glucose Recent A1Cs at goal Discussed diet/exercise, weight management  Defer A1C; check CMP -     COMPLETE METABOLIC PANEL WITH GFR  Hyperlipidemia, mixed Continue medications: rosuvastatin 20 mg daily  Continue low cholesterol diet and exercise.  Check lipid panel.  -     Lipid panel -     TSH  Medication  management -     CBC with Differential/Platelet -     COMPLETE METABOLIC PANEL WITH GFR -     Magnesium  Vitamin D deficiency Continue supplement  B12 deficiency On supplement, recheck levels -     Vitamin B12  Iron deficiency anemia, unspecified iron deficiency anemia type Dr. Irene Limbo follows, iron check by him -  -     CBC with Differential/Platelet  Anxiety Well managed by current regimen; continue medications Stress management techniques discussed, increase water, good sleep hygiene discussed, increase exercise, and increase veggies.   Attention deficit Possible undiagnosed mild ADD, has failed wellbutrin, may benefit from eating behaviors from vyvanse Risks and bneefits discussed. Will do 30 day trial with close follow up for benefit. The patient was counseled on the addictive nature of the medication and was encouraged to take drug holidays when not needed.  -     lisdexamfetamine (VYVANSE) 30 MG capsule; Take 1 tab daily as needed for focus and binge eating. Do not take daily, ideally limit to 5 days/week.  Need for pneumonia vaccination - 20 valent pneumococcal vaccine administered without complication today    Over 40 minutes of exam, counseling, chart review and critical decision making was performed Future Appointments  Date Time Provider [place]  05/28/2021 10:30 AM Unk Pinto, MD [facility] None  10/03/2021 11:00 AM Unk Pinto, MD [facility] None  02/25/2022 11:00 AM Liane Comber, NP [facility] None     Plan:   During the course of the visit the patient was educated and counseled about appropriate screening and preventive services including:   Pneumococcal vaccine  Prevnar 13 Influenza vaccine Td vaccine Screening electrocardiogram Bone  densitometry screening Colorectal cancer screening Diabetes screening Glaucoma screening Nutrition counseling  Advanced directives: requested   Subjective:  Whitney Herrera is a 65 y.o. female who  presents for Medicare Annual Wellness Visit and 3 month follow up. She has Essential hypertension; Hyperlipidemia, mixed; Morbid obesity ([place]) - BMI 40+; B12 deficiency; Vitamin D deficiency; Abnormal glucose; Medication management; Sjogren's syndrome ([place]); Anxiety; Exertional dyspnea; Osteoporosis; Symptomatic anemia; and Iron deficiency anemia on their problem list.  She was prescribed fosamax weekly for osteoporosis in 06/2018 via GYN Dr. Radene Knee, admits hasn't been taking, forgets.   She is currently taking lexapro 20 mg daily that was started for work related stress/anxiety several years ago, doing much better since retiring in July 2019, however continues due to pandemic. Denies depression, lack of enjoyment, but reports noting persistent poor focus, struggles to start and complete tasks. Has tried wellbutrin but didn't tolerate well.   Left knee pain, had steroid injection by [facility], planning to follow up soon, still bothering her fairly consistently.   BMI is Body mass index is 44.26 kg/m., she is working on diet, admits minimal exercise due to heat. Admits hasn't been eating as well as she should in the last 6 months, has bee trying to keep sweets out of the house. Trying to reduce portions for carbs - breads and chips.  She is fairly ambivalent about making aggressive changes She is prescribed phentermine for weight loss, taking a break for last several months. Unsure if helps with appetite but does report some energy boost. She has tried wellbutrin in the past with limited benefit, felt bad taking this. While on the medication they have lost 0 lbs since last visit. They deny palpitations, anxiety, trouble sleeping, elevated BP. Didn't see benefit with topamax.  Wt Readings from Last 3 Encounters:  02/22/21 242 lb (109.8 kg)  02/15/21 242 lb 9.6 oz (110 kg)  10/01/20 242 lb 12.8 oz (110.1 kg)    Her blood pressure has been controlled at home, today their BP is BP: 136/72 She does  not workout. She denies chest pain, shortness of breath, dizziness.   She is on cholesterol medication (rosuvastatin 20 mg daily, ? Was off last visit) and denies myalgias. Her cholesterol is not at goal. The cholesterol last visit was:   Lab Results  Component Value Date   CHOL 265 (H) 10/01/2020   HDL 45 (L) 10/01/2020   LDLCALC 165 (H) 10/01/2020   TRIG 323 (H) 10/01/2020   CHOLHDL 5.9 (H) 10/01/2020   She has not been working on diet and exercise for glucose management, and denies increased appetite, nausea, paresthesia of the feet, polydipsia, and polyuria. Last A1C in the office was:  Lab Results  Component Value Date   HGBA1C 5.5 10/01/2020   Last GFR: Lab Results  Component Value Date   GFRNONAA >60 02/15/2021   Patient is on Vitamin D supplement.   Lab Results  Component Value Date   VD25OH 61 10/01/2020     She has anemia; iron deficiency, had negative hemoccult x 3 in April 2021, was on iron supplement but stopped due to GI SE, Had negatvie EGD and colonosocpy with Dr. Earlean Shawl in Nov 2019 for anemia workup. Was referred to Dr. Irene Limbo and was underwent IV injectafer weekly x 2, recommended iron polysaccharide and B complex, had follow up last week but pending recommenndations CBC Latest Ref Rng & Units 02/15/2021 10/01/2020 08/08/2020  WBC 4.0 - 10.5 K/uL 8.0 10.0 9.0  Hemoglobin 12.0 -  15.0 g/dL 12.2 12.0 11.4(L)  Hematocrit 36.0 - 46.0 % 40.0 39.1 38.8  Platelets 150 - 400 K/uL 226 255 271   Taking ferex 150 mg daily Lab Results  Component Value Date   IRON 220 (H) 02/15/2021   TIBC 460 (H) 02/15/2021   FERRITIN 10 (L) 02/15/2021   Taking 2500 mcg SL daily  Lab Results  Component Value Date   VITAMINB12 268 10/01/2020      Medication Review: Current Outpatient Medications on File Prior to Visit  Medication Sig Dispense Refill   atenolol (TENORMIN) 100 MG tablet TAKE 1 TABLET DAILY FOR BLOOD PRESSURE 90 tablet 3   Cholecalciferol (VITAMIN D PO) Take 5,000 Units  by mouth daily.      Cyanocobalamin (B-12) 2500 MCG SUBL Place 1 tablet under the tongue daily.     escitalopram (LEXAPRO) 20 MG tablet TAKE 1 TABLET DAILY FOR MOOD 90 tablet 3   iron polysaccharides (NIFEREX) 150 MG capsule Take 1 capsule (150 mg total) by mouth daily. 30 capsule 5   Multiple Vitamins-Minerals (ONE DAILY MULTIVITAMIN WOMEN PO) Take 1 tablet by mouth daily. With iron and B vitamins     No current facility-administered medications on file prior to visit.    Allergies  Allergen Reactions   Lipitor [Atorvastatin]    Wellbutrin [Bupropion]     dysphoria    Current Problems (verified) Patient Active Problem List   Diagnosis Date Noted   Iron deficiency anemia 11/09/2019   Symptomatic anemia 11/04/2019   Osteoporosis 10/15/2018   Anxiety 04/19/2018   Exertional dyspnea 04/19/2018   Sjogren's syndrome ([place]) 05/08/2014   Medication management 07/19/2013   Abnormal glucose    Essential hypertension    Hyperlipidemia, mixed    Morbid obesity ([place]) - BMI 40+    B12 deficiency    Vitamin D deficiency     Screening Tests Immunization History  Administered Date(s) Administered   Influenza Inj Mdck Quad Pf 03/09/2019   Influenza, High Dose Seasonal PF 02/21/2021   Influenza-Unspecified 04/28/2015, 02/26/2016, 03/09/2019   PFIZER(Purple Top)SARS-COV-2 Vaccination 08/10/2019, 09/07/2019, 12/22/2020   PPD Test 01/31/2014, 02/12/2015, 03/10/2016, 06/18/2017, 07/28/2019, 10/01/2020   Pneumococcal-Unspecified 06/17/2008   Td 04/18/2007   Tdap 06/18/2017   Zoster Recombinat (Shingrix) 07/30/2020, 10/19/2020    Preventative care: Last colonoscopy: 2019, Dr. Earlean Shawl, 10 year recall  Last mammogram: reports last 2020 in GYN office Dr. Radene Knee, overdue, she plans to call to schedule  Last pap smear/pelvic exam: last 2020, never abnormal, by GYN    DEXA: reports 2020  Prior vaccinations: TD or Tdap: 2019  Influenza: 02/21/2021  Pneumococcal: 2010 Pneumo 20: TODAY   Shingrix: 2/2, 2022 Covid 19: 2/2, booster 11/2020  Names of Other Physician/Practitioners you currently use: 1. [facility] here for primary care 2. [facility], eye doctor, last visit 2021, glasses, goes annually 3. Dr. Radford Pax, dentist, last visit 2022, q69m  Patient Care Team: Unk Pinto, MD as PCP - General (Internal Medicine) Arvella Nigh, MD as Consulting Physician (Obstetrics and Gynecology)  SURGICAL HISTORY She  has a past surgical history that includes Breast biopsy (Right, 1975); Appendectomy; and Inguinal hernia repair (Bilateral, age 36). FAMILY HISTORY Her family history includes Alzheimer's disease in her father and mother; Hypertension in her father and mother. SOCIAL HISTORY She  reports that she has never smoked. She has never used smokeless tobacco. She reports current alcohol use of about 2.0 standard drinks per week. She reports that she does not use drugs.  MEDICARE WELLNESS OBJECTIVES: Physical activity: Current Exercise Habits: The patient does not participate in regular exercise at present, Exercise limited by: orthopedic condition(s) Cardiac risk factors: Cardiac Risk Factors include: advanced age (>6men, >72 women);dyslipidemia;hypertension;obesity (BMI >30kg/m2);sedentary lifestyle Depression/mood screen:   Depression screen [facility] 2/9 02/22/2021  Decreased Interest 0  Down, Depressed, Hopeless 0  PHQ - 2 Score 0    ADLs:  In your present state of health, do you have any difficulty performing the following activities: 02/22/2021 09/30/2020  Hearing? N N  Vision? N N  Difficulty concentrating or making decisions? N N  Walking or climbing stairs? N N  Dressing or bathing? N N  Doing errands, shopping? N N  Some recent data might be hidden     Cognitive Testing  Alert? Yes  Normal Appearance?Yes  Oriented to person? Yes  Place? Yes   Time? Yes  Recall of three objects?  Yes  Can perform simple calculations?  Yes  Displays appropriate judgment?Yes  Can read the correct time from a watch face?Yes  EOL planning: Does Patient Have a Medical Advance Directive?: Yes Type of Advance Directive: Healthcare Power of Attorney, Living will Does patient want to make changes to medical advance directive?: No - Patient declined Copy of [place] in Chart?: No - copy requested  Review of Systems  Constitutional:  Positive for malaise/fatigue. Negative for weight loss.  HENT:  Negative for hearing loss and tinnitus.   Eyes:  Negative for blurred vision and double vision.  Respiratory:  Negative for cough, sputum production, shortness of breath and wheezing.   Cardiovascular:  Negative for chest pain, palpitations, orthopnea, claudication, leg swelling and PND.  Gastrointestinal:  Negative for abdominal pain, blood in stool, constipation, diarrhea, heartburn, melena, nausea and vomiting.  Genitourinary: Negative.   Musculoskeletal:  Positive for joint pain (L knee, ortho follows). Negative for falls and myalgias.  Skin:  Negative for rash.  Neurological:  Negative for dizziness, tingling, sensory change, weakness and headaches.  Endo/Heme/Allergies:  Negative for polydipsia.  Psychiatric/Behavioral: Negative.  Negative for depression, memory loss, substance abuse and suicidal ideas. The patient is not nervous/anxious and does not have insomnia.        Poor focus and motivation   All other systems reviewed and are negative.   Objective:     Today's Vitals   02/22/21 1116  BP: 136/72  Pulse: 68  Temp: (!) 97.3 F (36.3 C)  SpO2: 99%  Weight: 242 lb (109.8 kg)   Body mass index is 44.26 kg/m.  General appearance: alert, no distress, WD/WN, female HEENT: normocephalic, sclerae anicteric, TMs pearly, nares patent, no discharge or erythema, pharynx normal Oral cavity: MMM, no lesions Neck: supple, no lymphadenopathy, no thyromegaly, no masses Heart: RRR, normal S1, S2, no  murmurs Lungs: CTA bilaterally, no wheezes, rhonchi, or rales Abdomen: +bs, soft, morbidly obese abdomen, non tender, non distended, no masses, no hepatomegaly, no splenomegaly Musculoskeletal: nontender, no swelling, no obvious deformity Extremities: no edema, no cyanosis, no clubbing Pulses: 2+ symmetric, upper and lower extremities, normal cap refill Neurological: alert, oriented x 3, CN2-12 intact, strength normal upper extremities and lower extremities, sensation normal throughout, DTRs 2+ throughout, no cerebellar signs, gait normal Psychiatric: normal affect, behavior normal, pleasant   Medicare Attestation I have personally reviewed: The patient's medical and social history Their use of alcohol, tobacco or illicit drugs Their current medications and supplements The patient's functional ability including ADLs,fall risks, home safety risks, cognitive, and hearing and visual impairment  Diet and physical activities Evidence for depression or mood disorders  The patient's weight, height, BMI, and visual acuity have been recorded in the chart.  I have made referrals, counseling, and provided education to the patient based on review of the above and I have provided the patient with a written personalized care plan for preventive services.     Izora Ribas, NP   02/22/2021

## 2021-02-22 ENCOUNTER — Telehealth: Payer: Self-pay

## 2021-02-22 ENCOUNTER — Ambulatory Visit: Payer: Medicare PPO | Admitting: Adult Health

## 2021-02-22 ENCOUNTER — Other Ambulatory Visit: Payer: Self-pay

## 2021-02-22 ENCOUNTER — Encounter: Payer: Self-pay | Admitting: Hematology

## 2021-02-22 ENCOUNTER — Encounter: Payer: Self-pay | Admitting: Adult Health

## 2021-02-22 VITALS — BP 136/72 | HR 68 | Temp 97.3°F | Wt 242.0 lb

## 2021-02-22 DIAGNOSIS — R41840 Attention and concentration deficit: Secondary | ICD-10-CM

## 2021-02-22 DIAGNOSIS — R0600 Dyspnea, unspecified: Secondary | ICD-10-CM

## 2021-02-22 DIAGNOSIS — Z79899 Other long term (current) drug therapy: Secondary | ICD-10-CM

## 2021-02-22 DIAGNOSIS — M3500 Sicca syndrome, unspecified: Secondary | ICD-10-CM | POA: Diagnosis not present

## 2021-02-22 DIAGNOSIS — Z0000 Encounter for general adult medical examination without abnormal findings: Secondary | ICD-10-CM

## 2021-02-22 DIAGNOSIS — R7309 Other abnormal glucose: Secondary | ICD-10-CM

## 2021-02-22 DIAGNOSIS — R6889 Other general symptoms and signs: Secondary | ICD-10-CM | POA: Diagnosis not present

## 2021-02-22 DIAGNOSIS — I10 Essential (primary) hypertension: Secondary | ICD-10-CM

## 2021-02-22 DIAGNOSIS — D509 Iron deficiency anemia, unspecified: Secondary | ICD-10-CM

## 2021-02-22 DIAGNOSIS — F419 Anxiety disorder, unspecified: Secondary | ICD-10-CM

## 2021-02-22 DIAGNOSIS — E559 Vitamin D deficiency, unspecified: Secondary | ICD-10-CM

## 2021-02-22 DIAGNOSIS — Z23 Encounter for immunization: Secondary | ICD-10-CM | POA: Diagnosis not present

## 2021-02-22 DIAGNOSIS — Z0001 Encounter for general adult medical examination with abnormal findings: Secondary | ICD-10-CM | POA: Diagnosis not present

## 2021-02-22 DIAGNOSIS — M810 Age-related osteoporosis without current pathological fracture: Secondary | ICD-10-CM | POA: Diagnosis not present

## 2021-02-22 DIAGNOSIS — E782 Mixed hyperlipidemia: Secondary | ICD-10-CM

## 2021-02-22 DIAGNOSIS — E538 Deficiency of other specified B group vitamins: Secondary | ICD-10-CM

## 2021-02-22 MED ORDER — ROSUVASTATIN CALCIUM 20 MG PO TABS
ORAL_TABLET | ORAL | 1 refills | Status: DC
Start: 2021-02-22 — End: 2021-11-06

## 2021-02-22 MED ORDER — LISDEXAMFETAMINE DIMESYLATE 30 MG PO CAPS: ORAL_CAPSULE | ORAL | 0 refills | Status: DC

## 2021-02-22 NOTE — Progress Notes (Signed)
HEMATOLOGY/ONCOLOGY CLINIC NOTE  Date of Service: .02/15/2021   Patient Care Team: Unk Pinto, MD as PCP - General (Internal Medicine)  CHIEF COMPLAINTS/PURPOSE OF CONSULTATION:  IDA  HISTORY OF PRESENTING ILLNESS:   Whitney Herrera is a wonderful 65 y.o. female who has been referred to [place] by Dr. Melford Aase for evaluation and management of iron deficiency anemia. The pt reports that she is doing well overall.  The pt reports that she was told that she was iron deficient about a year ago. During this time she had a Colonoscopy, EGD, and stool sample testing with Dr. Earlean Shawl who found nothing of concern. She was then placed on PO Iron, which corrected her blood counts. Pt had some stomach upset with PO Iron, but it was not too bothersome. She repeated stool sample testing with Chancy Hurter, CMA in April which was negative. Pt has never required an IV Iron infusion.   She has no known food intolerances and has used acid suppressants occasionally. Pt has felt more SOB and fatigued over the last few months, but denies any abnormal cravings. She denies any abnormal/excessive bleeding. She feels leg restlessness as she lays down to sleep sometimes. Pt was diagnosed with Shingles last week, but was treated and her symptoms are resolving.   Most recent lab results (11/03/2019) of CBC is as follows: all values are WNL except for RBC at 3.49, Hgb at 8.2, HCT at 27.7, MCV at 79.4, MCH at 23.5, MCHC at 29.6, RDW at 18.7, ALT at 43. 11/09/2019 Iron Panel is as follows: Iron at 22, TIBC at 526, Sat Ratios at 4, Ferritin at 5  On review of systems, pt reports fatigue, SOB, restless legs and denies bloody/black stools, gum bleeds, nose bleeds, hematuria, abdominal pain, rashes, ice cravings  and any other symptoms.   On PMHx the pt reports Vitamin B12 deficiency, HLD, HTN, Vitamin D deficiency.  INTERVAL HISTORY:  Whitney Herrera is a wonderful 65 y.o. female who is here for evaluation and  management of iron deficiency anemia. The patient's last visit with [place] was on 08/08/2020. The pt reports that she is doing well overall.  The pt reports no new concerns.  She has been taking her p.o. iron twice daily. Lab results today 02/15/2021  CBC w/diff normal hemoglobin level of 12.2 with a hematocrit of 40, WBC count of 8k, platelets of 226k CMP within normal limits Ferritin at 10 about the same as previous ferritin of 6, 6 months ago.  Iron saturation on 48%.  Patient notes no overt GI bleeding.  MEDICAL HISTORY:  Past Medical History:  Diagnosis Date   B12 deficiency    Hyperlipidemia    Hypertension    Obesity    Prediabetes    Vitamin D deficiency     SURGICAL HISTORY: Past Surgical History:  Procedure Laterality Date   APPENDECTOMY     BREAST BIOPSY Right 1975   INGUINAL HERNIA REPAIR Bilateral age 23    SOCIAL HISTORY: Social History   Socioeconomic History   Marital status: Married    Spouse name: Not on file   Number of children: Not on file   Years of education: Not on file   Highest education level: Not on file  Occupational History   Not on file  Tobacco Use   Smoking status: Never   Smokeless tobacco: Never  Substance and Sexual Activity   Alcohol use: Yes    Alcohol/week: 2.0 standard drinks    Types: 2 Standard  drinks or equivalent per week    Comment: wine   Drug use: No   Sexual activity: Not on file  Other Topics Concern   Not on file  Social History Narrative   Not on file   Social Determinants of Health   Financial Resource Strain: Not on file  Food Insecurity: Not on file  Transportation Needs: Not on file  Physical Activity: Not on file  Stress: Not on file  Social Connections: Not on file  Intimate Partner Violence: Not on file    FAMILY HISTORY: Family History  Problem Relation Age of Onset   Hypertension Mother    Alzheimer's disease Mother    Hypertension Father    Alzheimer's disease Father     ALLERGIES:  is  allergic to lipitor [atorvastatin] and wellbutrin [bupropion].  MEDICATIONS:  Current Outpatient Medications  Medication Sig Dispense Refill   atenolol (TENORMIN) 100 MG tablet TAKE 1 TABLET DAILY FOR BLOOD PRESSURE 90 tablet 3   Cholecalciferol (VITAMIN D PO) Take 5,000 Units by mouth daily.      escitalopram (LEXAPRO) 20 MG tablet TAKE 1 TABLET DAILY FOR MOOD 90 tablet 3   iron polysaccharides (NIFEREX) 150 MG capsule Take 1 capsule (150 mg total) by mouth daily. 30 capsule 5   Multiple Vitamins-Minerals (ONE DAILY MULTIVITAMIN WOMEN PO) Take 1 tablet by mouth daily. With iron and B vitamins     phentermine (ADIPEX-P) 37.5 MG tablet TAKE 1/2 TO 1 TABLET EVERY MORNING FOR DIETING AND WEIGHT LOSS 90 tablet 1   rosuvastatin (CRESTOR) 20 MG tablet Take  1 tablet  Daily  for Cholesterol 90 tablet 0   vitamin B-12 (CYANOCOBALAMIN) 500 MCG tablet Take 500 mcg by mouth daily.     No current facility-administered medications for this visit.    REVIEW OF SYSTEMS:   .10 Point review of Systems was done is negative except as noted above.  PHYSICAL EXAMINATION: ECOG PERFORMANCE STATUS: 1 - Symptomatic but completely ambulatory  . Vitals:   02/15/21 1136  BP: (!) 141/68  Pulse: 99  Resp: 17  Temp: 98.7 F (37.1 C)  SpO2: 95%   Filed Weights   02/15/21 1136  Weight: 242 lb 9.6 oz (110 kg)   .Body mass index is 44.37 kg/m.   Marland Kitchen GENERAL:alert, in no acute distress and comfortable SKIN: no acute rashes, no significant lesions EYES: conjunctiva are pink and non-injected, sclera anicteric OROPHARYNX: MMM, no exudates, no oropharyngeal erythema or ulceration NECK: supple, no JVD LYMPH:  no palpable lymphadenopathy in the cervical, axillary or inguinal regions LUNGS: clear to auscultation b/l with normal respiratory effort HEART: regular rate & rhythm ABDOMEN:  normoactive bowel sounds , non tender, not distended. Extremity: no pedal edema PSYCH: alert & oriented x 3 with fluent  speech NEURO: no focal motor/sensory deficits   LABORATORY DATA:  I have reviewed the data as listed  . CBC Latest Ref Rng & Units 02/15/2021 10/01/2020 08/08/2020  WBC 4.0 - 10.5 K/uL 8.0 10.0 9.0  Hemoglobin 12.0 - 15.0 g/dL 12.2 12.0 11.4(L)  Hematocrit 36.0 - 46.0 % 40.0 39.1 38.8  Platelets 150 - 400 K/uL 226 255 271    . CMP Latest Ref Rng & Units 02/15/2021 10/01/2020 08/08/2020  Glucose 70 - 99 mg/dL 97 116(H) 103(H)  BUN 8 - 23 mg/dL 16 16 15   Creatinine 0.44 - 1.00 mg/dL 0.80 0.71 0.86  Sodium 135 - 145 mmol/L 140 140 140  Potassium 3.5 - 5.1 mmol/L 4.3 4.4 4.1  Chloride 98 - 111 mmol/L 103 103 104  CO2 22 - 32 mmol/L 28 30 28   Calcium 8.9 - 10.3 mg/dL 10.0 10.0 9.3  Total Protein 6.5 - 8.1 g/dL 7.1 6.6 7.5  Total Bilirubin 0.3 - 1.2 mg/dL 0.4 0.2 0.4  Alkaline Phos 38 - 126 U/L 77 - 81  AST 15 - 41 U/L 15 13 13(L)  ALT 0 - 44 U/L 13 11 12    . Lab Results  Component Value Date   IRON 220 (H) 02/15/2021   TIBC 460 (H) 02/15/2021   IRONPCTSAT 48 02/15/2021   (Iron and TIBC)  Lab Results  Component Value Date   FERRITIN 10 (L) 02/15/2021     RADIOGRAPHIC STUDIES: I have personally reviewed the radiological images as listed and agreed with the findings in the report. No results found.  ASSESSMENT & PLAN:   65 yo female with   1) Iron deficiency Anemia Unclear etiology likely from GI losses. PLAN: -Discussed pt labwork today, 01/15/2021.  CBC within normal limits no anemia with MCV. -Ferritin slightly better extending up from 6.  Iron saturation is improved to 48% -Patient has no significant symptoms from her iron deficiency at this time. -Continue p.o. iron polysaccharide 150 mg p.o. twice daily -Recommended pt continue to f/u w Dr. Earlean Shawl regarding GI bleeding or stool testing.  -She continues to be on oral multivitamins and B12. -Continue to stay physically active. -Continue follow-up with PCP call us if worsening fatigue or anemia would recommend repeat  labs with PCP in 3 to 4 months  FOLLOW UP: RTC with Dr Irene Limbo as needed   The total time spent in the appointment was 20 minutes and more than 50% was on counseling and direct patient cares.   All of the patient's questions were answered with apparent satisfaction. The patient knows to call the clinic with any problems, questions or concerns.    Sullivan Lone MD [place] AAHIVMS [facility] Hematology/Oncology Physician [facility]

## 2021-02-22 NOTE — Patient Instructions (Addendum)
Whitney Herrera , Thank you for taking time to come for your Annual Wellness Visit. I appreciate your ongoing commitment to your health goals. Please review the following plan we discussed and let me know if I can assist you in the future.   These are the goals we discussed:  Goals      DIET - INCREASE WATER INTAKE     65+ fluid ounces of water or unsweet/clear liquids     Exercise 20-30 min daily (brisk walking)     Weight (lb) < 230 lb (104.3 kg)        This is a list of the screening recommended for you and due dates:  Health Maintenance  Topic Date Due   Pap Smear  Never done   Mammogram  11/30/2014   DEXA scan (bone density measurement)  01/24/2021   COVID-19 Vaccine (4 - Booster for Pfizer series) 03/16/2021   Tetanus Vaccine  06/19/2027   Colon Cancer Screening  04/26/2028   Flu Shot  Completed   Hepatitis C Screening: USPSTF Recommendation to screen - Ages 18-79 yo.  Completed   HIV Screening  Completed   Zoster (Shingles) Vaccine  Completed   HPV Vaccine  Aged Out      Try vitamin D with Vitamin K2  Weigh once a week and keep log  Try vyvanse -INSTEAD of phentermine - please do not take together. Take in the morning for focus and appetite. If this fails prior auth we will try adderall first instead.    High-Fiber Eating Plan Fiber, also called dietary fiber, is a type of carbohydrate. It is found foods such as fruits, vegetables, whole grains, and beans. A high-fiber diet can have many health benefits. Your health care provider may recommend a high-fiber diet to help: Prevent constipation. Fiber can make your bowel movements more regular. Lower your cholesterol. Relieve the following conditions: Inflammation of veins in the anus (hemorrhoids). Inflammation of specific areas of the digestive tract (uncomplicated diverticulosis). A problem of the large intestine, also called the colon, that sometimes causes pain and diarrhea (irritable bowel syndrome, or  IBS). Prevent overeating as part of a weight-loss plan. Prevent heart disease, type 2 diabetes, and certain cancers. What are tips for following this plan? Reading food labels  Check the nutrition facts label on food products for the amount of dietary fiber. Choose foods that have 5 grams of fiber or more per serving. The goals for recommended daily fiber intake include: Men (age 36 or younger): 34-38 g. Men (over age 66): 28-34 g. Women (age 1 or younger): 25-28 g. Women (over age 10): 22-25 g. Your daily fiber goal is _____________ g. Shopping Choose whole fruits and vegetables instead of processed forms, such as apple juice or applesauce. Choose a wide variety of high-fiber foods such as avocados, lentils, oats, and kidney beans. Read the nutrition facts label of the foods you choose. Be aware of foods with added fiber. These foods often have high sugar and sodium amounts per serving. Cooking Use whole-grain flour for baking and cooking. Cook with brown rice instead of white rice. Meal planning Start the day with a breakfast that is high in fiber, such as a cereal that contains 5 g of fiber or more per serving. Eat breads and cereals that are made with whole-grain flour instead of refined flour or white flour. Eat brown rice, bulgur wheat, or millet instead of white rice. Use beans in place of meat in soups, salads, and pasta dishes. Be  sure that half of the grains you eat each day are whole grains. General information You can get the recommended daily intake of dietary fiber by: Eating a variety of fruits, vegetables, grains, nuts, and beans. Taking a fiber supplement if you are not able to take in enough fiber in your diet. It is better to get fiber through food than from a supplement. Gradually increase how much fiber you consume. If you increase your intake of dietary fiber too quickly, you may have bloating, cramping, or gas. Drink plenty of water to help you digest  fiber. Choose high-fiber snacks, such as berries, raw vegetables, nuts, and popcorn. What foods should I eat? Fruits Berries. Pears. Apples. Oranges. Avocado. Prunes and raisins. Dried figs. Vegetables Sweet potatoes. Spinach. Kale. Artichokes. Cabbage. Broccoli. Cauliflower. Green peas. Carrots. Squash. Grains Whole-grain breads. Multigrain cereal. Oats and oatmeal. Brown rice. Barley. Bulgur wheat. [place]. Quinoa. Bran muffins. Popcorn. Rye wafer crackers. Meats and other proteins Navy beans, kidney beans, and pinto beans. Soybeans. Split peas. Lentils. Nuts and seeds. Dairy Fiber-fortified yogurt. Beverages Fiber-fortified soy milk. Fiber-fortified orange juice. Other foods Fiber bars. The items listed above may not be a complete list of recommended foods and beverages. Contact a dietitian for more information. What foods should I avoid? Fruits Fruit juice. Cooked, strained fruit. Vegetables Fried potatoes. Canned vegetables. Well-cooked vegetables. Grains White bread. Pasta made with refined flour. White rice. Meats and other proteins Fatty cuts of meat. Fried chicken or fried fish. Dairy Milk. Yogurt. Cream cheese. Sour cream. Fats and oils Butters. Beverages Soft drinks. Other foods Cakes and pastries. The items listed above may not be a complete list of foods and beverages to avoid. Talk with your dietitian about what choices are best for you. Summary Fiber is a type of carbohydrate. It is found in foods such as fruits, vegetables, whole grains, and beans. A high-fiber diet has many benefits. It can help to prevent constipation, lower blood cholesterol, aid weight loss, and reduce your risk of heart disease, diabetes, and certain cancers. Increase your intake of fiber gradually. Increasing fiber too quickly may cause cramping, bloating, and gas. Drink plenty of water while you increase the amount of fiber you consume. The best sources of fiber include whole fruits and  vegetables, whole grains, nuts, seeds, and beans. This information is not intended to replace advice given to you by your health care provider. Make sure you discuss any questions you have with your health care provider. Document Revised: 09/22/2019 Document Reviewed: 09/22/2019 Elsevier Patient Education  2022 [employer].

## 2021-02-22 NOTE — Telephone Encounter (Signed)
Contacted pt per Dr Irene Limbo to let pt know:  her CBC is within normal limits with no anemia.  Ferritin is still low at 10 but iron saturation has improved to 48%.  Please continue oral iron polysaccharide 150 mg p.o. twice daily and repeat CBC and iron labs with primary care physician in 3 to 4 months.  If worsening fatigue or anemia can call us for possible IV iron infusion.  Pt acknowledged and verbalized understanding.

## 2021-02-23 ENCOUNTER — Other Ambulatory Visit: Payer: Self-pay | Admitting: Adult Health

## 2021-02-23 LAB — COMPLETE METABOLIC PANEL WITH GFR
AG Ratio: 1.6 (calc) (ref 1.0–2.5)
ALT: 12 U/L (ref 6–29)
AST: 12 U/L (ref 10–35)
Albumin: 4.2 g/dL (ref 3.6–5.1)
Alkaline phosphatase (APISO): 76 U/L (ref 37–153)
BUN: 13 mg/dL (ref 7–25)
CO2: 32 mmol/L (ref 20–32)
Calcium: 10.4 mg/dL (ref 8.6–10.4)
Chloride: 102 mmol/L (ref 98–110)
Creat: 0.69 mg/dL (ref 0.50–1.05)
Globulin: 2.7 g/dL (calc) (ref 1.9–3.7)
Glucose, Bld: 83 mg/dL (ref 65–99)
Potassium: 4.5 mmol/L (ref 3.5–5.3)
Sodium: 140 mmol/L (ref 135–146)
Total Bilirubin: 0.3 mg/dL (ref 0.2–1.2)
Total Protein: 6.9 g/dL (ref 6.1–8.1)
eGFR: 96 mL/min/{1.73_m2} (ref >=60)

## 2021-02-23 LAB — CBC WITH DIFFERENTIAL/PLATELET
Absolute Monocytes: 577 cells/uL (ref 200–950)
Basophils Absolute: 70 cells/uL (ref 0–200)
Basophils Relative: 0.9 %
Eosinophils Absolute: 265 cells/uL (ref 15–500)
Eosinophils Relative: 3.4 %
HCT: 40.8 % (ref 35.0–45.0)
Hemoglobin: 12.8 g/dL (ref 11.7–15.5)
Lymphs Abs: 1872 cells/uL (ref 850–3900)
MCH: 27.5 pg (ref 27.0–33.0)
MCHC: 31.4 g/dL — ABNORMAL LOW (ref 32.0–36.0)
MCV: 87.6 fL (ref 80.0–100.0)
MPV: 11.4 fL (ref 7.5–12.5)
Monocytes Relative: 7.4 %
Neutro Abs: 5015 cells/uL (ref 1500–7800)
Neutrophils Relative %: 64.3 %
Platelets: 240 10*3/uL (ref 140–400)
RBC: 4.66 10*6/uL (ref 3.80–5.10)
RDW: 13.8 % (ref 11.0–15.0)
Total Lymphocyte: 24 %
WBC: 7.8 10*3/uL (ref 3.8–10.8)

## 2021-02-23 LAB — LIPID PANEL
Cholesterol: 163 mg/dL (ref <200)
HDL: 45 mg/dL — ABNORMAL LOW (ref >=50)
LDL Cholesterol (Calc): 85 mg/dL (calc)
Non-HDL Cholesterol (Calc): 118 mg/dL (calc) (ref <130)
Total CHOL/HDL Ratio: 3.6 (calc) (ref <5.0)
Triglycerides: 236 mg/dL — ABNORMAL HIGH (ref <150)

## 2021-02-23 LAB — VITAMIN B12: Vitamin B-12: 1472 pg/mL — ABNORMAL HIGH (ref 200–1100)

## 2021-02-23 LAB — MAGNESIUM: Magnesium: 2.2 mg/dL (ref 1.5–2.5)

## 2021-02-23 LAB — TSH: TSH: 4.02 mIU/L (ref 0.40–4.50)

## 2021-02-23 MED ORDER — B-12 2500 MCG SL SUBL: 1.0000 | SUBLINGUAL_TABLET | SUBLINGUAL | 0 refills | Status: DC

## 2021-03-06 ENCOUNTER — Encounter: Payer: Self-pay | Admitting: Internal Medicine

## 2021-04-01 ENCOUNTER — Encounter: Payer: Self-pay | Admitting: Internal Medicine

## 2021-04-08 ENCOUNTER — Encounter: Payer: Self-pay | Admitting: Internal Medicine

## 2021-05-27 ENCOUNTER — Encounter: Payer: Self-pay | Admitting: Internal Medicine

## 2021-05-27 NOTE — Progress Notes (Signed)
Future Appointments  Date Time Provider Department  05/28/2021         6 mo OV  10:30 AM Unk Pinto, MD [facility]  10/03/2021             CPE  11:00 AM Unk Pinto, MD [facility]  02/25/2022           Wellness 11:00 AM Liane Comber, NP [facility]    History of Present Illness:       This very nice 65 y.o. MWF presents for 6  month follow up with HTN, HLD, Pre-Diabetes and Vitamin D Deficiency. Patient has hx/o Osteoporosis followed by her GYN office.  She's followed by Dr Carolyne Fiscal for iron def anemia .        Patient is treated for HTN  (2005) & BP has been intermittently high at home. Todays BP was elevated at 160 /84  & rechecked at 152/88.   Patient has had no complaints of any cardiac type chest pain, palpitations, dyspnea / orthopnea / PND, dizziness, claudication, or dependent edema.       Hyperlipidemia is controlled with diet & rosuvastatin. Patient denies myalgias or other med SEs. Last Lipids were at goal except elevated Trig's :   Lab Results  Component Value Date   CHOL 163 02/22/2021   HDL 45 (L) 02/22/2021   LDLCALC 85 02/22/2021   TRIG 236 (H) 02/22/2021   CHOLHDL 3.6 02/22/2021     Also, the patient has history Morbid Obesity (BMI 44+ in Sept 2022) and consequent hx/o prediabetes (A1c 5.9% /2011 & 5.8% /2016) of PreDiabetes and has had no symptoms of reactive hypoglycemia, diabetic polys, paresthesias or visual blurring.  Last A1c was at goal :  Lab Results  Component Value Date   HGBA1C 5.5 10/01/2020                                                         Further, the patient also has history of Vitamin D Deficiency  ("20" /2008 & "34" /2013) and supplements vitamin D without any suspected side-effects. Last vitamin D was still low  (goal 70-100) :  Lab Results  Component Value Date   VD25OH 50 10/01/2020     Current Outpatient Medications on File Prior to Visit  Medication Sig   atenolol (TENORMIN) 100 MG tablet TAKE 1 TABLET DAILY    VITAMIN D 5,000 Units  Take  daily.    B-12 tab  2500 mcg SL Place 1 tablet under the tongue every other day.   LEXAPRO 20 MG tablet TAKE 1 TABLET DAILY    iron polysaccharides (NIFEREX) 150 MG c Take 1 capsule daily.   lisdexamfetamine (VYVANSE) 30 MG capsule Take 1 tab daily as needed for focus and binge eating. Do not take daily, ideally limit to 5 days/week.   Multiple Vitamins-Minerals Take 1 tablet daily   rosuvastatin  20 MG tablet Take 1 tablet Daily for Cholesterol      Allergies  Allergen Reactions   Lipitor [Atorvastatin]    Wellbutrin [Bupropion]     dysphoria     PMHx:   Past Medical History:  Diagnosis Date   B12 deficienc    Hyperlipidemia    Hypertension    Obesity    Prediabetes    Vitamin D deficiency  Immunization History  Administered Date(s) Administered   Influenza Inj Mdck Quad Pf 03/09/2019   Influenza, High Dose Seasonal PF 02/21/2021   Influenza-Unspecified 04/28/2015, 02/26/2016, 03/09/2019   PFIZER(Purple Top)SARS-COV-2 Vaccination 08/10/2019, 09/07/2019, 12/22/2020   PNEUMOCOCCAL CONJUGATE-20 02/22/2021   PPD Test 01/31/2014, 02/12/2015, 03/10/2016, 06/18/2017, 07/28/2019, 10/01/2020   Pneumococcal-Unspecified 06/17/2008   Td 04/18/2007   Tdap 06/18/2017   Zoster Recombinat (Shingrix) 07/30/2020, 10/19/2020     Past Surgical History:  Procedure Laterality Date   APPENDECTOMY     BREAST BIOPSY Right 1975   INGUINAL HERNIA REPAIR Bilateral age 58    FHx:    Reviewed / unchanged  SHx:    Reviewed / unchanged   Systems Review:  Constitutional: Denies fever, chills, wt changes, headaches, insomnia, fatigue, night sweats, change in appetite. Eyes: Denies redness, blurred vision, diplopia, discharge, itchy, watery eyes.  ENT: Denies discharge, congestion, post nasal drip, epistaxis, sore throat, earache, hearing loss, dental pain, tinnitus, vertigo, sinus pain, snoring.  CV: Denies chest pain, palpitations, irregular heartbeat,  syncope, dyspnea, diaphoresis, orthopnea, PND, claudication or edema. Respiratory: denies cough, dyspnea, DOE, pleurisy, hoarseness, laryngitis, wheezing.  Gastrointestinal: Denies dysphagia, odynophagia, heartburn, reflux, water brash, abdominal pain or cramps, nausea, vomiting, bloating, diarrhea, constipation, hematemesis, melena, hematochezia  or hemorrhoids. Genitourinary: Denies dysuria, frequency, urgency, nocturia, hesitancy, discharge, hematuria or flank pain. Musculoskeletal: Denies arthralgias, myalgias, stiffness, jt. swelling, pain, limping or strain/sprain.  Skin: Denies pruritus, rash, hives, warts, acne, eczema or change in skin lesion(s). Neuro: No weakness, tremor, incoordination, spasms, paresthesia or pain. Psychiatric: Denies confusion, memory loss or sensory loss. Endo: Denies change in weight, skin or hair change.  Heme/Lymph: No excessive bleeding, bruising or enlarged lymph nodes.  Physical Exam  BP (!) 160/84    Pulse 95    Temp 97.8 F (36.6 C)    Resp 17    Ht 5\' 2"  (1.575 m)    Wt 245 lb 6.4 oz (111.3 kg)    SpO2 96%    BMI 44.88 kg/m   Appears  over  nourished, well groomed  and in no distress.  Eyes: PERRLA, EOMs, conjunctiva no swelling or erythema. Sinuses: No frontal/maxillary tenderness ENT/Mouth: EAC's clear, TM's nl w/o erythema, bulging. Nares clear w/o erythema, swelling, exudates. Oropharynx clear without erythema or exudates. Oral hygiene is good. Tongue normal, non obstructing. Hearing intact.  Neck: Supple. Thyroid not palpable. Car 2+/2+ without bruits, nodes or JVD. Chest: Respirations nl with BS clear & equal w/o rales, rhonchi, wheezing or stridor.  Cor: Heart sounds normal w/ regular rate and rhythm without sig. murmurs, gallops, clicks or rubs. Peripheral pulses normal and equal  without edema.  Abdomen: Soft & bowel sounds normal. Non-tender w/o guarding, rebound, hernias, masses or organomegaly.  Lymphatics: Unremarkable.  Musculoskeletal:  Full ROM all peripheral extremities, joint stability, 5/5 strength and normal gait.  Skin: Warm, dry without exposed rashes, lesions or ecchymosis apparent.  Neuro: Cranial nerves intact, reflexes equal bilaterally. Sensory-motor testing grossly intact. Tendon reflexes grossly intact.  Pysch: Alert & oriented x 3.  Insight and judgement nl & appropriate. No ideations.  Assessment and Plan:   1. Essential hypertension  - Add Olmesartan 20 mg - 1/2 to 1 tab qhs for BP   -  monitor blood pressure at home.  - Continue DASH diet.  Reminder to go to the ER if any CP,  SOB, nausea, dizziness, severe HA, changes vision/speech.    - CBC with Differential/Platelet - COMPLETE METABOLIC PANEL WITH GFR - Lipid  panel - TSH - Hemoglobin A1c  2. Hyperlipidemia, mixed   Continue diet/meds, exercise,& lifestyle modifications.  - Continue monitor periodic cholesterol/liver & renal functions   - Lipid panel - TSH  3. Abnormal glucose  - Continue diet, exercise  - Lifestyle modifications.  - Monitor appropriate labs  - Insulin, random  4. B12 deficiency  - Vitamin B12  5. Vitamin D deficiency  - Continue supplementation   - VITAMIN D 25 Hydroxy  6. Morbid obesity with body mass index (BMI) of 40.0 to 44.9 in adult (HCC)  - TSH  7. Medication management  - CBC with Differential/Platelet - COMPLETE METABOLIC PANEL WITH GFR - Lipid panel - TSH - Hemoglobin A1c - Insulin, random - VITAMIN D 25 Hydroxy  - Vitamin B12  8. Cough headache  - POC COVID-19         Discussed  regular exercise, BP monitoring, weight control to achieve/maintain BMI less than 25 and discussed med and SE's. Recommended labs to assess and monitor clinical status with further disposition pending results of labs.  I discussed the assessment and treatment plan with the patient. The patient was provided an opportunity to ask questions and all were answered. The patient agreed with the plan and  demonstrated an understanding of the instructions.  I provided over 30 minutes of exam, counseling, chart review and  complex critical decision making.        The patient was advised to call back or seek an in-person evaluation if the symptoms worsen or if the condition fails to improve as anticipated.   Kirtland Bouchard, MD

## 2021-05-28 ENCOUNTER — Other Ambulatory Visit: Payer: Self-pay

## 2021-05-28 ENCOUNTER — Encounter: Payer: Self-pay | Admitting: Internal Medicine

## 2021-05-28 ENCOUNTER — Ambulatory Visit: Payer: Medicare PPO | Admitting: Internal Medicine

## 2021-05-28 VITALS — BP 160/84 | HR 95 | Temp 97.8°F | Resp 17 | Ht 62.0 in | Wt 245.4 lb

## 2021-05-28 DIAGNOSIS — G4483 Primary cough headache: Secondary | ICD-10-CM

## 2021-05-28 DIAGNOSIS — Z79899 Other long term (current) drug therapy: Secondary | ICD-10-CM

## 2021-05-28 DIAGNOSIS — Z6841 Body Mass Index (BMI) 40.0 and over, adult: Secondary | ICD-10-CM | POA: Diagnosis not present

## 2021-05-28 DIAGNOSIS — I10 Essential (primary) hypertension: Secondary | ICD-10-CM | POA: Diagnosis not present

## 2021-05-28 DIAGNOSIS — E538 Deficiency of other specified B group vitamins: Secondary | ICD-10-CM | POA: Diagnosis not present

## 2021-05-28 DIAGNOSIS — E559 Vitamin D deficiency, unspecified: Secondary | ICD-10-CM | POA: Diagnosis not present

## 2021-05-28 DIAGNOSIS — R7309 Other abnormal glucose: Secondary | ICD-10-CM | POA: Diagnosis not present

## 2021-05-28 DIAGNOSIS — E782 Mixed hyperlipidemia: Secondary | ICD-10-CM | POA: Diagnosis not present

## 2021-05-28 LAB — POC COVID19 BINAXNOW: SARS Coronavirus 2 Ag: NEGATIVE

## 2021-05-28 MED ORDER — OLMESARTAN MEDOXOMIL 20 MG PO TABS: ORAL_TABLET | ORAL | 3 refills | Status: DC

## 2021-05-28 NOTE — Patient Instructions (Signed)

## 2021-05-29 ENCOUNTER — Other Ambulatory Visit: Payer: Self-pay | Admitting: Internal Medicine

## 2021-05-29 LAB — CBC WITH DIFFERENTIAL/PLATELET
Absolute Monocytes: 462 cells/uL (ref 200–950)
Basophils Absolute: 49 cells/uL (ref 0–200)
Basophils Relative: 0.7 %
Eosinophils Absolute: 252 cells/uL (ref 15–500)
Eosinophils Relative: 3.6 %
HCT: 40.1 % (ref 35.0–45.0)
Hemoglobin: 12.8 g/dL (ref 11.7–15.5)
Lymphs Abs: 1673 cells/uL (ref 850–3900)
MCH: 27.8 pg (ref 27.0–33.0)
MCHC: 31.9 g/dL — ABNORMAL LOW (ref 32.0–36.0)
MCV: 87.2 fL (ref 80.0–100.0)
MPV: 11.3 fL (ref 7.5–12.5)
Monocytes Relative: 6.6 %
Neutro Abs: 4564 cells/uL (ref 1500–7800)
Neutrophils Relative %: 65.2 %
Platelets: 220 10*3/uL (ref 140–400)
RBC: 4.6 10*6/uL (ref 3.80–5.10)
RDW: 13.1 % (ref 11.0–15.0)
Total Lymphocyte: 23.9 %
WBC: 7 10*3/uL (ref 3.8–10.8)

## 2021-05-29 LAB — COMPLETE METABOLIC PANEL WITH GFR
AG Ratio: 1.5 (calc) (ref 1.0–2.5)
ALT: 11 U/L (ref 6–29)
AST: 12 U/L (ref 10–35)
Albumin: 4 g/dL (ref 3.6–5.1)
Alkaline phosphatase (APISO): 72 U/L (ref 37–153)
BUN: 19 mg/dL (ref 7–25)
CO2: 29 mmol/L (ref 20–32)
Calcium: 9.4 mg/dL (ref 8.6–10.4)
Chloride: 104 mmol/L (ref 98–110)
Creat: 0.66 mg/dL (ref 0.50–1.05)
Globulin: 2.6 g/dL (calc) (ref 1.9–3.7)
Glucose, Bld: 92 mg/dL (ref 65–99)
Potassium: 4.6 mmol/L (ref 3.5–5.3)
Sodium: 141 mmol/L (ref 135–146)
Total Bilirubin: 0.3 mg/dL (ref 0.2–1.2)
Total Protein: 6.6 g/dL (ref 6.1–8.1)
eGFR: 97 mL/min/{1.73_m2} (ref >=60)

## 2021-05-29 LAB — LIPID PANEL
Cholesterol: 202 mg/dL — ABNORMAL HIGH (ref <200)
HDL: 48 mg/dL — ABNORMAL LOW (ref >=50)
LDL Cholesterol (Calc): 123 mg/dL (calc) — ABNORMAL HIGH
Non-HDL Cholesterol (Calc): 154 mg/dL (calc) — ABNORMAL HIGH (ref <130)
Total CHOL/HDL Ratio: 4.2 (calc) (ref <5.0)
Triglycerides: 184 mg/dL — ABNORMAL HIGH (ref <150)

## 2021-05-29 LAB — TSH: TSH: 1.94 mIU/L (ref 0.40–4.50)

## 2021-05-29 LAB — HEMOGLOBIN A1C
Hgb A1c MFr Bld: 5.9 % of total Hgb — ABNORMAL HIGH (ref <5.7)
Mean Plasma Glucose: 123 mg/dL
eAG (mmol/L): 6.8 mmol/L

## 2021-05-29 LAB — VITAMIN B12: Vitamin B-12: 398 pg/mL (ref 200–1100)

## 2021-05-29 LAB — INSULIN, RANDOM: Insulin: 14.5 u[IU]/mL

## 2021-05-29 LAB — VITAMIN D 25 HYDROXY (VIT D DEFICIENCY, FRACTURES): Vit D, 25-Hydroxy: 52 ng/mL (ref 30–100)

## 2021-08-12 ENCOUNTER — Other Ambulatory Visit: Payer: Self-pay | Admitting: Internal Medicine

## 2021-08-12 DIAGNOSIS — R7303 Prediabetes: Secondary | ICD-10-CM

## 2021-10-03 ENCOUNTER — Ambulatory Visit (INDEPENDENT_AMBULATORY_CARE_PROVIDER_SITE_OTHER): Payer: Medicare PPO | Admitting: Internal Medicine

## 2021-10-03 ENCOUNTER — Encounter: Payer: Self-pay | Admitting: Internal Medicine

## 2021-10-03 VITALS — BP 136/80 | HR 56 | Temp 97.9°F | Resp 16 | Ht 62.0 in | Wt 229.4 lb

## 2021-10-03 DIAGNOSIS — M3500 Sicca syndrome, unspecified: Secondary | ICD-10-CM

## 2021-10-03 DIAGNOSIS — Z0000 Encounter for general adult medical examination without abnormal findings: Secondary | ICD-10-CM

## 2021-10-03 DIAGNOSIS — Z1211 Encounter for screening for malignant neoplasm of colon: Secondary | ICD-10-CM | POA: Diagnosis not present

## 2021-10-03 DIAGNOSIS — Z1212 Encounter for screening for malignant neoplasm of rectum: Secondary | ICD-10-CM | POA: Diagnosis not present

## 2021-10-03 DIAGNOSIS — E782 Mixed hyperlipidemia: Secondary | ICD-10-CM | POA: Diagnosis not present

## 2021-10-03 DIAGNOSIS — R7303 Prediabetes: Secondary | ICD-10-CM

## 2021-10-03 DIAGNOSIS — R7309 Other abnormal glucose: Secondary | ICD-10-CM

## 2021-10-03 DIAGNOSIS — Z79899 Other long term (current) drug therapy: Secondary | ICD-10-CM

## 2021-10-03 DIAGNOSIS — D509 Iron deficiency anemia, unspecified: Secondary | ICD-10-CM

## 2021-10-03 DIAGNOSIS — E785 Hyperlipidemia, unspecified: Secondary | ICD-10-CM

## 2021-10-03 DIAGNOSIS — Z0001 Encounter for general adult medical examination with abnormal findings: Secondary | ICD-10-CM

## 2021-10-03 DIAGNOSIS — E559 Vitamin D deficiency, unspecified: Secondary | ICD-10-CM

## 2021-10-03 DIAGNOSIS — M810 Age-related osteoporosis without current pathological fracture: Secondary | ICD-10-CM

## 2021-10-03 DIAGNOSIS — Z8249 Family history of ischemic heart disease and other diseases of the circulatory system: Secondary | ICD-10-CM

## 2021-10-03 DIAGNOSIS — E1122 Type 2 diabetes mellitus with diabetic chronic kidney disease: Secondary | ICD-10-CM

## 2021-10-03 DIAGNOSIS — Z136 Encounter for screening for cardiovascular disorders: Secondary | ICD-10-CM

## 2021-10-03 DIAGNOSIS — I10 Essential (primary) hypertension: Secondary | ICD-10-CM

## 2021-10-03 MED ORDER — PHENTERMINE HCL 37.5 MG PO TABS: ORAL_TABLET | ORAL | 1 refills | Status: DC

## 2021-10-03 MED ORDER — TOPIRAMATE 50 MG PO TABS: ORAL_TABLET | ORAL | 3 refills | Status: DC

## 2021-10-03 MED ORDER — METFORMIN HCL ER 500 MG PO TB24: ORAL_TABLET | ORAL | 3 refills | Status: DC

## 2021-10-03 NOTE — Patient Instructions (Signed)

## 2021-10-03 NOTE — Progress Notes (Signed)
? ?Annual Screening/Preventative Visit &  ?Comprehensive Evaluation &  Examination ? ?Future Appointments  ?Date Time Provider Department  ?10/03/2021 11:00 AM Unk Pinto, MD [facility]  ?02/25/2022 11:00 AM Liane Comber, NP [facility]  ? ? ?    This very nice 66 y.o. MWF presents for a Screening /Preventative Visit & comprehensive evaluation and management of multiple medical co-morbidities.  Patient has been followed for HTN, HLD, T2_NIDDM  Prediabetes  and Vitamin D Deficiency. Patient has Morbid Obesity (BMI 40+) . Patient has hx/o Osteoporosis followed by her GYN office.  She's also followed by Dr Carolyne Fiscal for iron def anemia .  Patient has been dx'd with Sjogren's Syndrome  for oral xerostomia and had seen Rheumatologist  - Dr Kathlene November in the past.   ?  ? ?     HTN predates since  2005. Patient's BP has been controlled at home  on Atenolol 100 mg x 1/4 qam  and Olmesartan 40 mg x 1/2 qpm and patient denies any cardiac symptoms as chest pain, palpitations, shortness of breath, dizziness or ankle swelling. Today's BP is at goal  136/80  ? ? ?    Patient's hyperlipidemia is controlled with diet and medications. Patient denies myalgias or other medication SE's. Last lipids were not at goal : ? ?Lab Results  ?Component Value Date  ? CHOL 202 (H) 05/28/2021  ? HDL 48 (L) 05/28/2021  ? LDLCALC 123 (H) 05/28/2021  ? TRIG 184 (H) 05/28/2021  ? CHOLHDL 4.2 05/28/2021  ? ? ? ?    Patient has  Morbid Obesity (BMI 44+ /Feb 2021) and consequent hx/o prediabetes (A1c 5.9% /2011 & 5.8% /2016) and patient denies reactive hypoglycemic symptoms, visual blurring, diabetic polys or paresthesias. Last A1c was not at goal: ? ?Lab Results  ?Component Value Date  ? HGBA1C 5.9 (H) 05/28/2021  ? ? ? ?    Finally, patient has history of Vitamin D Deficiency ("20" /2008 & "34" /2013) and last Vitamin D was  not at goal (70-100) : ? ?Lab Results  ?Component Value Date  ? VD25OH 52 05/28/2021  ? ? ? ?Current Outpatient Medications on  File Prior to Visit  ?Medication Sig  ? atenolol 100 MG tablet TAKE 1 TABLET DAILY   ? VITAMIN D   5,000 Units Take  daily.   ? Vitamin B-12 2500 MCG SL Place 1 tablet under the tongue every other day.  ? LEXAPRO 20 MG tablet TAKE 1 TABLET DAILY   ? NIFEREX  150 MG Take 1 capsule  daily.  ? Multiple Vitamins-Minerals  Take 1 tablet  daily. With iron and B vitamins  ? olmesartan 20 MG tablet Take  1/2 to 1 tablet  at night   ? rosuvastatin  20 MG tablet Take 1 tablet Daily f  ? ? ? ?Allergies  ?Allergen Reactions  ? Lipitor [Atorvastatin]   ? Wellbutrin [Bupropion]   ?  dysphoria  ? ? ? ?Past Medical History:  ?Diagnosis Date  ? B12 deficiency   ? Prediabetes   ? Vitamin D deficiency   ? ? ? ?Health Maintenance  ?Topic Date Due  ? PAP SMEAR-Modifier  Never done  ? MAMMOGRAM  06/25/2020  ? COVID-19 Vaccine (4 - Booster for Pfizer series) 02/16/2021  ? INFLUENZA VACCINE  12/31/2021  ? TETANUS/TDAP  06/19/2027  ? Pneumonia Vaccine 54+ Years old  Completed  ? DEXA SCAN  Completed  ? Hepatitis C Screening  Completed  ? HIV Screening  Completed  ?  Zoster Vaccines- Shingrix  Completed  ? HPV VACCINES  Aged Out  ? ? ? ?Immunization History  ?Administered Date(s) Administered  ? Influenza Inj Mdck Quad 03/09/2019  ? Influenza, High Dose 02/21/2021  ? Influenza 04/28/2015, 02/26/2016, 03/09/2019  ? PFIZER-SARS-COV-2 Vacc 08/10/2019, 09/07/2019, 12/22/2020  ? PNEUMOCOCCAL -20 02/22/2021  ? PPD Test 06/18/2017, 07/28/2019, 10/01/2020  ? Pneumococcal-23 06/17/2008  ? Td 04/18/2007  ? Tdap 06/18/2017  ? Zoster Recombinat (Shingrix) 07/30/2020, 10/19/2020  ? ? ?Last Colon & EGD  - Nov 2019 - Dr Earlean Shawl - Negative - Recc 10 yr f/u due Nov 2029 ? ? ?Last MGM -  Last MGM  & dexaBMD 06/24/2018 at [facility] ?  ? ? ?Past Surgical History:  ?Procedure Laterality Date  ? APPENDECTOMY    ? BREAST BIOPSY Right 1975  ? INGUINAL HERNIA REPAIR Bilateral age 32  ? ? ? ?Family History  ?Problem Relation Age of Onset  ? Hypertension  Mother   ? Alzheimer's disease Mother   ? Hypertension Father   ? Alzheimer's disease Father   ? ? ? ?Social History  ? ?Tobacco Use  ? Smoking status: Never  ? Smokeless tobacco: Never  ?Substance Use Topics  ? Alcohol use: Yes  ?  Alcohol/week: 2.0 standard drinks  ?  Types: 2 Standard drinks or equivalent per week  ?  Comment: wine  ? Drug use: No  ? ? ? ? ROS ?Constitutional: Denies fever, chills, weight loss/gain, headaches, insomnia,  night sweats, and change in appetite. Does c/o fatigue. ?Eyes: Denies redness, blurred vision, diplopia, discharge, itchy, watery eyes.  ?ENT: Denies discharge, congestion, post nasal drip, epistaxis, sore throat, earache, hearing loss, dental pain, Tinnitus, Vertigo, Sinus pain, snoring.  ?Cardio: Denies chest pain, palpitations, irregular heartbeat, syncope, dyspnea, diaphoresis, orthopnea, PND, claudication, edema ?Respiratory: denies cough, dyspnea, DOE, pleurisy, hoarseness, laryngitis, wheezing.  ?Gastrointestinal: Denies dysphagia, heartburn, reflux, water brash, pain, cramps, nausea, vomiting, bloating, diarrhea, constipation, hematemesis, melena, hematochezia, jaundice, hemorrhoids ?Genitourinary: Denies dysuria, frequency, urgency, nocturia, hesitancy, discharge, hematuria, flank pain ?Breast: Breast lumps, nipple discharge, bleeding.  ?Musculoskeletal: Denies arthralgia, myalgia, stiffness, Jt. Swelling, pain, limp, and strain/sprain. Denies falls. ?Skin: Denies puritis, rash, hives, warts, acne, eczema, changing in skin lesion ?Neuro: No weakness, tremor, incoordination, spasms, paresthesia, pain ?Psychiatric: Denies confusion, memory loss, sensory loss. Denies Depression. ?Endocrine: Denies change in weight, skin, hair change, nocturia, and paresthesia, diabetic polys, visual blurring, hyper / hypo glycemic episodes.  ?Heme/Lymph: No excessive bleeding, bruising, enlarged lymph nodes. ? ?Physical Exam ? ?BP 136/80   Pulse (!) 56   Temp 97.9 ?F (36.6 ?C)   Resp 16    Ht '5\' 2"'$  (1.575 m)   Wt 229 lb 6.4 oz (104.1 kg)   SpO2 96%   BMI 41.96 kg/m?  ? ?General Appearance:  Over nourished, well groomed and in no apparent distress. ? ?Eyes: PERRLA, EOMs, conjunctiva no swelling or erythema, normal fundi and vessels. ?Sinuses: No frontal/maxillary tenderness ?ENT/Mouth: EACs patent / TMs  nl. Nares clear without erythema, swelling, mucoid exudates. Oral hygiene is good. No erythema, swelling, or exudate. Tongue normal, non-obstructing. Tonsils not swollen or erythematous. Hearing normal.  ?Neck: Supple, thyroid not palpable. No bruits, nodes or JVD. ?Respiratory: Respiratory effort normal.  BS equal and clear bilateral without rales, rhonci, wheezing or stridor. ?Cardio: Heart sounds are normal with regular rate and rhythm and no murmurs, rubs or gallops. Peripheral pulses are normal and equal bilaterally without edema. No aortic or femoral bruits. ?Chest:  symmetric with normal excursions and percussion. ?Breasts: Symmetric, without lumps, nipple discharge, retractions, or fibrocystic changes.  ?Abdomen: Flat, soft with bowel sounds active. Nontender, no guarding, rebound, hernias, masses, or organomegaly.  ?Lymphatics: Non tender without lymphadenopathy.  ?Musculoskeletal: Full ROM all peripheral extremities, joint stability, 5/5 strength, and normal gait. ?Skin: Warm and dry without rashes, lesions, cyanosis, clubbing or  ecchymosis.  ?Neuro: Cranial nerves intact, reflexes equal bilaterally. Normal muscle tone, no cerebellar symptoms. Sensation intact.  ?Pysch: Alert and oriented X 3, normal affect, Insight and Judgment appropriate.  ? ? ?Assessment and Plan ? ?1. Annual Preventative Screening Examination ? ? ?2. Essential hypertension ? ?- EKG 12-Lead ?- Microalbumin / creatinine urine ratio ?- CBC with Differential/Platelet ?- COMPLETE METABOLIC PANEL WITH GFR ?- Magnesium ?- TSH ?- Urinalysis, Routine w reflex microscopic ? ?3. Hyperlipidemia associated with type 2  diabetes  mellitus  ([place])  ? ?- EKG 12-Lead ?- Lipid panel ?- TSH ? ?4. Abnormal glucose ? ?- EKG 12-Lead ?- Hemoglobin A1c ?- Insulin, random ? ?5. Vitamin D deficiency ? ?- VITAMIN D 25 Hydroxy  ? ?6. Screening for c

## 2021-10-04 LAB — COMPLETE METABOLIC PANEL WITH GFR
AG Ratio: 1.5 (calc) (ref 1.0–2.5)
ALT: 11 U/L (ref 6–29)
AST: 15 U/L (ref 10–35)
Albumin: 4.2 g/dL (ref 3.6–5.1)
Alkaline phosphatase (APISO): 75 U/L (ref 37–153)
BUN: 20 mg/dL (ref 7–25)
CO2: 28 mmol/L (ref 20–32)
Calcium: 10 mg/dL (ref 8.6–10.4)
Chloride: 103 mmol/L (ref 98–110)
Creat: 0.68 mg/dL (ref 0.50–1.05)
Globulin: 2.8 g/dL (calc) (ref 1.9–3.7)
Glucose, Bld: 93 mg/dL (ref 65–99)
Potassium: 4.7 mmol/L (ref 3.5–5.3)
Sodium: 141 mmol/L (ref 135–146)
Total Bilirubin: 0.4 mg/dL (ref 0.2–1.2)
Total Protein: 7 g/dL (ref 6.1–8.1)
eGFR: 97 mL/min/{1.73_m2} (ref >=60)

## 2021-10-04 LAB — CBC WITH DIFFERENTIAL/PLATELET
Absolute Monocytes: 490 cells/uL (ref 200–950)
Basophils Absolute: 50 cells/uL (ref 0–200)
Basophils Relative: 0.7 %
Eosinophils Absolute: 252 cells/uL (ref 15–500)
Eosinophils Relative: 3.5 %
HCT: 39.8 % (ref 35.0–45.0)
Hemoglobin: 12.3 g/dL (ref 11.7–15.5)
Lymphs Abs: 1728 cells/uL (ref 850–3900)
MCH: 26.8 pg — ABNORMAL LOW (ref 27.0–33.0)
MCHC: 30.9 g/dL — ABNORMAL LOW (ref 32.0–36.0)
MCV: 86.7 fL (ref 80.0–100.0)
MPV: 11.8 fL (ref 7.5–12.5)
Monocytes Relative: 6.8 %
Neutro Abs: 4680 cells/uL (ref 1500–7800)
Neutrophils Relative %: 65 %
Platelets: 228 10*3/uL (ref 140–400)
RBC: 4.59 10*6/uL (ref 3.80–5.10)
RDW: 13.8 % (ref 11.0–15.0)
Total Lymphocyte: 24 %
WBC: 7.2 10*3/uL (ref 3.8–10.8)

## 2021-10-04 LAB — URINALYSIS, ROUTINE W REFLEX MICROSCOPIC
Bilirubin Urine: NEGATIVE
Glucose, UA: NEGATIVE
Hgb urine dipstick: NEGATIVE
Ketones, ur: NEGATIVE
Leukocytes,Ua: NEGATIVE
Nitrite: NEGATIVE
Protein, ur: NEGATIVE
Specific Gravity, Urine: 1.023 (ref 1.001–1.035)
pH: 6 (ref 5.0–8.0)

## 2021-10-04 LAB — LIPID PANEL
Cholesterol: 166 mg/dL (ref <200)
HDL: 49 mg/dL — ABNORMAL LOW (ref >=50)
LDL Cholesterol (Calc): 91 mg/dL (calc)
Non-HDL Cholesterol (Calc): 117 mg/dL (calc) (ref <130)
Total CHOL/HDL Ratio: 3.4 (calc) (ref <5.0)
Triglycerides: 163 mg/dL — ABNORMAL HIGH (ref <150)

## 2021-10-04 LAB — MICROALBUMIN / CREATININE URINE RATIO
Creatinine, Urine: 111 mg/dL (ref 20–275)
Microalb Creat Ratio: 2 mcg/mg creat (ref <30)
Microalb, Ur: 0.2 mg/dL

## 2021-10-04 LAB — IRON, TOTAL/TOTAL IRON BINDING CAP
%SAT: 30 % (calc) (ref 16–45)
Iron: 147 ug/dL (ref 45–160)
TIBC: 485 mcg/dL (calc) — ABNORMAL HIGH (ref 250–450)

## 2021-10-04 LAB — HEMOGLOBIN A1C
Hgb A1c MFr Bld: 5.6 % of total Hgb (ref <5.7)
Mean Plasma Glucose: 114 mg/dL
eAG (mmol/L): 6.3 mmol/L

## 2021-10-04 LAB — VITAMIN D 25 HYDROXY (VIT D DEFICIENCY, FRACTURES): Vit D, 25-Hydroxy: 48 ng/mL (ref 30–100)

## 2021-10-04 LAB — INSULIN, RANDOM: Insulin: 17.7 u[IU]/mL

## 2021-10-04 LAB — MAGNESIUM: Magnesium: 2.2 mg/dL (ref 1.5–2.5)

## 2021-10-04 LAB — TSH: TSH: 2.78 mIU/L (ref 0.40–4.50)

## 2021-10-04 NOTE — Progress Notes (Signed)
<><><><><><><><><><><><><><><><><><><><><><><><><><><><><><><><><> ?<><><><><><><><><><><><><><><><><><><><><><><><><><><><><><><><><> ?-   Test results slightly outside the reference range are not unusual. ?If there is anything important, I will review this with you,  ?otherwise it is considered normal test values.  ?If you have further questions,  ?please do not hesitate to contact me at the office or via My Chart.  ?<><><><><><><><><><><><><><><><><><><><><><><><><><><><><><><><><> ?<><><><><><><><><><><><><><><><><><><><><><><><><><><><><><><><><> ? ?-  Iron level is Normal & OK  ?<><><><><><><><><><><><><><><><><><><><><><><><><><><><><><><><><> ? ?-  Total Chol = 166      &     LDL Chol = 91      -   both      GREAT  !  ? ?- Very low risk for Heart Attack  / Stroke ?============================================================ ?============================================================ ? ?-  Vitamin D = 48  - is very Low  ? ?- Vitamin D goal is between 70-100.  ?fffffffffffffffffffffffffffffffffffffffffffffffffffffffffffffffffffffffffffffffffffffffffffffffffffffffffffffffffffffffffffff ? ?- Please INCREASE your Vitamin D 5,000 units to  ?                                                              2 capsules = 10,000 units  /day  ? ?ffffffffffffffffffffffffffffffffffffffffffffffffffffffffffffffffffffffffffffffffffffffffffffffffffffffffffffffffffffffffffffff ? ?- Vitamin D  is very important as a natural anti-inflammatory and helping the  ?immune system protect against viral infections, like the Covid-19  ? ? ?helping hair, skin, and nails, as well as reducing stroke and  ?heart attack risk.  ? ?- It helps your bones and helps with mood. ? ?- It also decreases numerous cancer risks so please  ?take it as directed.  ? ?- Low Vit D is associated with a 200-300% higher risk for  ?CANCER  ? ?and 200-300% higher risk for HEART   ATTACK  &  STROKE.   ? ?- It is also associated with higher death rate at younger ages,   ? ?autoimmune diseases like Rheumatoid arthritis, Lupus,  ?Multiple Sclerosis.    ? ?- Also many other serious conditions, like depression, Alzheimer's ? ?Dementia, infertility, muscle aches, fatigue, fibromyalgia  ? ?- just to name a few. ?<><><><><><><><><><><><><><><><><><><><><><><><><><><><><><><><><> ? ?-  A1c  is back in Normal Non- Diabetic range - Wonderful  ! ?<><><><><><><><><><><><><><><><><><><><><><><><><><><><><><><><><> ? ?-  All Else - CBC - Kidneys - Electrolytes - Liver - Magnesium & Thyroid   ? ?- all  Normal / OK ?<><><><><><><><><><><><><><><><><><><><><><><><><><><><><><><><><> ?<><><><><><><><><><><><><><><><><><><><><><><><><><><><><><><><><> ? ?- Keep up there  Great Work  ! ? ?<><><><><><><><><><><><><><><><><><><><><><><><><><><><><><><><><> ?<><><><><><><><><><><><><><><><><><><><><><><><><><><><><><><><><> ? ? ? ? ? ? ? ? ? ? ? ? ? ? ? ? ? ? ? ? ? ? ?

## 2021-10-17 ENCOUNTER — Other Ambulatory Visit: Payer: Self-pay | Admitting: Internal Medicine

## 2021-10-17 DIAGNOSIS — Z1231 Encounter for screening mammogram for malignant neoplasm of breast: Secondary | ICD-10-CM

## 2021-10-24 ENCOUNTER — Ambulatory Visit
Admission: RE | Admit: 2021-10-24 | Discharge: 2021-10-24 | Disposition: A | Discharge status: Home / Self Care | Payer: Medicare PPO | Source: Ambulatory Visit | Attending: Internal Medicine | Admitting: Internal Medicine

## 2021-10-24 DIAGNOSIS — Z1231 Encounter for screening mammogram for malignant neoplasm of breast: Secondary | ICD-10-CM

## 2021-11-06 ENCOUNTER — Other Ambulatory Visit: Payer: Self-pay | Admitting: Adult Health

## 2021-11-14 ENCOUNTER — Other Ambulatory Visit: Payer: Self-pay | Admitting: Internal Medicine

## 2021-11-14 DIAGNOSIS — F419 Anxiety disorder, unspecified: Secondary | ICD-10-CM

## 2021-12-16 IMAGING — CR DG CHEST 2V
2 series · 2 of 2 positions shown · non-contrast
Comparison: 10/01/2011

CLINICAL DATA: Dyspnea on exertion

EXAM:
CHEST - 2 VIEW

[w chest pa]
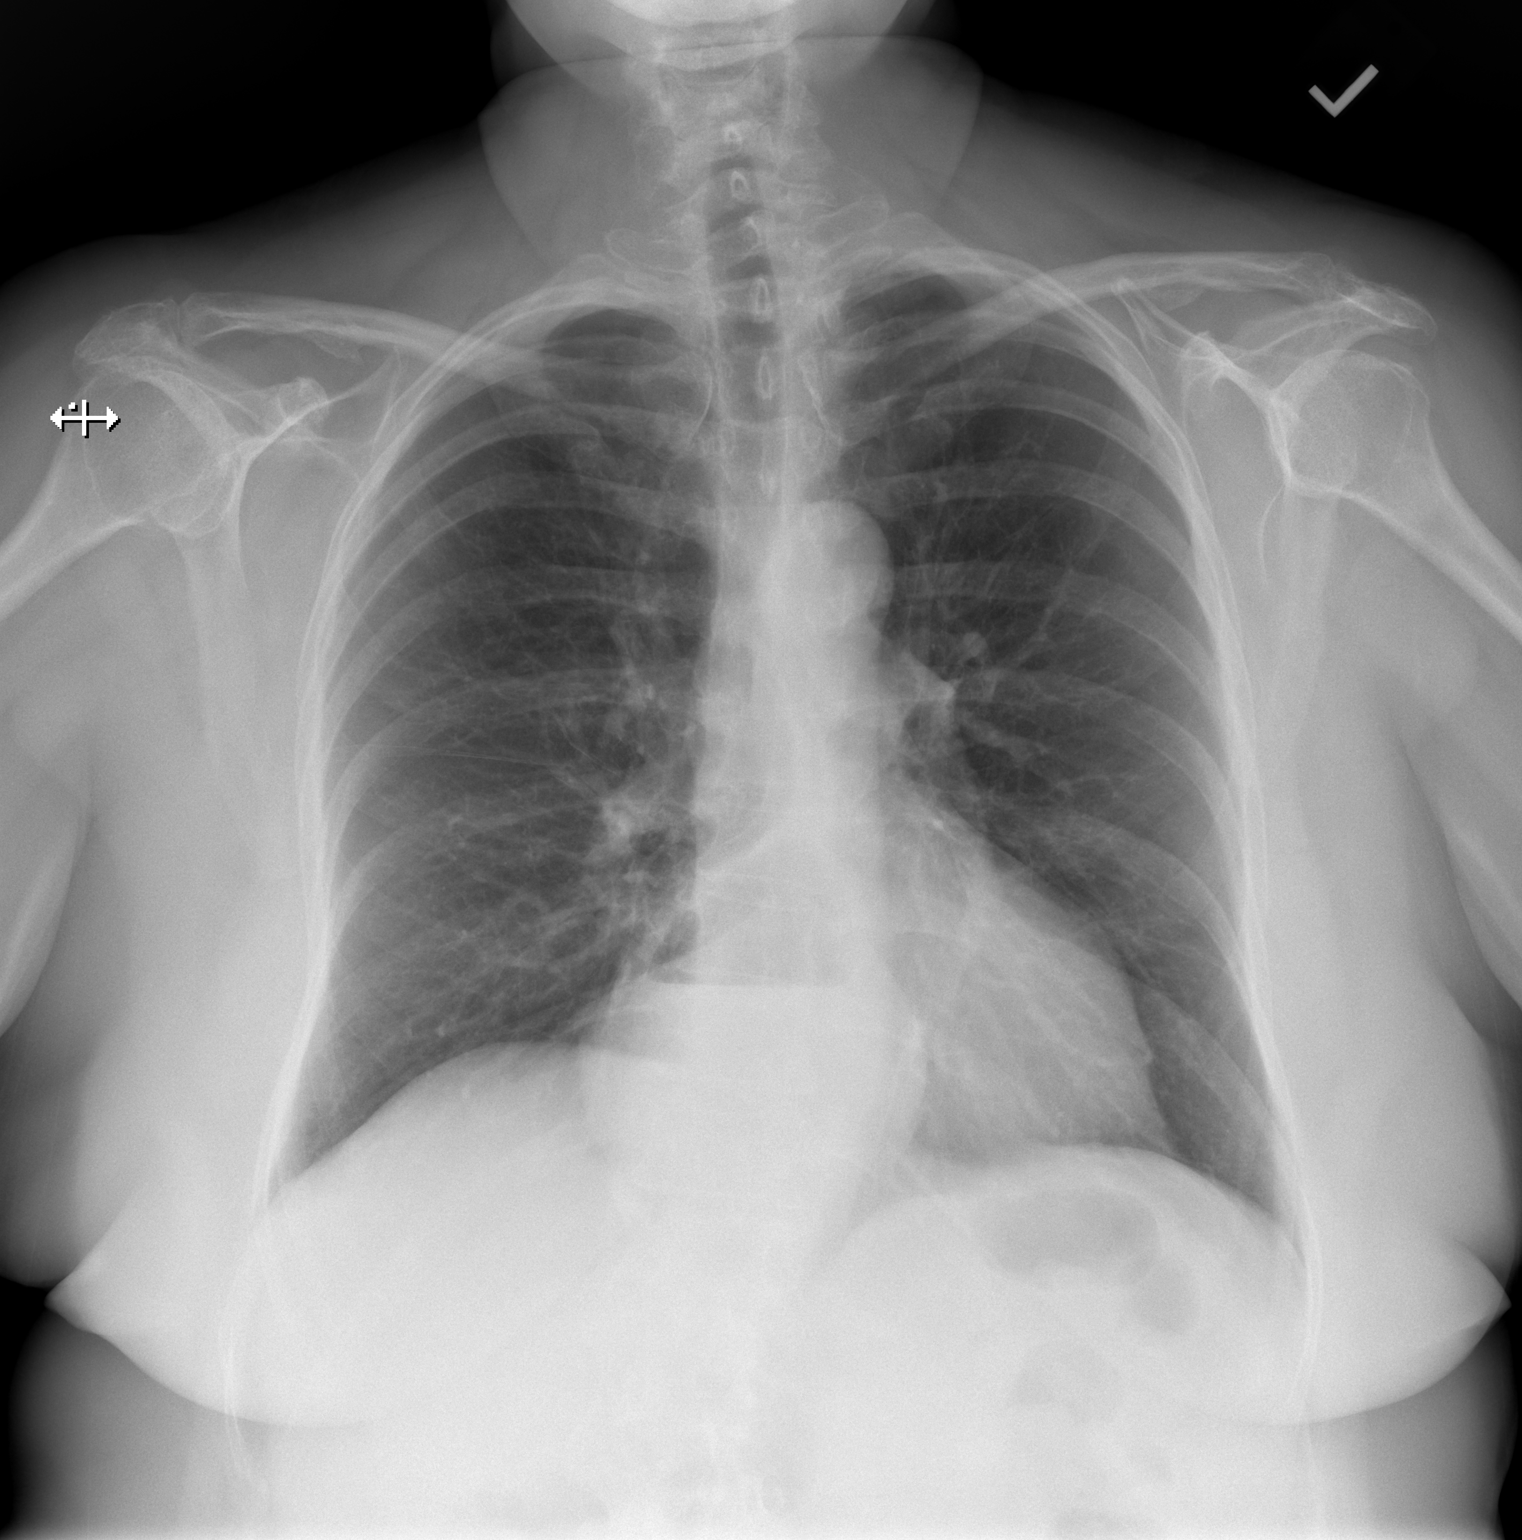

[w chest lat]
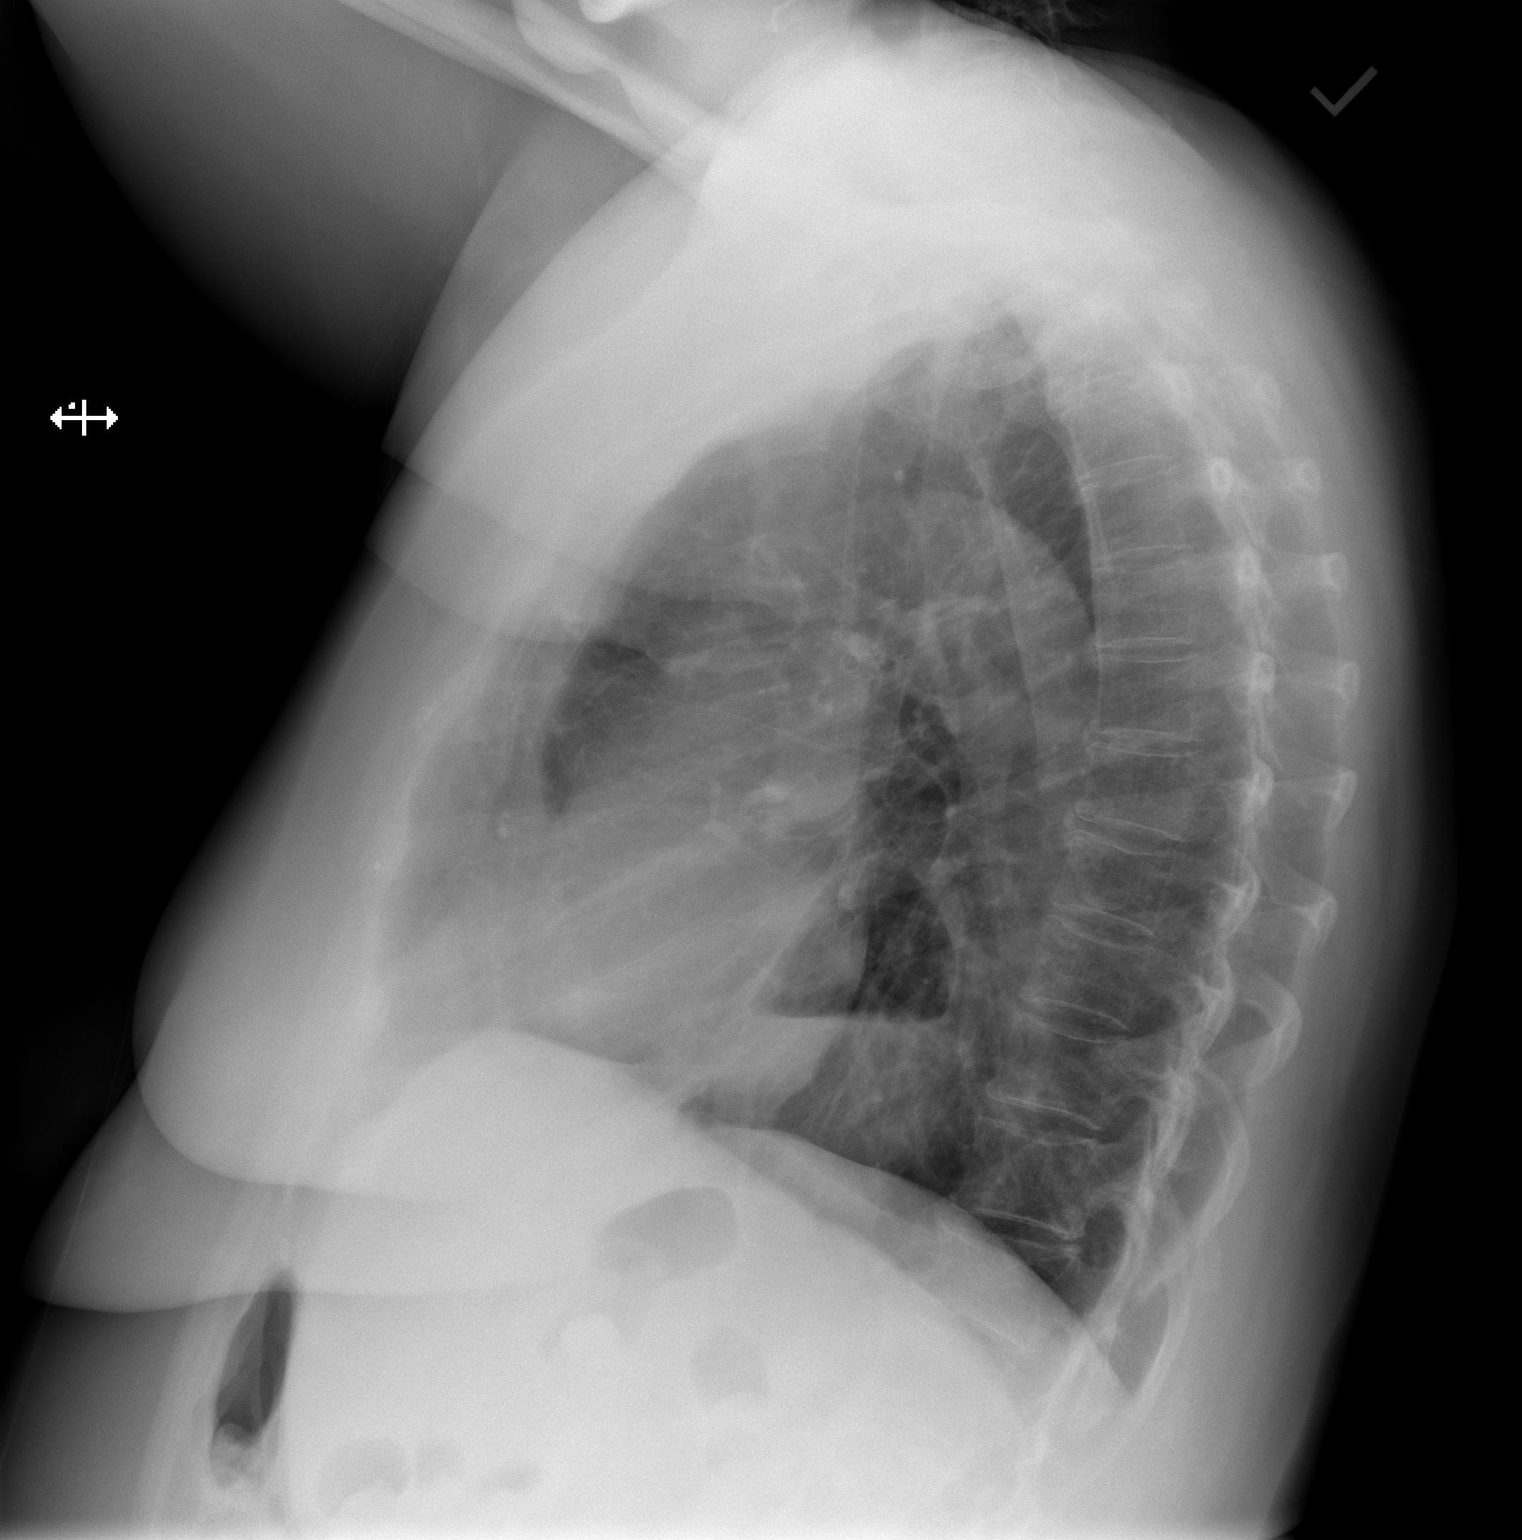

[2 of 2 positions shown; findings below may reference images not displayed]

FINDINGS: Cardiac shadow is within normal limits. The lungs are well aerated
without focal infiltrate or sizable effusion. Hiatal hernia is
noted. Chronic compression deformity is noted in the lower thoracic
spine. No other focal bony abnormality is noted.
IMPRESSION: Moderate-sized hiatal hernia.  No other focal abnormality is noted.

## 2021-12-23 ENCOUNTER — Ambulatory Visit: Payer: Medicare PPO | Admitting: Nurse Practitioner

## 2021-12-23 ENCOUNTER — Other Ambulatory Visit: Payer: Self-pay

## 2021-12-23 VITALS — BP 120/68 | HR 64 | Temp 99.0°F | Ht 62.0 in | Wt 224.0 lb

## 2021-12-23 DIAGNOSIS — R509 Fever, unspecified: Secondary | ICD-10-CM | POA: Diagnosis not present

## 2021-12-23 DIAGNOSIS — R0981 Nasal congestion: Secondary | ICD-10-CM

## 2021-12-23 DIAGNOSIS — R0789 Other chest pain: Secondary | ICD-10-CM

## 2021-12-23 DIAGNOSIS — R051 Acute cough: Secondary | ICD-10-CM

## 2021-12-23 DIAGNOSIS — Z1152 Encounter for screening for COVID-19: Secondary | ICD-10-CM | POA: Diagnosis not present

## 2021-12-23 DIAGNOSIS — J029 Acute pharyngitis, unspecified: Secondary | ICD-10-CM

## 2021-12-23 LAB — POC COVID19 BINAXNOW: SARS Coronavirus 2 Ag: NEGATIVE

## 2021-12-23 MED ORDER — PREDNISONE 20 MG PO TABS: ORAL_TABLET | ORAL | 0 refills | Status: DC

## 2021-12-23 MED ORDER — AZITHROMYCIN 250 MG PO TABS: ORAL_TABLET | ORAL | 1 refills | Status: DC

## 2021-12-23 MED ORDER — PROMETHAZINE-DM 6.25-15 MG/5ML PO SYRP: 5.0000 mL | ORAL_SOLUTION | Freq: Four times a day (QID) | ORAL | 0 refills | Status: DC | PRN

## 2021-12-23 NOTE — Progress Notes (Signed)
Assessment and Plan:  Whitney Herrera was seen today for an episodic visit.  Diagnoses and all order for this visit:  1. Encounter for screening for COVID-19 Negative  - POC COVID-19  2. Acute pharyngitis, unspecified etiology Warm saltwater gargles several time daily.  - azithromycin (ZITHROMAX) 250 MG tablet; Take 2 tablets (500 mg) on Day 1 followed by 1 tablet  (250 mg) daily until complete.  Dispense: 6 each; Refill: 1 - predniSONE (DELTASONE) 20 MG tablet; Take 2 tabs (40 mg) for 3 days followed by 1 tab (20 mg) for 4 days.  Dispense: 10 tablet; Refill: 0  3. Acute cough Stay well hydrated to keep mucus thin and productive.  - promethazine-dextromethorphan (PROMETHAZINE-DM) 6.25-15 MG/5ML syrup; Take 5 mLs by mouth 4 (four) times daily as needed for cough.  Dispense: 240 mL; Refill: 0  4. Nasal congestion Continue Mucinex. Stay well hydrated to keep mucus thin and productive.  5. Other chest pain IBU or Tylenol OTC as directed PRN  - predniSONE (DELTASONE) 20 MG tablet; Take 2 tabs (40 mg) for 3 days followed by 1 tab (20 mg) for 4 days.  Dispense: 10 tablet; Refill: 0  6. Low grade fever IBU or Tylenol OTC as directed PRN  - azithromycin (ZITHROMAX) 250 MG tablet; Take 2 tablets (500 mg) on Day 1 followed by 1 tablet  (250 mg) daily until complete.  Dispense: 6 each; Refill: 1  7. Sore throat Warm salt water gargles several times throughout the day.  Continue to monitor for any increase in fever, chills, N/V, SOB, worsening symptoms.  Notify office for further evaluation and treatment, questions or concerns if s/s fail to improve. The risks and benefits of my recommendations, as well as other treatment options were discussed with the patient today. Questions were answered.  Further disposition pending results of labs. Discussed med's effects and SE's.    Over 15 minutes of exam, counseling, chart review, and critical decision making was performed.   Future  Appointments  Date Time Provider [place]  02/25/2022 11:00 AM Darrol Jump, NP [facility] None  04/03/2022 11:00 AM [facility] DX DEXA 1 [facility]  05/28/2022 10:30 AM Unk Pinto, MD [facility] None  10/13/2022 11:00 AM Unk Pinto, MD [facility] None    ------------------------------------------------------------------------------------------------------------------   HPI BP 120/68   Pulse 64   Temp 99 F (37.2 C)   Ht '5\' 2"'$  (1.575 m)   Wt 224 lb (101.6 kg)   SpO2 92%   BMI 40.97 kg/m   Whitney Herrera is a 66 y.o. female who presents for evaluation of symptoms of a URI, possible sinusitis. Symptoms include low grade fevers, chest congestion, chest pain during cough, hoarseness, nasal blockage, post nasal drip, sneezing, and sore throat. Onset of symptoms was 1 week ago, unchanged since that time.   She is drinking plenty of fluids. Evaluation to date: none. Treatment to date: antihistamines, cough suppressants, and decongestants.  Reports traveling to the beach last week with her family, her husband is also sick with the same symptoms.      Past Medical History:  Diagnosis Date   B12 deficiency    Prediabetes    Vitamin D deficiency      Allergies  Allergen Reactions   Lipitor [Atorvastatin]    Wellbutrin [Bupropion]     dysphoria    Current Outpatient Medications on File Prior to Visit  Medication Sig   atenolol (TENORMIN) 100 MG tablet TAKE 1 TABLET BY MOUTH ONCE DAILY  FOR BLOOD PRESSURE   Cholecalciferol (VITAMIN D PO) Take 5,000 Units by mouth daily.    Cyanocobalamin (B-12) 2500 MCG SUBL Place 1 tablet under the tongue every other day.   escitalopram (LEXAPRO) 20 MG tablet TAKE 1 TABLET DAILY FOR MOOD   iron polysaccharides (NIFEREX) 150 MG capsule Take 1 capsule (150 mg total) by mouth daily.   metFORMIN (GLUCOPHAGE-XR) 500 MG 24 hr tablet Take 1 tablets 2 x  /day with meals  for Diabetes   Multiple Vitamins-Minerals (ONE DAILY  MULTIVITAMIN WOMEN PO) Take 1 tablet by mouth daily. With iron and B vitamins   olmesartan (BENICAR) 20 MG tablet Take  1/2 to 1 tablet  at night for BP   phentermine (ADIPEX-P) 37.5 MG tablet Take 1 tablet every Morning for Dieting  & Weight Loss   rosuvastatin (CRESTOR) 20 MG tablet TAKE 1 TABLET DAILY FOR CHOLESTEROL   topiramate (TOPAMAX) 50 MG tablet Take 1/2 to 1 tablet 2 x /day at Suppertime & Bedtime for Dieting & Weight Loss   No current facility-administered medications on file prior to visit.    ROS: all negative except what is noted in the HPI.    Physical Exam:  BP 120/68   Pulse 64   Temp 99 F (37.2 C)   Ht '5\' 2"'$  (1.575 m)   Wt 224 lb (101.6 kg)   SpO2 92%   BMI 40.97 kg/m   General Appearance: NAD.  Awake, conversant and cooperative. Eyes: PERRLA, EOMs intact.  Sclera white.  Conjunctiva without erythema. Sinuses: Frontal/maxillary tenderness.  ENT/Mouth: Ext aud canals clear.  Bilateral TMs w/DOL and without erythema or bulging. Hearing intact.  Posterior pharynx without swelling or exudate.  Tonsils without swelling or erythema.  Neck: Supple.  No masses, nodules or thyromegaly. Respiratory: Right posterior lung with mild expiratory wheezing.  Effort is regular with non-labored breathing. Breath sounds are equal bilaterally . Cardio: RRR with no MRGs. Brisk peripheral pulses without edema.  Abdomen: Active BS in all four quadrants.  Soft and non-tender without guarding, rebound tenderness, hernias or masses. Lymphatics: Non tender without lymphadenopathy.  Musculoskeletal: Full ROM, 5/5 strength, normal ambulation.  No clubbing or cyanosis. Skin: Appropriate color for ethnicity. Warm without rashes, lesions, ecchymosis, ulcers.  Neuro: CN II-XII grossly normal. Normal muscle tone without cerebellar symptoms and intact sensation.   Psych: AO X 3,  appropriate mood and affect, insight and judgment.     Darrol Jump, NP 11:50 AM [facility]

## 2022-02-25 ENCOUNTER — Ambulatory Visit: Payer: Medicare PPO | Admitting: Nurse Practitioner

## 2022-02-25 ENCOUNTER — Ambulatory Visit: Payer: Medicare PPO | Admitting: Adult Health

## 2022-03-12 ENCOUNTER — Encounter: Payer: Self-pay | Admitting: Nurse Practitioner

## 2022-03-12 ENCOUNTER — Ambulatory Visit: Payer: Medicare PPO | Admitting: Nurse Practitioner

## 2022-03-12 VITALS — BP 112/62 | HR 69 | Temp 97.7°F | Ht 62.0 in | Wt 226.0 lb

## 2022-03-12 DIAGNOSIS — I10 Essential (primary) hypertension: Secondary | ICD-10-CM

## 2022-03-12 DIAGNOSIS — M3500 Sicca syndrome, unspecified: Secondary | ICD-10-CM | POA: Diagnosis not present

## 2022-03-12 DIAGNOSIS — F419 Anxiety disorder, unspecified: Secondary | ICD-10-CM

## 2022-03-12 DIAGNOSIS — D649 Anemia, unspecified: Secondary | ICD-10-CM | POA: Diagnosis not present

## 2022-03-12 DIAGNOSIS — Z79899 Other long term (current) drug therapy: Secondary | ICD-10-CM

## 2022-03-12 DIAGNOSIS — R6889 Other general symptoms and signs: Secondary | ICD-10-CM | POA: Diagnosis not present

## 2022-03-12 DIAGNOSIS — M810 Age-related osteoporosis without current pathological fracture: Secondary | ICD-10-CM | POA: Diagnosis not present

## 2022-03-12 DIAGNOSIS — D509 Iron deficiency anemia, unspecified: Secondary | ICD-10-CM | POA: Diagnosis not present

## 2022-03-12 DIAGNOSIS — R7309 Other abnormal glucose: Secondary | ICD-10-CM

## 2022-03-12 DIAGNOSIS — E782 Mixed hyperlipidemia: Secondary | ICD-10-CM | POA: Diagnosis not present

## 2022-03-12 DIAGNOSIS — R41840 Attention and concentration deficit: Secondary | ICD-10-CM

## 2022-03-12 DIAGNOSIS — E559 Vitamin D deficiency, unspecified: Secondary | ICD-10-CM | POA: Diagnosis not present

## 2022-03-12 DIAGNOSIS — Z23 Encounter for immunization: Secondary | ICD-10-CM

## 2022-03-12 DIAGNOSIS — Z0001 Encounter for general adult medical examination with abnormal findings: Secondary | ICD-10-CM

## 2022-03-12 DIAGNOSIS — Z0000 Encounter for general adult medical examination without abnormal findings: Secondary | ICD-10-CM

## 2022-03-12 NOTE — Progress Notes (Signed)
MEDICARE ANNUAL WELLNESS VISIT AND FOLLOW UP  Assessment:    Diagnoses and all orders for this visit:  Annual Medicare preventive visit Due annually  Health maintenance reviewed   Essential hypertension Discussed DASH (Dietary Approaches to Stop Hypertension) DASH diet is lower in sodium than a typical American diet. Cut back on foods that are high in saturated fat, cholesterol, and trans fats. Eat more whole-grain foods, fish, poultry, and nuts Remain active and exercise as tolerated daily.  Monitor BP at home-Call if greater than 130/80.  Check CMP/CBC   Age-related osteoporosis without current pathological fracture Pursue a combination of weight-bearing exercises and strength training. Advised on fall prevention measures including proper lighting in all rooms, removal of area rugs and floor clutter, use of walking devices as deemed appropriate, avoidance of uneven walking surfaces. Smoking cessation and moderate alcohol consumption if applicable Consume 009 to 1000 IU of vitamin D daily with a goal vitamin D serum value of 30 ng/mL or higher. Aim for 1000 to 1200 mg of elemental calcium daily through supplements and/or dietary sources.   Sjogren's syndrome, with unspecified organ involvement ([place]) Monitor; ophth and dental regularly   Morbid obesity ([place])- BMI 40+ Discussed appropriate BMI Goal of losing 1 lb per month. Diet modification. Physical activity. Encouraged/praised to build confidence.   Abnormal glucose Education: Reviewed 'ABCs' of diabetes management  Discussed goals to be met and/or maintained include A1C (<7) Blood pressure (<130/80) Cholesterol (LDL <70) Continue Eye Exam yearly  Continue Dental Exam Q6 mo Discussed dietary recommendations Discussed Physical Activity recommendations Foot exam UTD Monitor A1C   Hyperlipidemia, mixed Discussed lifestyle modifications. Recommended diet heavy in fruits and veggies, omega 3's. Decrease  consumption of animal meats, cheeses, and dairy products. Remain active and exercise as tolerated. Monitor lipids/TSH  Medication management All medications discussed and reviewed in full. All questions and concerns regarding medications addressed.     Vitamin D deficiency Continue supplement Monitor levels  B12 deficiency Continue supplement Monitor levels  Iron deficiency anemia, unspecified iron deficiency anemia type Dr. Irene Limbo following  Continue to monitor Monitor CBC  Anxiety Well managed by current regimen; continue medications Stress management techniques discussed, increase water, good sleep hygiene discussed, increase exercise, and increase veggies.   Attention deficit Possible undiagnosed mild ADD, has failed wellbutrin, may benefit from eating behaviors from vyvanse Risks and bneefits discussed. Will do 30 day trial with close follow up for benefit. The patient was counseled on the addictive nature of the medication and was encouraged to take drug holidays when not needed.   Flu vaccine need Administered - pt tolerated well  Orders Placed This Encounter  Procedures   CBC with Differential/Platelet   COMPLETE METABOLIC PANEL WITH GFR   Lipid panel     Notify office for further evaluation and treatment, questions or concerns if any reported s/s fail to improve.   The patient was advised to call back or seek an in-person evaluation if any symptoms worsen or if the condition fails to improve as anticipated.   Further disposition pending results of labs. Discussed med's effects and SE's.    I discussed the assessment and treatment plan with the patient. The patient was provided an opportunity to ask questions and all were answered. The patient agreed with the plan and demonstrated an understanding of the instructions.  Discussed med's effects and SE's. Screening labs and tests as requested with regular follow-up as recommended.  I provided 30 minutes of  face-to-face time during this encounter including  counseling, chart review, and critical decision making was preformed.  Future Appointments  Date Time Provider [place]  04/03/2022 11:00 AM [facility] DX DEXA 1 [facility]  05/28/2022 10:30 AM Unk Pinto, MD [facility] None  10/13/2022 11:00 AM Unk Pinto, MD [facility] None  01/13/2023  2:00 PM Darrol Jump, NP [facility] None     Plan:   During the course of the visit the patient was educated and counseled about appropriate screening and preventive services including:   Pneumococcal vaccine  Prevnar 13 Influenza vaccine Td vaccine Screening electrocardiogram Bone densitometry screening Colorectal cancer screening Diabetes screening Glaucoma screening Nutrition counseling  Advanced directives: requested   Subjective:  Whitney Herrera is a 66 y.o. female who presents for Medicare Annual Wellness Visit and 3 month follow up. She has Essential hypertension; Hyperlipidemia, mixed; Morbid obesity ([place]) - BMI 40+; B12 deficiency; Vitamin D deficiency; Abnormal glucose; Screening for heart disease; Sjogren's syndrome ([place]); Anxiety; Osteoporosis; Symptomatic anemia; Iron deficiency anemia; Attention deficit; and FH: hypertension on their problem list.  Overall she reports feeling well.  She has not new or additional concerns in clinic today.    She was prescribed fosamax weekly for osteoporosis in 06/2018 via GYN Dr. Radene Knee  She is currently taking lexapro 20 mg daily that was started for work related stress/anxiety several years ago, doing much better since retiring in July 2019, however continues due to pandemic. Denies depression, lack of enjoyment, but reports noting persistent poor focus, struggles to start and complete tasks. Has tried wellbutrin but didn't tolerate well.   Left knee pain, had steroid injection by [facility], planning to follow up soon, still bothering her fairly consistently.    BMI is Body mass index is 41.34 kg/m., she is working on diet and exercise. Wt Readings from Last 3 Encounters:  03/12/22 226 lb (102.5 kg)  12/23/21 224 lb (101.6 kg)  10/03/21 229 lb 6.4 oz (104.1 kg)    Her blood pressure has been controlled at home, today their BP is BP: 112/62 She does not workout. She denies chest pain, shortness of breath, dizziness.   She is on cholesterol medication (rosuvastatin 20 mg daily, ? Was off last visit) and denies myalgias. Her cholesterol is not at goal. The cholesterol last visit was:   Lab Results  Component Value Date   CHOL 166 10/03/2021   HDL 49 (L) 10/03/2021   LDLCALC 91 10/03/2021   TRIG 163 (H) 10/03/2021   CHOLHDL 3.4 10/03/2021   She has not been working on diet and exercise for glucose management, and denies increased appetite, nausea, paresthesia of the feet, polydipsia, and polyuria. Last A1C in the office was:  Lab Results  Component Value Date   HGBA1C 5.6 10/03/2021   Last GFR: Lab Results  Component Value Date   GFRNONAA >60 02/15/2021   Patient is on Vitamin D supplement.   Lab Results  Component Value Date   VD25OH 48 10/03/2021     She has anemia; iron deficiency, had negative hemoccult x 3 in April 2021, was on iron supplement but stopped due to GI SE, Had negatvie EGD and colonosocpy with Dr. Earlean Shawl in Nov 2019 for anemia workup. Was referred to Dr. Irene Limbo and was underwent IV injectafer weekly x 2, recommended iron polysaccharide and B complex, had follow up last week but pending recommenndations    Latest Ref Rng & Units 10/03/2021   11:11 AM 05/28/2021   11:22 AM 02/22/2021   12:03 PM  CBC  WBC 3.8 - 10.8 Thousand/uL 7.2  7.0  7.8   Hemoglobin 11.7 - 15.5 g/dL 12.3  12.8  12.8   Hematocrit 35.0 - 45.0 % 39.8  40.1  40.8   Platelets 140 - 400 Thousand/uL 228  220  240    Taking ferex 150 mg daily Lab Results  Component Value Date   IRON 147 10/03/2021   TIBC 485 (H) 10/03/2021   FERRITIN 10 (L)  02/15/2021   Taking 2500 mcg SL daily  Lab Results  Component Value Date   VITAMINB12 398 05/28/2021      Medication Review: Current Outpatient Medications on File Prior to Visit  Medication Sig Dispense Refill   atenolol (TENORMIN) 100 MG tablet TAKE 1 TABLET BY MOUTH ONCE DAILY FOR BLOOD PRESSURE 90 tablet 3   Cholecalciferol (VITAMIN D PO) Take 5,000 Units by mouth daily.      escitalopram (LEXAPRO) 20 MG tablet TAKE 1 TABLET DAILY FOR MOOD 90 tablet 3   Multiple Vitamins-Minerals (ONE DAILY MULTIVITAMIN WOMEN PO) Take 1 tablet by mouth daily. With iron and B vitamins     olmesartan (BENICAR) 20 MG tablet Take  1/2 to 1 tablet  at night for BP 90 tablet 3   phentermine (ADIPEX-P) 37.5 MG tablet Take 1 tablet every Morning for Dieting  & Weight Loss 90 tablet 1   rosuvastatin (CRESTOR) 20 MG tablet TAKE 1 TABLET DAILY FOR CHOLESTEROL 90 tablet 1   azithromycin (ZITHROMAX) 250 MG tablet Take 2 tablets (500 mg) on Day 1 followed by 1 tablet  (250 mg) daily until complete. 6 each 1   Cyanocobalamin (B-12) 2500 MCG SUBL Place 1 tablet under the tongue every other day. (Patient not taking: Reported on 03/12/2022) 45 tablet 0   iron polysaccharides (NIFEREX) 150 MG capsule Take 1 capsule (150 mg total) by mouth daily. (Patient not taking: Reported on 03/12/2022) 30 capsule 5   metFORMIN (GLUCOPHAGE-XR) 500 MG 24 hr tablet Take 1 tablets 2 x  /day with meals  for Diabetes (Patient not taking: Reported on 03/12/2022) 180 tablet 3   predniSONE (DELTASONE) 20 MG tablet Take 2 tabs (40 mg) for 3 days followed by 1 tab (20 mg) for 4 days. 10 tablet 0   promethazine-dextromethorphan (PROMETHAZINE-DM) 6.25-15 MG/5ML syrup Take 5 mLs by mouth 4 (four) times daily as needed for cough. 240 mL 0   topiramate (TOPAMAX) 50 MG tablet Take 1/2 to 1 tablet 2 x /day at Suppertime & Bedtime for Dieting & Weight Loss (Patient not taking: Reported on 03/12/2022) 180 tablet 3   No current facility-administered  medications on file prior to visit.    Allergies  Allergen Reactions   Lipitor [Atorvastatin]    Wellbutrin [Bupropion]     dysphoria    Current Problems (verified) Patient Active Problem List   Diagnosis Date Noted   FH: hypertension 10/03/2021   Attention deficit 02/22/2021   Iron deficiency anemia 11/09/2019   Symptomatic anemia 11/04/2019   Osteoporosis 10/15/2018   Anxiety 04/19/2018   Sjogren's syndrome ([place]) 05/08/2014   Screening for heart disease 07/19/2013   Abnormal glucose    Essential hypertension    Hyperlipidemia, mixed    Morbid obesity ([place]) - BMI 40+    B12 deficiency    Vitamin D deficiency     Screening Tests Immunization History  Administered Date(s) Administered   Influenza Inj Mdck Quad Pf 03/09/2019   Influenza, High Dose Seasonal PF 02/21/2021   Influenza-Unspecified 04/28/2015, 02/26/2016, 03/09/2019  PFIZER(Purple Top)SARS-COV-2 Vaccination 08/10/2019, 09/07/2019, 12/22/2020   PNEUMOCOCCAL CONJUGATE-20 02/22/2021   PPD Test 01/31/2014, 02/12/2015, 03/10/2016, 06/18/2017, 07/28/2019, 10/01/2020   Pneumococcal-Unspecified 06/17/2008   Td 04/18/2007   Tdap 06/18/2017   Zoster Recombinat (Shingrix) 07/30/2020, 10/19/2020    Preventative care: Last colonoscopy: 2019, Dr. Earlean Shawl, 10 year recall  Last mammogram: 09/2021 Negative Re-screen 1 year Last pap smear/pelvic exam: last 2020, never abnormal, by GYN    DEXA: reports 2020 Due 03/2022  Prior vaccinations: TD or Tdap: 2019  Influenza: Due 03/2022 Pneumococcal: Due Today  Pneumo 20: TODAY  Shingrix: 2/2, 2022 Covid 19: 2/2, booster 11/2020  Names of Other Physician/Practitioners you currently use: 1. [facility] here for primary care 2. [facility], eye doctor, last visit 2021, glasses, goes annually 3. Dr. Radford Pax, dentist, last visit 2022, q84m  Patient Care Team: Unk Pinto, MD as PCP - General (Internal Medicine) Arvella Nigh, MD as  Consulting Physician (Obstetrics and Gynecology)  SURGICAL HISTORY She  has a past surgical history that includes Breast biopsy (Right, 1975); Appendectomy; and Inguinal hernia repair (Bilateral, age 56). FAMILY HISTORY Her family history includes Alzheimer's disease in her father and mother; Hypertension in her father and mother. SOCIAL HISTORY She  reports that she has never smoked. She has never used smokeless tobacco. She reports current alcohol use of about 2.0 standard drinks of alcohol per week. She reports that she does not use drugs.   MEDICARE WELLNESS OBJECTIVES: Physical activity:   Cardiac risk factors:   Depression/mood screen:      03/12/2022    2:34 PM  Depression screen PHQ 2/9  Decreased Interest 0  Down, Depressed, Hopeless 0  PHQ - 2 Score 0    ADLs:     03/12/2022    2:32 PM 10/03/2021   12:39 AM  In your present state of health, do you have any difficulty performing the following activities:  Hearing? 0 0  Vision? 0 0  Difficulty concentrating or making decisions? 0 0  Walking or climbing stairs? 0 0  Dressing or bathing? 0 0  Doing errands, shopping? 0 0  Preparing Food and eating ? N   Using the Toilet? N   In the past six months, have you accidently leaked urine? N   Do you have problems with loss of bowel control? N   Managing your Medications? N   Managing your Finances? N   Housekeeping or managing your Housekeeping? N      Cognitive Testing  Alert? Yes  Normal Appearance?Yes  Oriented to person? Yes  Place? Yes   Time? Yes  Recall of three objects?  Yes  Can perform simple calculations? Yes  Displays appropriate judgment?Yes  Can read the correct time from a watch face?Yes  EOL planning: Does Patient Have a Medical Advance Directive?: Yes Type of Advance Directive: Living will  Review of Systems  Constitutional:  Positive for malaise/fatigue. Negative for weight loss.  HENT:  Negative for hearing loss and tinnitus.   Eyes:  Negative  for blurred vision and double vision.  Respiratory:  Negative for cough, sputum production, shortness of breath and wheezing.   Cardiovascular:  Negative for chest pain, palpitations, orthopnea, claudication, leg swelling and PND.  Gastrointestinal:  Negative for abdominal pain, blood in stool, constipation, diarrhea, heartburn, melena, nausea and vomiting.  Genitourinary: Negative.   Musculoskeletal:  Positive for joint pain (L knee, ortho follows). Negative for falls and myalgias.  Skin:  Negative for rash.  Neurological:  Negative for dizziness, tingling, sensory change, weakness and headaches.  Endo/Heme/Allergies:  Negative for polydipsia.  Psychiatric/Behavioral: Negative.  Negative for depression, memory loss, substance abuse and suicidal ideas. The patient is not nervous/anxious and does not have insomnia.        Poor focus and motivation   All other systems reviewed and are negative.    Objective:     Today's Vitals   03/12/22 1405  BP: 112/62  Pulse: 69  Temp: 97.7 F (36.5 C)  SpO2: 99%  Weight: 226 lb (102.5 kg)  Height: 5' 2"  (1.575 m)   Body mass index is 41.34 kg/m.  General appearance: alert, no distress, WD/WN, female HEENT: normocephalic, sclerae anicteric, TMs pearly, nares patent, no discharge or erythema, pharynx normal Oral cavity: MMM, no lesions Neck: supple, no lymphadenopathy, no thyromegaly, no masses Heart: RRR, normal S1, S2, no murmurs Lungs: CTA bilaterally, no wheezes, rhonchi, or rales Abdomen: +bs, soft, morbidly obese abdomen, non tender, non distended, no masses, no hepatomegaly, no splenomegaly Musculoskeletal: nontender, no swelling, no obvious deformity Extremities: no edema, no cyanosis, no clubbing Pulses: 2+ symmetric, upper and lower extremities, normal cap refill Neurological: alert, oriented x 3, CN2-12 intact, strength normal upper extremities and lower extremities, sensation normal throughout, DTRs 2+ throughout, no cerebellar  signs, gait normal Psychiatric: normal affect, behavior normal, pleasant   Medicare Attestation I have personally reviewed: The patient's medical and social history Their use of alcohol, tobacco or illicit drugs Their current medications and supplements The patient's functional ability including ADLs,fall risks, home safety risks, cognitive, and hearing and visual impairment Diet and physical activities Evidence for depression or mood disorders  The patient's weight, height, BMI, and visual acuity have been recorded in the chart.  I have made referrals, counseling, and provided education to the patient based on review of the above and I have provided the patient with a written personalized care plan for preventive services.     Darrol Jump, NP   03/12/2022

## 2022-03-12 NOTE — Patient Instructions (Signed)

## 2022-03-12 NOTE — Addendum Note (Signed)
Addended by: Chancy Hurter on: 03/12/2022 03:23 PM   Modules accepted: Orders

## 2022-03-13 ENCOUNTER — Other Ambulatory Visit: Payer: Self-pay | Admitting: Nurse Practitioner

## 2022-03-13 DIAGNOSIS — D509 Iron deficiency anemia, unspecified: Secondary | ICD-10-CM

## 2022-03-13 DIAGNOSIS — D649 Anemia, unspecified: Secondary | ICD-10-CM

## 2022-03-15 LAB — COMPLETE METABOLIC PANEL WITH GFR
AG Ratio: 1.6 (calc) (ref 1.0–2.5)
ALT: 9 U/L (ref 6–29)
AST: 15 U/L (ref 10–35)
Albumin: 4.2 g/dL (ref 3.6–5.1)
Alkaline phosphatase (APISO): 72 U/L (ref 37–153)
BUN: 17 mg/dL (ref 7–25)
CO2: 30 mmol/L (ref 20–32)
Calcium: 9.7 mg/dL (ref 8.6–10.4)
Chloride: 104 mmol/L (ref 98–110)
Creat: 0.64 mg/dL (ref 0.50–1.05)
Globulin: 2.7 g/dL (calc) (ref 1.9–3.7)
Glucose, Bld: 102 mg/dL — ABNORMAL HIGH (ref 65–99)
Potassium: 4.3 mmol/L (ref 3.5–5.3)
Sodium: 141 mmol/L (ref 135–146)
Total Bilirubin: 0.3 mg/dL (ref 0.2–1.2)
Total Protein: 6.9 g/dL (ref 6.1–8.1)
eGFR: 97 mL/min/{1.73_m2} (ref >=60)

## 2022-03-15 LAB — LIPID PANEL
Cholesterol: 172 mg/dL (ref <200)
HDL: 49 mg/dL — ABNORMAL LOW (ref >=50)
LDL Cholesterol (Calc): 92 mg/dL (calc)
Non-HDL Cholesterol (Calc): 123 mg/dL (calc) (ref <130)
Total CHOL/HDL Ratio: 3.5 (calc) (ref <5.0)
Triglycerides: 222 mg/dL — ABNORMAL HIGH (ref <150)

## 2022-03-15 LAB — CBC WITH DIFFERENTIAL/PLATELET
Absolute Monocytes: 552 cells/uL (ref 200–950)
Basophils Absolute: 64 cells/uL (ref 0–200)
Basophils Relative: 0.8 %
Eosinophils Absolute: 288 cells/uL (ref 15–500)
Eosinophils Relative: 3.6 %
HCT: 34.9 % — ABNORMAL LOW (ref 35.0–45.0)
Hemoglobin: 10.7 g/dL — ABNORMAL LOW (ref 11.7–15.5)
Lymphs Abs: 2032 cells/uL (ref 850–3900)
MCH: 23.5 pg — ABNORMAL LOW (ref 27.0–33.0)
MCHC: 30.7 g/dL — ABNORMAL LOW (ref 32.0–36.0)
MCV: 76.7 fL — ABNORMAL LOW (ref 80.0–100.0)
MPV: 11.7 fL (ref 7.5–12.5)
Monocytes Relative: 6.9 %
Neutro Abs: 5064 cells/uL (ref 1500–7800)
Neutrophils Relative %: 63.3 %
Platelets: 270 10*3/uL (ref 140–400)
RBC: 4.55 10*6/uL (ref 3.80–5.10)
RDW: 14.9 % (ref 11.0–15.0)
Total Lymphocyte: 25.4 %
WBC: 8 10*3/uL (ref 3.8–10.8)

## 2022-03-15 LAB — IRON,TIBC AND FERRITIN PANEL
%SAT: 18 % (calc) (ref 16–45)
Ferritin: 3 ng/mL — ABNORMAL LOW (ref 16–288)
Iron: 96 ug/dL (ref 45–160)
TIBC: 532 mcg/dL (calc) — ABNORMAL HIGH (ref 250–450)

## 2022-03-15 LAB — TEST AUTHORIZATION

## 2022-04-03 ENCOUNTER — Ambulatory Visit
Admission: RE | Admit: 2022-04-03 | Discharge: 2022-04-03 | Disposition: A | Discharge status: Home / Self Care | Payer: Medicare PPO | Source: Ambulatory Visit | Attending: Internal Medicine | Admitting: Internal Medicine

## 2022-04-03 DIAGNOSIS — M810 Age-related osteoporosis without current pathological fracture: Secondary | ICD-10-CM

## 2022-04-29 ENCOUNTER — Ambulatory Visit: Payer: Medicare PPO | Admitting: Nurse Practitioner

## 2022-04-29 ENCOUNTER — Encounter: Payer: Self-pay | Admitting: Nurse Practitioner

## 2022-04-29 ENCOUNTER — Other Ambulatory Visit: Payer: Self-pay

## 2022-04-29 VITALS — BP 122/68 | HR 59 | Temp 97.7°F | Ht 62.0 in | Wt 227.8 lb

## 2022-04-29 DIAGNOSIS — Z1152 Encounter for screening for COVID-19: Secondary | ICD-10-CM

## 2022-04-29 DIAGNOSIS — R6889 Other general symptoms and signs: Secondary | ICD-10-CM | POA: Diagnosis not present

## 2022-04-29 DIAGNOSIS — I10 Essential (primary) hypertension: Secondary | ICD-10-CM | POA: Diagnosis not present

## 2022-04-29 DIAGNOSIS — J40 Bronchitis, not specified as acute or chronic: Secondary | ICD-10-CM | POA: Diagnosis not present

## 2022-04-29 LAB — POCT INFLUENZA A/B
Influenza A, POC: NEGATIVE
Influenza B, POC: NEGATIVE

## 2022-04-29 LAB — POC COVID19 BINAXNOW: SARS Coronavirus 2 Ag: NEGATIVE

## 2022-04-29 MED ORDER — ALBUTEROL SULFATE HFA 108 (90 BASE) MCG/ACT IN AERS: 2.0000 | INHALATION_SPRAY | Freq: Four times a day (QID) | RESPIRATORY_TRACT | 2 refills | Status: DC | PRN

## 2022-04-29 MED ORDER — PROMETHAZINE-DM 6.25-15 MG/5ML PO SYRP: 5.0000 mL | ORAL_SOLUTION | Freq: Four times a day (QID) | ORAL | 1 refills | Status: DC | PRN

## 2022-04-29 MED ORDER — DEXAMETHASONE SODIUM PHOSPHATE 10 MG/ML IJ SOLN
10.0000 mg | Freq: Once | INTRAMUSCULAR | Status: AC
  Administered 2022-04-29: 10 mg via INTRAMUSCULAR

## 2022-04-29 MED ORDER — IPRATROPIUM-ALBUTEROL 0.5-2.5 (3) MG/3ML IN SOLN
3.0000 mL | Freq: Once | RESPIRATORY_TRACT | Status: AC
  Administered 2022-04-29: 3 mL via RESPIRATORY_TRACT

## 2022-04-29 MED ORDER — AZITHROMYCIN 250 MG PO TABS: ORAL_TABLET | ORAL | 1 refills | Status: DC

## 2022-04-29 NOTE — Progress Notes (Signed)
Assessment and Plan: Truda was seen today for wheezing and cough.  Diagnoses and all orders for this visit:  Bronchitis Push fluids Mucinex as needed, finish prednisone taper -     dexamethasone (DECADRON) injection 10 mg -     azithromycin (ZITHROMAX) 250 MG tablet; Take 2 tablets (500 mg) on  Day 1,  followed by 1 tablet (250 mg) once daily on Days 2 through 5. -     promethazine-dextromethorphan (PROMETHAZINE-DM) 6.25-15 MG/5ML syrup; Take 5 mLs by mouth 4 (four) times daily as needed for cough. -     albuterol (VENTOLIN HFA) 108 (90 Base) MCG/ACT inhaler; Inhale 2 puffs into the lungs every 6 (six) hours as needed for wheezing or shortness of breath. -     ipratropium-albuterol (DUONEB) 0.5-2.5 (3) MG/3ML nebulizer solution 3 mL  Flu-like symptoms -     POCT Influenza A/B- negative  Encounter for screening for COVID-19 -     POC COVID-19- negative  Essential hypertension - continue medications, DASH diet, exercise and monitor at home. Call if greater than 130/80.  Go to the ER if any chest pain, shortness of breath, nausea, dizziness, severe HA, changes vision/speech        Further disposition pending results of labs. Discussed med's effects and SE's.   Over 30 minutes of exam, counseling, chart review, and critical decision making was performed.   Future Appointments  Date Time Provider [place]  07/01/2022 10:30 AM Unk Pinto, MD [facility] None  10/13/2022 11:00 AM Unk Pinto, MD [facility] None  03/16/2023 11:00 AM Darrol Jump, NP [facility] None    ------------------------------------------------------------------------------------------------------------------   HPI BP 122/68   Pulse (!) 59   Temp 97.7 F (36.5 C)   Ht '5\' 2"'$  (1.575 m)   Wt 227 lb 12.8 oz (103.3 kg)   SpO2 95%   BMI 41.67 kg/m   66 y.o.female presents for wet cough but cannot produce sputum and wheezing. Denies sinus congestion, nausea, vomiting, diarrhea, fevers.  She has used her husbands Prednisone 10 mg taper that he previously did not use.  She has done 6,5,4 and is on 3 tabs  today . Symptoms have persisted    Her BP is well controlled with Olmesartan 20 mg and Atenolol 100 mg QD.  Denies headaches, chest pain, and dizziness. BP Readings from Last 3 Encounters:  04/29/22 122/68  03/12/22 112/62  12/23/21 120/68     Past Medical History:  Diagnosis Date   B12 deficiency    Prediabetes    Vitamin D deficiency      Allergies  Allergen Reactions   Lipitor [Atorvastatin]    Wellbutrin [Bupropion]     dysphoria    Current Outpatient Medications on File Prior to Visit  Medication Sig   atenolol (TENORMIN) 100 MG tablet TAKE 1 TABLET BY MOUTH ONCE DAILY FOR BLOOD PRESSURE   Cholecalciferol (VITAMIN D PO) Take 5,000 Units by mouth daily.    escitalopram (LEXAPRO) 20 MG tablet TAKE 1 TABLET DAILY FOR MOOD   guaiFENesin (MUCINEX PO) Take by mouth.   Multiple Vitamins-Minerals (ONE DAILY MULTIVITAMIN WOMEN PO) Take 1 tablet by mouth daily. With iron and B vitamins   olmesartan (BENICAR) 20 MG tablet Take  1/2 to 1 tablet  at night for BP   phentermine (ADIPEX-P) 37.5 MG tablet Take 1 tablet every Morning for Dieting  & Weight Loss   promethazine-dextromethorphan (PROMETHAZINE-DM) 6.25-15 MG/5ML syrup Take 5 mLs by mouth 4 (four) times daily as needed for cough.  Pseudoeph-Doxylamine-DM-APAP (NYQUIL PO) Take by mouth.   rosuvastatin (CRESTOR) 20 MG tablet TAKE 1 TABLET DAILY FOR CHOLESTEROL   azithromycin (ZITHROMAX) 250 MG tablet Take 2 tablets (500 mg) on Day 1 followed by 1 tablet  (250 mg) daily until complete. (Patient not taking: Reported on 04/29/2022)   Cyanocobalamin (B-12) 2500 MCG SUBL Place 1 tablet under the tongue every other day. (Patient not taking: Reported on 03/12/2022)   iron polysaccharides (NIFEREX) 150 MG capsule Take 1 capsule (150 mg total) by mouth daily. (Patient not taking: Reported on 03/12/2022)   metFORMIN  (GLUCOPHAGE-XR) 500 MG 24 hr tablet Take 1 tablets 2 x  /day with meals  for Diabetes (Patient not taking: Reported on 03/12/2022)   predniSONE (DELTASONE) 20 MG tablet Take 2 tabs (40 mg) for 3 days followed by 1 tab (20 mg) for 4 days. (Patient not taking: Reported on 04/29/2022)   topiramate (TOPAMAX) 50 MG tablet Take 1/2 to 1 tablet 2 x /day at Suppertime & Bedtime for Dieting & Weight Loss (Patient not taking: Reported on 03/12/2022)   No current facility-administered medications on file prior to visit.    ROS: all negative except above.   Physical Exam:  BP 122/68   Pulse (!) 59   Temp 97.7 F (36.5 C)   Ht '5\' 2"'$  (1.575 m)   Wt 227 lb 12.8 oz (103.3 kg)   SpO2 95%   BMI 41.67 kg/m   General Appearance: Well nourished, in no apparent distress. Eyes: PERRLA, EOMs, conjunctiva no swelling or erythema Sinuses: No Frontal/maxillary tenderness ENT/Mouth: Ext aud canals clear, TMs without erythema, bulging. No erythema, swelling, or exudate on post pharynx.  Tonsils not swollen or erythematous. Hearing normal.  Neck: Supple, thyroid normal.  Respiratory: Respiratory effort normal, Crackles and expiratory wheezes throughout lung fields bilaterally. Nebulizer treatment given with DuoNeb- crackles are slightly less Cardio: RRR with no MRGs. Brisk peripheral pulses without edema.  Abdomen: Soft, + BS.  Non tender, no guarding, rebound, hernias, masses. Lymphatics: Non tender without lymphadenopathy.  Musculoskeletal: Full ROM, 5/5 strength, normal gait.  Skin: Warm, dry without rashes, lesions, ecchymosis.  Neuro: Cranial nerves intact. Normal muscle tone, no cerebellar symptoms. Sensation intact.  Psych: Awake and oriented X 3, normal affect, Insight and Judgment appropriate.     Alycia Rossetti, NP 11:27 AM [facility]

## 2022-04-29 NOTE — Patient Instructions (Signed)

## 2022-05-15 ENCOUNTER — Other Ambulatory Visit: Payer: Self-pay | Admitting: Internal Medicine

## 2022-05-15 DIAGNOSIS — R7303 Prediabetes: Secondary | ICD-10-CM

## 2022-05-28 ENCOUNTER — Ambulatory Visit: Payer: Medicare PPO | Admitting: Internal Medicine

## 2022-06-20 ENCOUNTER — Other Ambulatory Visit: Payer: Self-pay | Admitting: Nurse Practitioner

## 2022-06-30 ENCOUNTER — Encounter: Payer: Self-pay | Admitting: Internal Medicine

## 2022-06-30 NOTE — Patient Instructions (Signed)

## 2022-06-30 NOTE — Progress Notes (Unsigned)
.     Future Appointments  Date Time Provider Department  07/01/2022                  9 mo ov  10:30 AM Unk Pinto, MD [facility]  10/13/2022                 cpe 11:00 AM Unk Pinto, MD [facility]  03/16/2023               wellness 11:00 AM Darrol Jump, NP [facility]    History of Present Illness:       This very nice 67 y.o. MWF presents for 6  month follow up with HTN, HLD, Pre-Diabetes and Vitamin D Deficiency. Patient has hx/o Osteoporosis followed by her GYN office.  She's followed by Dr Carolyne Fiscal for iron def anemia .   At goal - 122/68.   Patient has had no complaints of any cardiac type chest pain, palpitations, dyspnea Vertell Limber /PND, dizziness, claudication or dependent edema.        Hyperlipidemia is controlled with diet & rosuvastatin. Patient denies myalgias or other med SE's. Last Lipids were at goal except elevated Trig's :  Lab Results  Component Value Date   CHOL 172 03/12/2022   HDL 49 (L) 03/12/2022   LDLCALC 92 03/12/2022   TRIG 222 (H) 03/12/2022   CHOLHDL 3.5 03/12/2022     Also, the patient has history Morbid Obesity (BMI 41+  in Nov 2023) and consequent hx/o prediabetes (A1c 5.9% /2011 & 5.8% /2016) of PreDiabetes and has had no symptoms of reactive hypoglycemia, diabetic polys, paresthesias or visual blurring.  Last A1c was at goal :  Lab Results  Component Value Date   HGBA1C 5.6 10/03/2021   Wt Readings from Last 3 Encounters:  07/01/22 231 lb 12.8 oz (105.1 kg)  04/29/22 227 lb 12.8 oz (103.3 kg)  03/12/22 226 lb (102.5 kg)                                                        Further, the patient also has history of Vitamin D Deficiency ("20" /2008 & "34" /2013) and supplements vitamin D without any suspected side-effects. Last vitamin D was still low  (goal 70-100) :  Lab Results  Component Value Date   VD25OH 48 10/03/2021       Current Outpatient Medications  Medication Instructions   albuterol HFA  inhaler 2   Inhalations  Every 6 hours PRN   atenolol (TENORMIN) 100 MG tablet TAKE 1 TABLET DAILY    VITAMIN D   5,000 Units , Daily   LEXAPRO 20 MG tablet TAKE 1 TABLET DAILY    Multiple Vitamins-Minerals (O 1 tablet Daily With iron and B vitamins   olmesartan 20 MG tablet Take  1/2 to 1 tablet  at night    phentermine  37.5 MG tablet TAKE 1 TABLET EVERY MORNING    rosuvastatin ( 20 MG tablet TAKE 1 TABLET DAILY     Allergies  Allergen Reactions   Lipitor [Atorvastatin]    Wellbutrin [Bupropion] dysphoria    PMHx:   Past Medical History:  Diagnosis Date   B12 deficienc    Hyperlipidemia    Hypertension    Obesity    Prediabetes    Vitamin D deficiency  Immunization History  Administered Date(s) Administered   Influenza Inj Mdck Quad  03/09/2019   Influenza, High Dose  02/21/2021   Influenza 04/28/2015, 02/26/2016, 03/09/2019   PFIZER-SARS-COV-2 Vacc 08/10/2019, 09/07/2019, 12/22/2020   PNEUMOCOCCAL -20 02/22/2021   PPD Test 06/18/2017, 07/28/2019, 10/01/2020   Pneumococcal - 23 06/17/2008   Td 04/18/2007   Tdap 06/18/2017   Zoster Recombinat (Shingrix) 07/30/2020, 10/19/2020     Past Surgical History:  Procedure Laterality Date   APPENDECTOMY     BREAST BIOPSY Right 1975   INGUINAL HERNIA REPAIR Bilateral age 23     FHx:    Reviewed / unchanged  SHx:    Reviewed / unchanged    Systems Review:  Constitutional: Denies fever, chills, wt changes, headaches, insomnia, fatigue, night sweats, change in appetite. Eyes: Denies redness, blurred vision, diplopia, discharge, itchy, watery eyes.  ENT: Denies discharge, congestion, post nasal drip, epistaxis, sore throat, earache, hearing loss, dental pain, tinnitus, vertigo, sinus pain, snoring.  CV: Denies chest pain, palpitations, irregular heartbeat, syncope, dyspnea, diaphoresis, orthopnea, PND, claudication or edema. Respiratory: denies cough, dyspnea, DOE, pleurisy, hoarseness, laryngitis, wheezing.  Gastrointestinal:  Denies dysphagia, odynophagia, heartburn, reflux, water brash, abdominal pain or cramps, nausea, vomiting, bloating, diarrhea, constipation, hematemesis, melena, hematochezia  or hemorrhoids. Genitourinary: Denies dysuria, frequency, urgency, nocturia, hesitancy, discharge, hematuria or flank pain. Musculoskeletal: Denies arthralgias, myalgias, stiffness, jt. swelling, pain, limping or strain/sprain.  Skin: Denies pruritus, rash, hives, warts, acne, eczema or change in skin lesion(s). Neuro: No weakness, tremor, incoordination, spasms, paresthesia or pain. Psychiatric: Denies confusion, memory loss or sensory loss. Endo: Denies change in weight, skin or hair change.  Heme/Lymph: No excessive bleeding, bruising or enlarged lymph nodes.  Physical Exam  BP 122/68   Pulse 80   Temp (!) 97.2 F (36.2 C)   Resp 16   Ht '5\' 2"'$  (1.575 m)   Wt 231 lb 12.8 oz (105.1 kg)   SpO2 97%   BMI 42.40 kg/m   Appears  over  nourished, well groomed  and in no distress.  Eyes: PERRLA, EOMs, conjunctiva no swelling or erythema. Sinuses: No frontal/maxillary tenderness ENT/Mouth: EAC's clear, TM's nl w/o erythema, bulging. Nares clear w/o erythema, swelling, exudates. Oropharynx clear without erythema or exudates. Oral hygiene is good. Tongue normal, non obstructing. Hearing intact.  Neck: Supple. Thyroid not palpable. Car 2+/2+ without bruits, nodes or JVD. Chest: Respirations nl with BS clear & equal w/o rales, rhonchi, wheezing or stridor.  Cor: Heart sounds normal w/ regular rate and rhythm without sig. murmurs, gallops, clicks or rubs. Peripheral pulses normal and equal  without edema.  Abdomen: Soft & bowel sounds normal. Non-tender w/o guarding, rebound, hernias, masses or organomegaly.  Lymphatics: Unremarkable.  Musculoskeletal: Full ROM all peripheral extremities, joint stability, 5/5 strength and normal gait.  Skin: Warm, dry without exposed rashes, lesions or ecchymosis apparent.  Neuro: Cranial  nerves intact, reflexes equal bilaterally. Sensory-motor testing grossly intact. Tendon reflexes grossly intact.  Pysch: Alert & oriented x 3.  Insight and judgement nl & appropriate. No ideations.  Assessment and Plan:   1. Morbid obesity ([place]) - BMI 40+  - TSH  2. Essential hypertension  -  monitor blood pressure at home.  - Continue DASH diet.  Reminder to go to the ER if any CP,  SOB, nausea, dizziness, severe HA, changes vision/speech.   - CBC with Differential/Platelet - COMPLETE METABOLIC PANEL WITH GFR - Magnesium - TSH  3. Hyperlipidemia, mixed    Continue diet/meds, exercise,& lifestyle  modifications.  - Continue monitor periodic cholesterol/liver & renal functions    - Lipid panel - TSH  4. Abnormal glucose   - Continue diet, exercise  - Lifestyle modifications.  - Monitor appropriate labs   - Hemoglobin A1c - Insulin, random  5. Vitamin D deficiency  - Continue supplementation   - VITAMIN D 25 Hydroxy   6. Medication management  - CBC with Differential/Platelet - COMPLETE METABOLIC PANEL WITH GFR - Magnesium - Lipid panel - TSH - Hemoglobin A1c - Insulin, random - VITAMIN D 25 Hydroxy          Discussed  regular exercise, BP monitoring, weight control to achieve/maintain BMI less than 25 and discussed med and SE's. Recommended labs to assess and monitor clinical status with further disposition pending results of labs.  I discussed the assessment and treatment plan with the patient. The patient was provided an opportunity to ask questions and all were answered. The patient agreed with the plan and demonstrated an understanding of the instructions.  I provided over 30 minutes of exam, counseling, chart review and  complex critical decision making.        The patient was advised to call back or seek an in-person evaluation if the symptoms worsen or if the condition fails to improve as anticipated.   Kirtland Bouchard, MD

## 2022-07-01 ENCOUNTER — Ambulatory Visit: Payer: Medicare PPO | Admitting: Internal Medicine

## 2022-07-01 ENCOUNTER — Encounter: Payer: Self-pay | Admitting: Internal Medicine

## 2022-07-01 DIAGNOSIS — E559 Vitamin D deficiency, unspecified: Secondary | ICD-10-CM

## 2022-07-01 DIAGNOSIS — E782 Mixed hyperlipidemia: Secondary | ICD-10-CM | POA: Diagnosis not present

## 2022-07-01 DIAGNOSIS — R7309 Other abnormal glucose: Secondary | ICD-10-CM | POA: Diagnosis not present

## 2022-07-01 DIAGNOSIS — F5101 Primary insomnia: Secondary | ICD-10-CM | POA: Diagnosis not present

## 2022-07-01 DIAGNOSIS — I10 Essential (primary) hypertension: Secondary | ICD-10-CM | POA: Diagnosis not present

## 2022-07-01 DIAGNOSIS — Z79899 Other long term (current) drug therapy: Secondary | ICD-10-CM

## 2022-07-01 MED ORDER — TRAZODONE HCL 150 MG PO TABS: ORAL_TABLET | ORAL | 3 refills | Status: DC

## 2022-07-02 ENCOUNTER — Other Ambulatory Visit: Payer: Self-pay | Admitting: Internal Medicine

## 2022-07-02 DIAGNOSIS — D519 Vitamin B12 deficiency anemia, unspecified: Secondary | ICD-10-CM

## 2022-07-02 DIAGNOSIS — E538 Deficiency of other specified B group vitamins: Secondary | ICD-10-CM

## 2022-07-02 DIAGNOSIS — D509 Iron deficiency anemia, unspecified: Secondary | ICD-10-CM

## 2022-07-02 DIAGNOSIS — Z79899 Other long term (current) drug therapy: Secondary | ICD-10-CM

## 2022-07-02 LAB — COMPLETE METABOLIC PANEL WITH GFR
AG Ratio: 1.4 (calc) (ref 1.0–2.5)
ALT: 10 U/L (ref 6–29)
AST: 14 U/L (ref 10–35)
Albumin: 4 g/dL (ref 3.6–5.1)
Alkaline phosphatase (APISO): 73 U/L (ref 37–153)
BUN: 18 mg/dL (ref 7–25)
CO2: 27 mmol/L (ref 20–32)
Calcium: 9.2 mg/dL (ref 8.6–10.4)
Chloride: 105 mmol/L (ref 98–110)
Creat: 0.76 mg/dL (ref 0.50–1.05)
Globulin: 2.8 g/dL (calc) (ref 1.9–3.7)
Glucose, Bld: 89 mg/dL (ref 65–99)
Potassium: 4.3 mmol/L (ref 3.5–5.3)
Sodium: 141 mmol/L (ref 135–146)
Total Bilirubin: 0.3 mg/dL (ref 0.2–1.2)
Total Protein: 6.8 g/dL (ref 6.1–8.1)
eGFR: 86 mL/min/{1.73_m2} (ref >=60)

## 2022-07-02 LAB — CBC WITH DIFFERENTIAL/PLATELET
Absolute Monocytes: 462 cells/uL (ref 200–950)
Basophils Absolute: 60 cells/uL (ref 0–200)
Basophils Relative: 0.9 %
Eosinophils Absolute: 201 cells/uL (ref 15–500)
Eosinophils Relative: 3 %
HCT: 33.4 % — ABNORMAL LOW (ref 35.0–45.0)
Hemoglobin: 9.9 g/dL — ABNORMAL LOW (ref 11.7–15.5)
Lymphs Abs: 1936 cells/uL (ref 850–3900)
MCH: 22.4 pg — ABNORMAL LOW (ref 27.0–33.0)
MCHC: 29.6 g/dL — ABNORMAL LOW (ref 32.0–36.0)
MCV: 75.6 fL — ABNORMAL LOW (ref 80.0–100.0)
MPV: 11.1 fL (ref 7.5–12.5)
Monocytes Relative: 6.9 %
Neutro Abs: 4040 cells/uL (ref 1500–7800)
Neutrophils Relative %: 60.3 %
Platelets: 284 10*3/uL (ref 140–400)
RBC: 4.42 10*6/uL (ref 3.80–5.10)
RDW: 15 % (ref 11.0–15.0)
Total Lymphocyte: 28.9 %
WBC: 6.7 10*3/uL (ref 3.8–10.8)

## 2022-07-02 LAB — HEMOGLOBIN A1C
Hgb A1c MFr Bld: 5.9 % of total Hgb — ABNORMAL HIGH (ref <5.7)
Mean Plasma Glucose: 123 mg/dL
eAG (mmol/L): 6.8 mmol/L

## 2022-07-02 LAB — LIPID PANEL
Cholesterol: 183 mg/dL (ref <200)
HDL: 49 mg/dL — ABNORMAL LOW (ref >=50)
LDL Cholesterol (Calc): 99 mg/dL (calc)
Non-HDL Cholesterol (Calc): 134 mg/dL (calc) — ABNORMAL HIGH (ref <130)
Total CHOL/HDL Ratio: 3.7 (calc) (ref <5.0)
Triglycerides: 229 mg/dL — ABNORMAL HIGH (ref <150)

## 2022-07-02 LAB — INSULIN, RANDOM: Insulin: 32.2 u[IU]/mL — ABNORMAL HIGH

## 2022-07-02 LAB — TSH: TSH: 2.7 mIU/L (ref 0.40–4.50)

## 2022-07-02 LAB — VITAMIN D 25 HYDROXY (VIT D DEFICIENCY, FRACTURES): Vit D, 25-Hydroxy: 42 ng/mL (ref 30–100)

## 2022-07-02 LAB — MAGNESIUM: Magnesium: 2 mg/dL (ref 1.5–2.5)

## 2022-07-02 NOTE — Progress Notes (Signed)
<><><><><><><><><><><><><><><><><><><><><><><><><><><><><><><><><> <><><><><><><><><><><><><><><><><><><><><><><><><><><><><><><><><> -   Test results slightly outside the reference range are not unusual. If there is anything important, I will review this with you,  otherwise it is considered normal test values.  If you have further questions,  please do not hesitate to contact me at the office or via My Chart.  <><><><><><><><><><><><><><><><><><><><><><><><><><><><><><><><><> <><><><><><><><><><><><><><><><><><><><><><><><><><><><><><><><><>  -  CBC shows worsening anemia.  - Please call office to schedule a Nurse visit  to check more labs to                                                                         help determine the cause of your anemia  <><><><><><><><><><><><><><><><><><><><><><><><><><><><><><><><><>

## 2022-07-03 ENCOUNTER — Telehealth: Payer: Self-pay

## 2022-07-03 ENCOUNTER — Other Ambulatory Visit: Payer: Self-pay | Admitting: Internal Medicine

## 2022-07-03 MED ORDER — DEXAMETHASONE 4 MG PO TABS: ORAL_TABLET | ORAL | 0 refills | Status: DC

## 2022-07-03 MED ORDER — CIPROFLOXACIN HCL 500 MG PO TABS: ORAL_TABLET | ORAL | 0 refills | Status: DC

## 2022-07-03 NOTE — Telephone Encounter (Signed)
Spoke with patient about her blood work results. She made a nurse visit to have more labs drawn. Told her I sent out hemoccult cards for her to do and send back in.

## 2022-07-07 ENCOUNTER — Ambulatory Visit (INDEPENDENT_AMBULATORY_CARE_PROVIDER_SITE_OTHER): Payer: Medicare PPO

## 2022-07-07 VITALS — BP 128/70 | HR 73 | Temp 97.8°F | Resp 16 | Ht 62.0 in | Wt 234.0 lb

## 2022-07-07 DIAGNOSIS — D509 Iron deficiency anemia, unspecified: Secondary | ICD-10-CM | POA: Diagnosis not present

## 2022-07-07 DIAGNOSIS — D519 Vitamin B12 deficiency anemia, unspecified: Secondary | ICD-10-CM | POA: Diagnosis not present

## 2022-07-07 DIAGNOSIS — E538 Deficiency of other specified B group vitamins: Secondary | ICD-10-CM | POA: Diagnosis not present

## 2022-07-07 DIAGNOSIS — D649 Anemia, unspecified: Secondary | ICD-10-CM | POA: Diagnosis not present

## 2022-07-07 DIAGNOSIS — Z79899 Other long term (current) drug therapy: Secondary | ICD-10-CM | POA: Diagnosis not present

## 2022-07-07 NOTE — Progress Notes (Signed)
The patient came in for repeat labs and voices no new concerns today.

## 2022-07-08 NOTE — Progress Notes (Signed)
<><><><><><><><><><><><><><><><><><><><><><><><><><><><><><><><><> <><><><><><><><><><><><><><><><><><><><><><><><><><><><><><><><><> -   Test results slightly outside the reference range are not unusual. If there is anything important, I will review this with you,  otherwise it is considered normal test values.  If you have further questions,  please do not hesitate to contact me at the office or via My Chart.  <><><><><><><><><><><><><><><><><><><><><><><><><><><><><><><><><> <><><><><><><><><><><><><><><><><><><><><><><><><><><><><><><><><>  -  Labs show anemia is slightly lower & iron is extremely low ,   So suggest schedule an office visit  to check  stool specimen for Blood   <><><><><><><><><><><><><><><><><><><><><><><><><><><><><><><><><> <><><><><><><><><><><><><><><><><><><><><><><><><><><><><><><><><>

## 2022-07-09 LAB — CBC WITH DIFFERENTIAL/PLATELET
Absolute Monocytes: 474 cells/uL (ref 200–950)
Basophils Absolute: 83 cells/uL (ref 0–200)
Basophils Relative: 1.3 %
Eosinophils Absolute: 218 cells/uL (ref 15–500)
Eosinophils Relative: 3.4 %
HCT: 32.1 % — ABNORMAL LOW (ref 35.0–45.0)
Hemoglobin: 9.5 g/dL — ABNORMAL LOW (ref 11.7–15.5)
Lymphs Abs: 1466 cells/uL (ref 850–3900)
MCH: 22.4 pg — ABNORMAL LOW (ref 27.0–33.0)
MCHC: 29.6 g/dL — ABNORMAL LOW (ref 32.0–36.0)
MCV: 75.7 fL — ABNORMAL LOW (ref 80.0–100.0)
MPV: 11 fL (ref 7.5–12.5)
Monocytes Relative: 7.4 %
Neutro Abs: 4160 cells/uL (ref 1500–7800)
Neutrophils Relative %: 65 %
Platelets: 264 10*3/uL (ref 140–400)
RBC: 4.24 10*6/uL (ref 3.80–5.10)
RDW: 15.2 % — ABNORMAL HIGH (ref 11.0–15.0)
Total Lymphocyte: 22.9 %
WBC: 6.4 10*3/uL (ref 3.8–10.8)

## 2022-07-09 LAB — IRON, TOTAL/TOTAL IRON BINDING CAP
%SAT: 5 % (calc) — ABNORMAL LOW (ref 16–45)
Iron: 25 ug/dL — ABNORMAL LOW (ref 45–160)
TIBC: 469 mcg/dL (calc) — ABNORMAL HIGH (ref 250–450)

## 2022-07-09 LAB — FERRITIN: Ferritin: 3 ng/mL — ABNORMAL LOW (ref 16–288)

## 2022-07-09 LAB — FOLATE RBC: RBC Folate: 788 ng/mL RBC (ref >280)

## 2022-07-09 LAB — RETICULOCYTES
ABS Retic: 76320 cells/uL (ref 20000–80000)
Retic Ct Pct: 1.8 %

## 2022-07-09 LAB — VITAMIN B12: Vitamin B-12: 1532 pg/mL — ABNORMAL HIGH (ref 200–1100)

## 2022-07-14 ENCOUNTER — Encounter: Payer: Self-pay | Admitting: Internal Medicine

## 2022-07-14 ENCOUNTER — Ambulatory Visit: Payer: Medicare PPO | Admitting: Internal Medicine

## 2022-07-14 VITALS — BP 138/72 | HR 71 | Temp 97.3°F | Resp 16 | Ht 62.0 in | Wt 231.6 lb

## 2022-07-14 DIAGNOSIS — D509 Iron deficiency anemia, unspecified: Secondary | ICD-10-CM

## 2022-07-14 DIAGNOSIS — Z79899 Other long term (current) drug therapy: Secondary | ICD-10-CM | POA: Diagnosis not present

## 2022-07-14 DIAGNOSIS — Z1211 Encounter for screening for malignant neoplasm of colon: Secondary | ICD-10-CM | POA: Diagnosis not present

## 2022-07-14 DIAGNOSIS — K922 Gastrointestinal hemorrhage, unspecified: Secondary | ICD-10-CM | POA: Diagnosis not present

## 2022-07-14 DIAGNOSIS — Z1212 Encounter for screening for malignant neoplasm of rectum: Secondary | ICD-10-CM | POA: Diagnosis not present

## 2022-07-14 LAB — CBC WITH DIFFERENTIAL/PLATELET
Absolute Monocytes: 581 cells/uL (ref 200–950)
Basophils Absolute: 83 cells/uL (ref 0–200)
Basophils Relative: 1 %
Eosinophils Absolute: 291 cells/uL (ref 15–500)
Eosinophils Relative: 3.5 %
HCT: 33.1 % — ABNORMAL LOW (ref 35.0–45.0)
Hemoglobin: 9.5 g/dL — ABNORMAL LOW (ref 11.7–15.5)
Lymphs Abs: 2266 cells/uL (ref 850–3900)
MCH: 21.6 pg — ABNORMAL LOW (ref 27.0–33.0)
MCHC: 28.7 g/dL — ABNORMAL LOW (ref 32.0–36.0)
MCV: 75.2 fL — ABNORMAL LOW (ref 80.0–100.0)
MPV: 11.5 fL (ref 7.5–12.5)
Monocytes Relative: 7 %
Neutro Abs: 5080 cells/uL (ref 1500–7800)
Neutrophils Relative %: 61.2 %
Platelets: 269 10*3/uL (ref 140–400)
RBC: 4.4 10*6/uL (ref 3.80–5.10)
RDW: 15 % (ref 11.0–15.0)
Total Lymphocyte: 27.3 %
WBC: 8.3 10*3/uL (ref 3.8–10.8)

## 2022-07-14 LAB — POC HEMOCCULT BLD/STL (HOME/3-CARD/SCREEN)
Card #2 Fecal Occult Blod, POC: NEGATIVE
Card #3 Fecal Occult Blood, POC: POSITIVE
Fecal Occult Blood, POC: POSITIVE — AB

## 2022-07-14 NOTE — Progress Notes (Signed)
Future Appointments  Date Time Provider Department  07/14/2022 10:30 AM Unk Pinto, MD [facility]  10/13/2022 11:00 AM Unk Pinto, MD [facility]  01/15/2023 11:00 AM Darrol Jump, NP [facility]  04/20/2023 10:30 AM Unk Pinto, MD [facility]    History of Present Illness:        This very nice 67 y.o. MWF presents for 6  month follow up with HTN, HLD, Pre-Diabetes and Vitamin D Deficiency who is also  followed by Dr Carolyne Fiscal for iron def anemia .  Recent labs 2 weeks ago showed microcytic anemia with Hgb having dropped over 9 month S from 12.3 gm% to 9.9 gm% .  One week ago Hgb had dropped to 9.5 gm%. Serum iron was down to 25 mcg/dl and 5 % saturation and Ferritin was down to 3 ng/ml.  Vit B12 was elevated at 1,532.  She's on no meds that would predispose to iron loss or deficiency.  Colonoscopy / EGD in 2019 was negative.    Current Outpatient Medications on File Prior to Visit  Medication Sig   albuterol HFA inhaler Inhale 2 puffs into the lungs every 6 hours as needed    atenolol  100 MG tablet TAKE 1 TABLET DAILY   VITAMIN D   5,000 Units Take daily.    B-12  Take daily   LEXAPRO 20 MG tablet TAKE 1 TABLET DAILY    FLONASE Place into the nose.   DAILY MULTIVITAWOMEN  Take 1 tablet  daily. With iron and B vitamins   olmesartan 20 MG tablet Take  1/2 to 1 tablet  at night f   phentermine 37.5 MG tablet TAKE 1 TABLET EVERY MORNING   rosuvastatin 20 MG tablet TAKE 1 TABLET DAILY    traZODone (150 MG tablet Take 1/2 to 1 tablet at Bedtime as needed       Allergies  Allergen Reactions   Lipitor [Atorvastatin]    Wellbutrin [Bupropion]     dysphoria     Problem list She has Essential hypertension; Hyperlipidemia, mixed; Morbid obesity ([place]) - BMI 40+; B12 deficiency; Vitamin D deficiency; Abnormal glucose; Screening for heart disease; Sjogren's syndrome ([place]); Anxiety; Osteoporosis; Symptomatic anemia; Iron deficiency anemia; Attention deficit; and FH:  hypertension on their problem list.   Observations/Objective:  BP 138/72   Pulse 71   Temp (!) 97.3 F (36.3 C)   Resp 16   Ht 5' 2"$  (1.575 m)   Wt 231 lb 9.6 oz (105.1 kg)   SpO2 96%   BMI 42.36 kg/m    HEENT - neg.  Neck - supple. No Br N JVD. Chest - clear = BS. Cor -  RRR w/o sig mgr. PP nl w/o edema. Abd - soft - benign w/o masses/tenderness & BS nl. DRE - No palpable masses . (+) Positive Hemoccult . MS - FROM w/o deformities. Neuro - WNL w/o focal abn. Skin - clear No cyanosis,icterus or rashes.   Assessment and Plan: . 1. Iron deficiency anemia, unspecified iron deficiency anemia type  - CBC with Differential/Platelet - POC Hemoccult Bld/Stl - - >> (+)   - Ambulatory referral to Gastroenterology  2. Gastrointestinal hemorrhage   Follow Up Instructions:        I discussed the assessment and treatment plan with the patient. The patient was provided an opportunity to ask questions and all were answered. The patient agreed with the plan and demonstrated an understanding of the instructions.       The patient was  advised to call back or seek an in-person evaluation if the symptoms worsen or if the condition fails to improve as anticipated.    Kirtland Bouchard, MD

## 2022-07-14 NOTE — Progress Notes (Signed)
-   Hgb / Red Cell count is stable at 9.5 gm%

## 2022-07-16 ENCOUNTER — Telehealth: Payer: Self-pay

## 2022-07-16 NOTE — Telephone Encounter (Signed)
-----   Message from Vladimir Crofts, [place] sent at 07/16/2022  8:06 AM EST ----- Regarding: FW: GI referral Morning! Can we try to get her into my first available in the office for anemia? Thanks, Estill Bamberg ----- Message ----- From: Unk Pinto, MD Sent: 07/14/2022   7:44 PM EST To: Vladimir Crofts, PA-C; Gatha Mayer, MD Subject: GI referral                                      Estill Bamberg ,                                              As we discussed I'm referring Whitney Herrera to see you   for consultation for severe Iron Deficiency and recent precipitous drop in Hgb.                                                  In 2019, she had a negative Colon & EGD by Medoff,   but given that he's retired, she prefers to remain in [place] for any procedures  &  establish   with [facility] .   Many Thanks ,   bill

## 2022-07-16 NOTE — Telephone Encounter (Signed)
Pt scheduled to see Vicie Mutters PA 07/22/22 at 1:30pm. Pt aware of appt.

## 2022-07-16 NOTE — Telephone Encounter (Signed)
-----   Message from Vladimir Crofts, [place] sent at 07/16/2022  8:06 AM EST ----- Regarding: FW: GI referral Morning! Can we try to get her into my first available in the office for anemia? Thanks, Estill Bamberg ----- Message ----- From: Unk Pinto, MD Sent: 07/14/2022   7:44 PM EST To: Vladimir Crofts, PA-C; Gatha Mayer, MD Subject: GI referral                                      Estill Bamberg ,                                              As we discussed I'm referring Whitney Herrera to see you   for consultation for severe Iron Deficiency and recent precipitous drop in Hgb.                                                  In 2019, she had a negative Colon & EGD by Medoff,   but given that he's retired, she prefers to remain in [place] for any procedures  &  establish   with [facility] .   Many Thanks ,   bill

## 2022-07-21 NOTE — Progress Notes (Unsigned)
07/22/2022 Whitney Herrera LC:[id number] [birth year]  Referring provider: Unk Pinto, MD Primary GI doctor: Dr. Carlean Purl ( Dr. Earlean Shawl)  ASSESSMENT AND PLAN:   Iron deficiency anemia, unspecified iron deficiency anemia type EGD/Colon 2019 for anemia unremarkable Some mild epigastric discomfort, no overt GI bleeding, positive FOBT 2 gram drop of HGB over 9 months Get back on oral iron, tolerated okay previous, take with juice, call if any issues.  Will schedule for recheck of CBC closer to colonoscopy/EGD which patient picked to schedule end of March.  If EGD/Colon is negative, consider VCE  Dyspepsia Recently started on benicar, no signs of enteropathy but will evaluate during EGD Lifestyle changes discussed, avoid NSAIDS, ETOH  Patient Care Team: Unk Pinto, MD as PCP - General (Internal Medicine) Arvella Nigh, MD as Consulting Physician (Obstetrics and Gynecology)  HISTORY OF PRESENT ILLNESS: 67 y.o. female with a past medical history of hypertension, hyperlipidemia, morbid obesity, B12 deficiency, vitamin D deficiency, Sjogren's syndrome, anxiety, osteoporosis, history of IDA and others listed below presents for evaluation of anemia.   04/2018 EGD and colonoscopy with Dr. Earlean Shawl for IDA, anemia corrected with oral supplementation, no repeat done.No VCE.  Follows with Dr. Irene Limbo for iron deficiency anemia, had iron infusion 2 years ago with him.   Per patient has had "fluctuation" per patient, as long as she is on the iron it has been doing okay but she stopped at least  6 months ago and her HGB has dropped over 9 months from 12.3 g to 9.9. Serum iron 25, percent saturation 5, ferritin 3.  B12 1532.  She has not seen any overt GI bleeding, she was positive FOBT at PCP.  Can have some epigastric AB pain after eating, once a week, depends on how much she eats and sometimes what she eats. Can have some nausea/unsettled feeling but no nausea/vomiting.  She denies GERD  recently, has occ, just takes tums as needed, no PPI.  No dysphagia, no melena, no hematochezia.  Denies AB bloating.  Will have BM 2 x day, mushy stools, occ more soft but never loose stools.  Patient is on olmesartan x 6 months.  Maternal aunt with UC, and mom with diverticula, no GI malignancy in family history.  Had COVID last month.  Has had some SOB, fatigue, no chest pain.  Rare ibuprofen, rare ETOH glass of wine a few times a week, no drug use, no tobacco use.   She  reports that she has never smoked. She has never used smokeless tobacco. She reports current alcohol use of about 2.0 standard drinks of alcohol per week. She reports that she does not use drugs.  RELEVANT LABS AND IMAGING: CBC    Component Value Date/Time   WBC 8.3 07/14/2022 0000   RBC 4.40 07/14/2022 0000   HGB 9.5 (L) 07/14/2022 0000   HGB 12.2 02/15/2021 1116   HGB 13.7 05/10/2013 1441   HCT 33.1 (L) 07/14/2022 0000   HCT 41.4 05/10/2013 1441   PLT 269 07/14/2022 0000   PLT 226 02/15/2021 1116   PLT 187 05/10/2013 1441   MCV 75.2 (L) 07/14/2022 0000   MCV 89.3 05/10/2013 1441   MCH 21.6 (L) 07/14/2022 0000   MCHC 28.7 (L) 07/14/2022 0000   RDW 15.0 07/14/2022 0000   RDW 13.6 05/10/2013 1441   LYMPHSABS 2,266 07/14/2022 0000   LYMPHSABS 1.7 05/10/2013 1441   MONOABS 0.6 02/15/2021 1116   MONOABS 0.6 05/10/2013 1441   EOSABS 291 07/14/2022 0000  EOSABS 0.3 05/10/2013 1441   BASOSABS 83 07/14/2022 0000   BASOSABS 0.1 05/10/2013 1441   Recent Labs    10/03/21 1111 03/12/22 1503 07/01/22 0000 07/07/22 0000 07/14/22 0000  HGB 12.3 10.7* 9.9* 9.5* 9.5*     CMP     Component Value Date/Time   NA 141 07/01/2022 0000   NA 141 05/10/2013 1441   K 4.3 07/01/2022 0000   K 4.1 05/10/2013 1441   CL 105 07/01/2022 0000   CO2 27 07/01/2022 0000   CO2 24 05/10/2013 1441   GLUCOSE 89 07/01/2022 0000   GLUCOSE 87 05/10/2013 1441   BUN 18 07/01/2022 0000   BUN 15.0 05/10/2013 1441   CREATININE  0.76 07/01/2022 0000   CREATININE 0.8 05/10/2013 1441   CALCIUM 9.2 07/01/2022 0000   CALCIUM 9.9 05/10/2013 1441   PROT 6.8 07/01/2022 0000   PROT 7.6 05/10/2013 1441   ALBUMIN 3.8 02/15/2021 1116   ALBUMIN 4.1 05/10/2013 1441   AST 14 07/01/2022 0000   AST 15 02/15/2021 1116   AST 17 05/10/2013 1441   ALT 10 07/01/2022 0000   ALT 13 02/15/2021 1116   ALT 18 05/10/2013 1441   ALKPHOS 77 02/15/2021 1116   ALKPHOS 73 05/10/2013 1441   BILITOT 0.3 07/01/2022 0000   BILITOT 0.4 02/15/2021 1116   BILITOT 0.33 05/10/2013 1441   GFRNONAA >60 02/15/2021 1116   GFRNONAA 90 10/01/2020 1503   GFRAA 104 10/01/2020 1503      Latest Ref Rng & Units 07/01/2022   12:00 AM 03/12/2022    3:03 PM 10/03/2021   11:11 AM  Hepatic Function  Total Protein 6.1 - 8.1 g/dL 6.8  6.9  7.0   AST 10 - 35 U/L 14  15  15   $ ALT 6 - 29 U/L 10  9  11   $ Total Bilirubin 0.2 - 1.2 mg/dL 0.3  0.3  0.4       Current Medications:    Current Outpatient Medications (Cardiovascular):    atenolol (TENORMIN) 100 MG tablet, TAKE 1 TABLET BY MOUTH ONCE DAILY FOR BLOOD PRESSURE (Patient taking differently: 25 mg. TAKE 1 TABLET BY MOUTH ONCE DAILY FOR BLOOD PRESSURE)   olmesartan (BENICAR) 20 MG tablet, Take  1/2 to 1 tablet  at night for BP   rosuvastatin (CRESTOR) 20 MG tablet, TAKE 1 TABLET DAILY FOR CHOLESTEROL  Current Outpatient Medications (Respiratory):    albuterol (VENTOLIN HFA) 108 (90 Base) MCG/ACT inhaler, Inhale 2 puffs into the lungs every 6 (six) hours as needed for wheezing or shortness of breath.   Fluticasone Propionate (FLONASE NA), Place into the nose.   Current Outpatient Medications (Hematological):    Cyanocobalamin (B-12 PO), Take by mouth.  Current Outpatient Medications (Other):    Cholecalciferol (VITAMIN D PO), Take 5,000 Units by mouth daily.    escitalopram (LEXAPRO) 20 MG tablet, TAKE 1 TABLET DAILY FOR MOOD   Multiple Vitamins-Minerals (ONE DAILY MULTIVITAMIN WOMEN PO), Take 1  tablet by mouth daily. With iron and B vitamins   phentermine (ADIPEX-P) 37.5 MG tablet, TAKE 1 TABLET EVERY MORNING FOR DIETING AND WEIGHT LOSS   traZODone (DESYREL) 150 MG tablet, Take 1/2 to 1 tablet at Bedtime as needed  for Sleep  Medical History:  Past Medical History:  Diagnosis Date   B12 deficiency    Prediabetes    Vitamin D deficiency    Allergies:  Allergies  Allergen Reactions   Lipitor [Atorvastatin]    Wellbutrin [Bupropion]  dysphoria     Surgical History:  She  has a past surgical history that includes Breast biopsy (Right, 1975); Appendectomy; and Inguinal hernia repair (Bilateral, age 33). Family History:  Her family history includes Alzheimer's disease in her father and mother; Hypertension in her father and mother.  REVIEW OF SYSTEMS  : All other systems reviewed and negative except where noted in the History of Present Illness.  PHYSICAL EXAM: BP 124/72   Pulse 68   Ht 5' 3"$  (1.6 m)   Wt 232 lb 12.8 oz (105.6 kg)   SpO2 96%   BMI 41.24 kg/m  General Appearance: Well nourished, in no apparent distress. Head:   Normocephalic and atraumatic. Eyes:  sclerae anicteric,conjunctive pink  Respiratory: Respiratory effort normal, BS equal bilaterally without rales, rhonchi, wheezing. Cardio: RRR with no MRGs. Peripheral pulses intact.  Abdomen: Soft,  Obese ,active bowel sounds. No tenderness . No masses. Rectal: Not evaluated Musculoskeletal: Full ROM, Normal gait. Without edema. Skin:  Dry and intact without significant lesions or rashes Neuro: Alert and  oriented x4;  No focal deficits. Psych:  Cooperative. Normal mood and affect.    Vladimir Crofts, PA-C 1:52 PM

## 2022-07-22 ENCOUNTER — Ambulatory Visit: Payer: Medicare PPO | Admitting: Physician Assistant

## 2022-07-22 ENCOUNTER — Encounter: Payer: Self-pay | Admitting: Physician Assistant

## 2022-07-22 VITALS — BP 124/72 | HR 68 | Ht 63.0 in | Wt 232.8 lb

## 2022-07-22 DIAGNOSIS — D509 Iron deficiency anemia, unspecified: Secondary | ICD-10-CM

## 2022-07-22 DIAGNOSIS — R1013 Epigastric pain: Secondary | ICD-10-CM | POA: Diagnosis not present

## 2022-07-22 MED ORDER — NA SULFATE-K SULFATE-MG SULF 17.5-3.13-1.6 GM/177ML PO SOLN: ORAL | 0 refills | Status: DC

## 2022-07-22 NOTE — Patient Instructions (Addendum)
You have been scheduled for an endoscopy and colonoscopy. Please follow the written instructions given to you at your visit today. Please pick up your prep supplies at the pharmacy within the next 1-3 days. If you use inhalers (even only as needed), please bring them with you on the day of your procedure.   Get on the oral iron, can take with small amount of juice to help absorption Stop a few days before colonoscopy Can recheck CBC a week before colonoscopy/EGD (08/19/22) Go to the ER if you have any severe weakness, severe AB pain, vomit blood, dark red blood in your bowel movement, shortenss of breath or chest pain.  If your blood pressure at your visit was 140/90 or greater, please contact your primary care physician to follow up on this.  If you are age 4 or older, your body mass index should be between 23-30. Your Body mass index is 41.24 kg/m. If this is out of the aforementioned range listed, please consider follow up with your Primary Care Provider.  If you are age 21 or younger, your body mass index should be between 19-25. Your Body mass index is 41.24 kg/m. If this is out of the aformentioned range listed, please consider follow up with your Primary Care Provider.   ________________________________________________________  The [facility] providers would like to encourage you to use [facility] to communicate with providers for non-urgent requests or questions.  Due to long hold times on the telephone, sending your provider a message by [facility] may be a faster and more efficient way to get a response.  Please allow 48 business hours for a response.  Please remember that this is for non-urgent requests.   Due to recent changes in healthcare laws, you may see the results of your imaging and laboratory studies on MyChart before your provider has had a chance to review them.  We understand that in some cases there may be results that are confusing or concerning to you. Not all laboratory  results come back in the same time frame and the provider may be waiting for multiple results in order to interpret others.  Please give [place] 48 hours in order for your provider to thoroughly review all the results before contacting the office for clarification of your results.    Thank you for entrusting me with your care and choosing [facility].  Vicie Mutters PA-C

## 2022-08-19 ENCOUNTER — Other Ambulatory Visit (INDEPENDENT_AMBULATORY_CARE_PROVIDER_SITE_OTHER): Payer: Medicare PPO

## 2022-08-19 DIAGNOSIS — D509 Iron deficiency anemia, unspecified: Secondary | ICD-10-CM

## 2022-08-19 LAB — CBC WITH DIFFERENTIAL/PLATELET
Basophils Absolute: 0.1 10*3/uL (ref 0.0–0.1)
Basophils Relative: 1 % (ref 0.0–3.0)
Eosinophils Absolute: 0.3 10*3/uL (ref 0.0–0.7)
Eosinophils Relative: 3.6 % (ref 0.0–5.0)
HCT: 32.9 % — ABNORMAL LOW (ref 36.0–46.0)
Hemoglobin: 10.1 g/dL — ABNORMAL LOW (ref 12.0–15.0)
Lymphocytes Relative: 28.3 % (ref 12.0–46.0)
Lymphs Abs: 2.2 10*3/uL (ref 0.7–4.0)
MCHC: 30.6 g/dL (ref 30.0–36.0)
MCV: 71.7 fl — ABNORMAL LOW (ref 78.0–100.0)
Monocytes Absolute: 0.6 10*3/uL (ref 0.1–1.0)
Monocytes Relative: 8.3 % (ref 3.0–12.0)
Neutro Abs: 4.5 10*3/uL (ref 1.4–7.7)
Neutrophils Relative %: 58.8 % (ref 43.0–77.0)
Platelets: 247 10*3/uL (ref 150.0–400.0)
RBC: 4.58 Mil/uL (ref 3.87–5.11)
RDW: 18 % — ABNORMAL HIGH (ref 11.5–15.5)
WBC: 7.7 10*3/uL (ref 4.0–10.5)

## 2022-08-26 ENCOUNTER — Encounter: Payer: Self-pay | Admitting: Internal Medicine

## 2022-08-26 ENCOUNTER — Ambulatory Visit: Payer: Medicare PPO | Admitting: Internal Medicine

## 2022-08-26 ENCOUNTER — Telehealth: Payer: Self-pay | Admitting: *Deleted

## 2022-08-26 VITALS — BP 131/52 | HR 57 | Temp 96.6°F | Ht 63.0 in | Wt 232.0 lb

## 2022-08-26 MED ORDER — NA SULFATE-K SULFATE-MG SULF 17.5-3.13-1.6 GM/177ML PO SOLN: 1.0000 | Freq: Once | ORAL | 0 refills | Status: AC

## 2022-08-26 MED ORDER — SODIUM CHLORIDE 0.9 % IV SOLN: 500.0000 mL | Freq: Once | INTRAVENOUS | Status: DC

## 2022-08-26 NOTE — Progress Notes (Signed)
Patient took her phentermine yesterday 08/25/22. Will have to cancel colonoscopy and endoscopy scheduled for today 08/26/22 because phentermine will need to be held for 10 days prior to procedures. MD made aware. Will work on rescheduling procedures.

## 2022-08-26 NOTE — Telephone Encounter (Signed)
Colonoscopy rescheduled d/t pt not being instructed to stop Phentermine. Prescription sent for Suprep to pt's pharmacy.

## 2022-09-10 ENCOUNTER — Ambulatory Visit (AMBULATORY_SURGERY_CENTER): Payer: Medicare PPO | Admitting: Internal Medicine

## 2022-09-10 ENCOUNTER — Encounter: Payer: Self-pay | Admitting: Internal Medicine

## 2022-09-10 VITALS — BP 125/60 | HR 58 | Temp 97.5°F | Resp 18 | Ht 63.0 in | Wt 232.0 lb

## 2022-09-10 DIAGNOSIS — D123 Benign neoplasm of transverse colon: Secondary | ICD-10-CM

## 2022-09-10 DIAGNOSIS — K2960 Other gastritis without bleeding: Secondary | ICD-10-CM

## 2022-09-10 DIAGNOSIS — R1013 Epigastric pain: Secondary | ICD-10-CM | POA: Diagnosis not present

## 2022-09-10 DIAGNOSIS — K2961 Other gastritis with bleeding: Secondary | ICD-10-CM

## 2022-09-10 DIAGNOSIS — R7303 Prediabetes: Secondary | ICD-10-CM | POA: Diagnosis not present

## 2022-09-10 DIAGNOSIS — D509 Iron deficiency anemia, unspecified: Secondary | ICD-10-CM

## 2022-09-10 MED ORDER — SODIUM CHLORIDE 0.9 % IV SOLN
500.0000 mL | Freq: Once | INTRAVENOUS | Status: DC
Start: 2022-09-10 — End: 2022-10-09

## 2022-09-10 NOTE — Patient Instructions (Addendum)
There was some stomach inflammation seen and biopsied and I took some intestine biopsies to check for iron malabsorption.  Two tiny colon polyps removed.  Once I see pathology will discuss.  I appreciate the opportunity to care for you. Iva Boop, MD, [facility]   - Resume previous diet. - Continue present medications. - Await pathology results. - No recommendation at this time regarding repeat colonoscopy due to age.   YOU HAD AN ENDOSCOPIC PROCEDURE TODAY AT THE [facility]:   Refer to the procedure report that was given to you for any specific questions about what was found during the examination.  If the procedure report does not answer your questions, please call your gastroenterologist to clarify.  If you requested that your care partner not be given the details of your procedure findings, then the procedure report has been included in a sealed envelope for you to review at your convenience later.  YOU SHOULD EXPECT: Some feelings of bloating in the abdomen. Passage of more gas than usual.  Walking can help get rid of the air that was put into your GI tract during the procedure and reduce the bloating. If you had a lower endoscopy (such as a colonoscopy or flexible sigmoidoscopy) you may notice spotting of blood in your stool or on the toilet paper. If you underwent a bowel prep for your procedure, you may not have a normal bowel movement for a few days.  Please Note:  You might notice some irritation and congestion in your nose or some drainage.  This is from the oxygen used during your procedure.  There is no need for concern and it should clear up in a day or so.  SYMPTOMS TO REPORT IMMEDIATELY:  Following lower endoscopy (colonoscopy or flexible sigmoidoscopy):  Excessive amounts of blood in the stool  Significant tenderness or worsening of abdominal pains  Swelling of the abdomen that is new, acute  Fever of 100F or higher  Following upper endoscopy  (EGD)  Vomiting of blood or coffee ground material  New chest pain or pain under the shoulder blades  Painful or persistently difficult swallowing  New shortness of breath  Fever of 100F or higher  Black, tarry-looking stools  For urgent or emergent issues, a gastroenterologist can be reached at any hour by calling [phone]. Do not use MyChart messaging for urgent concerns.    DIET:  We do recommend a small meal at first, but then you may proceed to your regular diet.  Drink plenty of fluids but you should avoid alcoholic beverages for 24 hours.  ACTIVITY:  You should plan to take it easy for the rest of today and you should NOT DRIVE or use heavy machinery until tomorrow (because of the sedation medicines used during the test).    FOLLOW UP: Our staff will call the number listed on your records the next business day following your procedure.  We will call around 7:15- 8:00 am to check on you and address any questions or concerns that you may have regarding the information given to you following your procedure. If we do not reach you, we will leave a message.     If any biopsies were taken you will be contacted by phone or by letter within the next 1-3 weeks.  Please call us at [phone] if you have not heard about the biopsies in 3 weeks.    SIGNATURES/CONFIDENTIALITY: You and/or your care partner have signed paperwork which will be entered into  your electronic medical record.  These signatures attest to the fact that that the information above on your After Visit Summary has been reviewed and is understood.  Full responsibility of the confidentiality of this discharge information lies with you and/or your care-partner.

## 2022-09-10 NOTE — Progress Notes (Unsigned)
[facility] History and Physical   Primary Care Physician:  Lucky Cowboy, MD   Reason for Procedure:   Iron deficiency anemia  Plan:    Colonoscopy and EGD     HPI: Whitney Herrera is a 67 y.o. female here to look for cause of iron deficiency anemia   Past Medical History:  Diagnosis Date   B12 deficiency    Prediabetes    Vitamin D deficiency     Past Surgical History:  Procedure Laterality Date   APPENDECTOMY     BREAST BIOPSY Right 06/02/1973   COLONOSCOPY     INGUINAL HERNIA REPAIR Bilateral age 65   UPPER GASTROINTESTINAL ENDOSCOPY      Prior to Admission medications   Medication Sig Start Date End Date Taking? Authorizing Provider  albuterol (VENTOLIN HFA) 108 (90 Base) MCG/ACT inhaler Inhale 2 puffs into the lungs every 6 (six) hours as needed for wheezing or shortness of breath. 04/29/22  Yes Raynelle Dick, NP  atenolol (TENORMIN) 100 MG tablet TAKE 1 TABLET BY MOUTH ONCE DAILY FOR BLOOD PRESSURE Patient taking differently: 25 mg. TAKE 1 TABLET BY MOUTH ONCE DAILY FOR BLOOD PRESSURE 06/22/22  Yes Cranford, Archie Patten, NP  Cholecalciferol (VITAMIN D PO) Take 5,000 Units by mouth daily.    Yes [provider]  Cyanocobalamin (B-12 PO) Take by mouth.   Yes [provider]  escitalopram (LEXAPRO) 20 MG tablet TAKE 1 TABLET DAILY FOR MOOD 11/14/21  Yes Judd Gaudier, NP  Multiple Vitamins-Minerals (ONE DAILY MULTIVITAMIN WOMEN PO) Take 1 tablet by mouth daily. With iron and B vitamins   Yes [provider]  olmesartan (BENICAR) 20 MG tablet Take  1/2 to 1 tablet  at night for BP 05/28/21  Yes Lucky Cowboy, MD  rosuvastatin (CRESTOR) 20 MG tablet TAKE 1 TABLET DAILY FOR CHOLESTEROL 11/06/21  Yes Raynelle Dick, NP  traZODone (DESYREL) 150 MG tablet Take 1/2 to 1 tablet at Bedtime as needed  for Sleep 07/01/22  Yes Lucky Cowboy, MD  Fluticasone Propionate (FLONASE NA) Place into the nose. Patient not taking: Reported on  09/10/2022    [provider]  phentermine (ADIPEX-P) 37.5 MG tablet TAKE 1 TABLET EVERY MORNING FOR DIETING AND WEIGHT LOSS 05/15/22   Lucky Cowboy, MD    Current Outpatient Medications  Medication Sig Dispense Refill   albuterol (VENTOLIN HFA) 108 (90 Base) MCG/ACT inhaler Inhale 2 puffs into the lungs every 6 (six) hours as needed for wheezing or shortness of breath. 8 g 2   atenolol (TENORMIN) 100 MG tablet TAKE 1 TABLET BY MOUTH ONCE DAILY FOR BLOOD PRESSURE (Patient taking differently: 25 mg. TAKE 1 TABLET BY MOUTH ONCE DAILY FOR BLOOD PRESSURE) 90 tablet 3   Cholecalciferol (VITAMIN D PO) Take 5,000 Units by mouth daily.      Cyanocobalamin (B-12 PO) Take by mouth.     escitalopram (LEXAPRO) 20 MG tablet TAKE 1 TABLET DAILY FOR MOOD 90 tablet 3   Multiple Vitamins-Minerals (ONE DAILY MULTIVITAMIN WOMEN PO) Take 1 tablet by mouth daily. With iron and B vitamins     olmesartan (BENICAR) 20 MG tablet Take  1/2 to 1 tablet  at night for BP 90 tablet 3   rosuvastatin (CRESTOR) 20 MG tablet TAKE 1 TABLET DAILY FOR CHOLESTEROL 90 tablet 1   traZODone (DESYREL) 150 MG tablet Take 1/2 to 1 tablet at Bedtime as needed  for Sleep 90 tablet 3   Fluticasone Propionate (FLONASE NA) Place into the nose. (  Patient not taking: Reported on 09/10/2022)     phentermine (ADIPEX-P) 37.5 MG tablet TAKE 1 TABLET EVERY MORNING FOR DIETING AND WEIGHT LOSS 90 tablet 1   Current Facility-Administered Medications  Medication Dose Route Frequency Provider Last Rate Last Admin   0.9 %  sodium chloride infusion  500 mL Intravenous Once Iva Boop, MD       0.9 %  sodium chloride infusion  500 mL Intravenous Once Iva Boop, MD        Allergies as of 09/10/2022 - Review Complete 09/10/2022  Allergen Reaction Noted   Lipitor [atorvastatin]  11/17/2011   Wellbutrin [bupropion]  04/17/2013    Family History  Problem Relation Age of Onset   Hypertension Mother    Alzheimer's disease Mother     Hypertension Father    Alzheimer's disease Father    Colon cancer Neg Hx    Rectal cancer Neg Hx    Stomach cancer Neg Hx    Esophageal cancer Neg Hx     Social History   Socioeconomic History   Marital status: Married    Spouse name: Not on file   Number of children: Not on file   Years of education: Not on file   Highest education level: Not on file  Occupational History   Not on file  Tobacco Use   Smoking status: Never   Smokeless tobacco: Never  Vaping Use   Vaping Use: Never used  Substance and Sexual Activity   Alcohol use: Yes    Alcohol/week: 2.0 standard drinks of alcohol    Types: 2 Standard drinks or equivalent per week    Comment: wine   Drug use: No   Sexual activity: Not on file  Other Topics Concern   Not on file  Social History Narrative   Not on file   Social Determinants of Health   Financial Resource Strain: Not on file  Food Insecurity: Not on file  Transportation Needs: Not on file  Physical Activity: Not on file  Stress: Not on file  Social Connections: Not on file  Intimate Partner Violence: Not on file    Review of Systems:  All other review of systems negative except as mentioned in the HPI.  Physical Exam: Vital signs BP 129/79   Pulse (!) 57   Temp (!) 97.5 F (36.4 C) (Temporal)   Resp 10   Ht 5\' 3"  (1.6 m)   Wt 232 lb (105.2 kg)   SpO2 95%   BMI 41.10 kg/m   General:   Alert,  Well-developed, well-nourished, pleasant and cooperative in NAD Lungs:  Clear throughout to auscultation.   Heart:  Regular rate and rhythm; no murmurs, clicks, rubs,  or gallops. Abdomen:  Soft, nontender and nondistended. Normal bowel sounds.   Neuro/Psych:  Alert and cooperative. Normal mood and affect. A and O x 3   @Nishita Isaacks  Sena Slate, MD, [facility] [phone] (pager) 09/10/2022 1:42 PM@

## 2022-09-10 NOTE — Op Note (Signed)
[facility] Patient Name: Whitney Herrera Procedure Date: 09/10/2022 1:20 PM MRN: [id number] Endoscopist: Iva Boop , MD, [id number] Age: 67 Referring MD:  Date of Birth: July 01, 1955 Gender: Female Account #: [id number] Procedure:                Colonoscopy Indications:              Iron deficiency anemia Medicines:                Monitored Anesthesia Care Procedure:                Pre-Anesthesia Assessment:                           - Prior to the procedure, a History and Physical                            was performed, and patient medications and                            allergies were reviewed. The patient's tolerance of                            previous anesthesia was also reviewed. The risks                            and benefits of the procedure and the sedation                            options and risks were discussed with the patient.                            All questions were answered, and informed consent                            was obtained. Prior Anticoagulants: The patient has                            taken no anticoagulant or antiplatelet agents. ASA                            Grade Assessment: III - A patient with severe                            systemic disease. After reviewing the risks and                            benefits, the patient was deemed in satisfactory                            condition to undergo the procedure.                           After obtaining informed consent, the colonoscope  was passed under direct vision. Throughout the                            procedure, the patient's blood pressure, pulse, and                            oxygen saturations were monitored continuously. The                            CF [id number] #[id number] was introduced through the anus                            and advanced to the the cecum, identified by                            appendiceal orifice and ileocecal  valve. The                            colonoscopy was performed without difficulty. The                            patient tolerated the procedure well. The quality                            of the bowel preparation was excellent. The                            ileocecal valve, appendiceal orifice, and rectum                            were photographed. Scope In: 1:55:11 PM Scope Out: 2:04:32 PM Scope Withdrawal Time: 0 hours 6 minutes 44 seconds  Total Procedure Duration: 0 hours 9 minutes 21 seconds  Findings:                 The perianal and digital rectal examinations were                            normal.                           Two sessile polyps were found in the transverse                            colon. The polyps were diminutive in size. These                            polyps were removed with a cold snare. Resection                            and retrieval were complete. Verification of                            patient identification for the specimen was done.  Estimated blood loss was minimal.                           Multiple diverticula were found in the sigmoid                            colon.                           The exam was otherwise without abnormality on                            direct and retroflexion views. Complications:            No immediate complications. Estimated Blood Loss:     Estimated blood loss was minimal. Impression:               - Two diminutive polyps in the transverse colon,                            removed with a cold snare. Resected and retrieved.                           - Diverticulosis in the sigmoid colon.                           - The examination was otherwise normal on direct                            and retroflexion views. Recommendation:           - Patient has a contact number available for                            emergencies. The signs and symptoms of potential                             delayed complications were discussed with the                            patient. Return to normal activities tomorrow.                            Written discharge instructions were provided to the                            patient.                           - Resume previous diet.                           - Continue present medications.                           - Await pathology results.                           -  No recommendation at this time regarding repeat                            colonoscopy due to age.                           - See the other procedure note for documentation of                            additional recommendations. Iva Boop, MD 09/10/2022 2:21:58 PM This report has been signed electronically.

## 2022-09-10 NOTE — Progress Notes (Unsigned)
Report given to PACU, vss 

## 2022-09-10 NOTE — Progress Notes (Unsigned)
VS completed by DT.  Pt's states no medical or surgical changes since previsit or office visit.  

## 2022-09-10 NOTE — Progress Notes (Signed)
Called to room to assist during endoscopic procedure.  Patient ID and intended procedure confirmed with present staff. Received instructions for my participation in the procedure from the performing physician.  

## 2022-09-10 NOTE — Op Note (Signed)
[facility] Patient Name: Whitney Herrera Procedure Date: 09/10/2022 1:21 PM MRN: [id number] Endoscopist: Iva Booparl E Saman Giddens , MD, [phone] Age: [age] Referring MD:  Date of Birth: 06-Jul-1955 Gender: Female Account #: [id number] Procedure:                Upper GI endoscopy Indications:              Iron deficiency anemia Medicines:                Monitored Anesthesia Care Procedure:                Pre-Anesthesia Assessment:                           - Prior to the procedure, a History and Physical                            was performed, and patient medications and                            allergies were reviewed. The patient's tolerance of                            previous anesthesia was also reviewed. The risks                            and benefits of the procedure and the sedation                            options and risks were discussed with the patient.                            All questions were answered, and informed consent                            was obtained. Prior Anticoagulants: The patient has                            taken no anticoagulant or antiplatelet agents. ASA                            Grade Assessment: III - A patient with severe                            systemic disease. After reviewing the risks and                            benefits, the patient was deemed in satisfactory                            condition to undergo the procedure.                           After obtaining informed consent, the endoscope was  passed under direct vision. Throughout the                            procedure, the patient's blood pressure, pulse, and                            oxygen saturations were monitored continuously. The                            GIF [id number] #[id number] was introduced through the                            mouth, and advanced to the second part of duodenum.                            The upper GI endoscopy was  accomplished without                            difficulty. The patient tolerated the procedure                            well. Scope In: Scope Out: Findings:                 Multiple dispersed small erosions with stigmata of                            recent bleeding were found in the gastric body.                            Biopsies were taken with a cold forceps for                            histology. Verification of patient identification                            for the specimen was done. Estimated blood loss was                            minimal.                           A 5 cm hiatal hernia was present.                           The exam was otherwise without abnormality.                           The cardia and gastric fundus were normal on                            retroflexion.                           Biopsies for histology were taken with a cold  forceps in the entire duodenum for evaluation of                            celiac disease. Verification of patient                            identification for the specimen was done. Estimated                            blood loss was minimal. Complications:            No immediate complications. Estimated Blood Loss:     Estimated blood loss was minimal. Impression:               - Erosive gastropathy with stigmata of recent                            bleeding. Biopsied. These were below the hiatal                            hernia, I think so do not seem to be Sheria Lang                            erosions                           - 5 cm hiatal hernia.                           - The examination was otherwise normal.                           - Biopsies were taken with a cold forceps for                            evaluation of celiac disease. Recommendation:           - Patient has a contact number available for                            emergencies. The signs and symptoms of potential                             delayed complications were discussed with the                            patient. Return to normal activities tomorrow.                            Written discharge instructions were provided to the                            patient.                           - See the other procedure note for documentation of  additional recommendations.                           - Await pathology results. consider UGI to look at                            hiatal hernia further Iva Boop, MD 09/10/2022 2:19:13 PM This report has been signed electronically.

## 2022-09-11 ENCOUNTER — Telehealth: Payer: Self-pay | Admitting: *Deleted

## 2022-09-11 NOTE — Telephone Encounter (Signed)
  Follow up Call-     09/10/2022   12:46 PM 08/26/2022   10:30 AM  Call back number  Post procedure Call Back phone  # [phone]  Permission to leave phone message Yes Yes     Patient questions:  Do you have a fever, pain , or abdominal swelling? No. Pain Score  0 *  Have you tolerated food without any problems? Yes.    Have you been able to return to your normal activities? Yes.    Do you have any questions about your discharge instructions: Diet   No. Medications  No. Follow up visit  No.  Do you have questions or concerns about your Care? No.  Actions: * If pain score is 4 or above: No action needed, pain <4.

## 2022-09-22 ENCOUNTER — Ambulatory Visit: Payer: Medicare PPO | Admitting: Nurse Practitioner

## 2022-09-22 ENCOUNTER — Encounter: Payer: Self-pay | Admitting: Nurse Practitioner

## 2022-09-22 VITALS — BP 120/70 | HR 56 | Temp 97.7°F | Ht 63.0 in | Wt 234.2 lb

## 2022-09-22 DIAGNOSIS — J302 Other seasonal allergic rhinitis: Secondary | ICD-10-CM

## 2022-09-22 DIAGNOSIS — Z79899 Other long term (current) drug therapy: Secondary | ICD-10-CM | POA: Diagnosis not present

## 2022-09-22 DIAGNOSIS — J4520 Mild intermittent asthma, uncomplicated: Secondary | ICD-10-CM | POA: Diagnosis not present

## 2022-09-22 DIAGNOSIS — D509 Iron deficiency anemia, unspecified: Secondary | ICD-10-CM | POA: Diagnosis not present

## 2022-09-22 LAB — CBC WITH DIFFERENTIAL/PLATELET
Basophils Absolute: 58 cells/uL (ref 0–200)
Eosinophils Absolute: 187 cells/uL (ref 15–500)
Lymphs Abs: 1685 cells/uL (ref 850–3900)
MCH: 22 pg — ABNORMAL LOW (ref 27.0–33.0)
MCHC: 29.5 g/dL — ABNORMAL LOW (ref 32.0–36.0)
MCV: 74.6 fL — ABNORMAL LOW (ref 80.0–100.0)
Monocytes Relative: 8 %
Neutro Abs: 4694 cells/uL (ref 1500–7800)
RDW: 16.9 % — ABNORMAL HIGH (ref 11.0–15.0)
Total Lymphocyte: 23.4 %

## 2022-09-22 NOTE — Progress Notes (Signed)
Assessment and Plan:  Whitney Herrera was seen today for an episodic.  Diagnoses and all order for this visit:  There are no diagnoses linked to this encounter.  Iron deficiency anemia, unspecified iron deficiency anemia type Continue iron supplement Discussed foods rich in iron such as green leafy vegetables and lean red meat  - CBC with Differential/Platelet - Iron, TIBC and Ferritin Panel  Mild intermittent asthma without complication Continue Ventolin Avoid triggers  Seasonal allergies Recommended to restart OTC antihistamine Zyrtec Avoid triggers Continue to monitor  Morbid obesity (HCC) - BMI 40+ Discussed appropriate BMI Diet modification. Physical activity. Encouraged/praised to build confidence.  Medication management All medications discussed and reviewed in full. All questions and concerns regarding medications addressed.    - CBC with Differential/Platelet - Iron, TIBC and Ferritin Panel  Notify office for further evaluation and treatment, questions or concerns if s/s fail to improve. The risks and benefits of my recommendations, as well as other treatment options were discussed with the patient today. Questions were answered.  Further disposition pending results of labs. Discussed med's effects and SE's.    Over 20 minutes of exam, counseling, chart review, and critical decision making was performed.   Future Appointments  Date Time Provider Department Center  10/13/2022 11:00 AM Lucky Cowboy, MD [facility] None  01/15/2023 11:00 AM Whitney Glimpse, NP [facility] None  04/20/2023 10:30 AM Lucky Cowboy, MD [facility] None    ------------------------------------------------------------------------------------------------------------------   HPI BP 120/70   Pulse (!) 56   Temp 97.7 F (36.5 C)   Ht  (1.6 m)   Wt 234 lb 3.2 oz (106.2 kg)   SpO2 98%   BMI 41.49 kg/m   67 y.o.female presents for evaluation of SOB with DOE.  Had an  episode 5 days ago when walking across parking lot.  States there was an incline.  She was walking to a restaurant and when she got inside to try and catch her breath it was difficult.  She has a hx of IDA and asthma.  She did not have her Albuterol inhaler during that time.  She is unsure if it is allergy/asthma attack or if her iron is too low.  She denies any CP, heart palpitations, syncopal type symtpoms. Denies any recent blood in stool or urine.   She did have recent colonoscopy with Dr. Leone Payor on 09/10/22 with 10 year recall.    Today she reports no SOB, symptoms have resolved.  She had no issues walking from the parking lot to the office.  She has been walking her do around the block these last few days without any exacerbations or difficulty breathing.   Past Medical History:  Diagnosis Date   B12 deficiency    Prediabetes    Vitamin D deficiency      Allergies  Allergen Reactions   Lipitor [Atorvastatin]    Wellbutrin [Bupropion]     dysphoria    Current Outpatient Medications on File Prior to Visit  Medication Sig   albuterol (VENTOLIN HFA) 108 (90 Base) MCG/ACT inhaler Inhale 2 puffs into the lungs every 6 (six) hours as needed for wheezing or shortness of breath.   atenolol (TENORMIN) 100 MG tablet TAKE 1 TABLET BY MOUTH ONCE DAILY FOR BLOOD PRESSURE (Patient taking differently: 25 mg. TAKE 1 TABLET BY MOUTH ONCE DAILY FOR BLOOD PRESSURE)   Cholecalciferol (VITAMIN D PO) Take 5,000 Units by mouth daily.    Cyanocobalamin (B-12 PO) Take by mouth.   escitalopram (LEXAPRO) 20 MG  tablet TAKE 1 TABLET DAILY FOR MOOD   Multiple Vitamins-Minerals (ONE DAILY MULTIVITAMIN WOMEN PO) Take 1 tablet by mouth daily. With iron and B vitamins   olmesartan (BENICAR) 20 MG tablet Take  1/2 to 1 tablet  at night for BP   phentermine (ADIPEX-P) 37.5 MG tablet TAKE 1 TABLET EVERY MORNING FOR DIETING AND WEIGHT LOSS   rosuvastatin (CRESTOR) 20 MG tablet TAKE 1 TABLET DAILY FOR CHOLESTEROL    traZODone (DESYREL) 150 MG tablet Take 1/2 to 1 tablet at Bedtime as needed  for Sleep   Fluticasone Propionate (FLONASE NA) Place into the nose. (Patient not taking: Reported on 09/10/2022)   Current Facility-Administered Medications on File Prior to Visit  Medication   0.9 %  sodium chloride infusion    ROS: all negative except what is noted in the HPI.   Physical Exam:  BP 120/70   Pulse (!) 56   Temp 97.7 F (36.5 C)   Ht  (1.6 m)   Wt 234 lb 3.2 oz (106.2 kg)   SpO2 98%   BMI 41.49 kg/m   General Appearance: NAD.  Awake, conversant and cooperative. Eyes: PERRLA, EOMs intact.  Sclera white.  Conjunctiva without erythema. Sinuses: No frontal/maxillary tenderness.  No nasal discharge. Nares patent.  ENT/Mouth: Ext aud canals clear.  Bilateral TMs w/DOL and without erythema or bulging. Hearing intact.  Posterior pharynx without swelling or exudate.  Tonsils without swelling or erythema.  Neck: Supple.  No masses, nodules or thyromegaly. Respiratory: Effort is regular with non-labored breathing. Breath sounds are equal bilaterally without rales, rhonchi, wheezing or stridor.  Cardio: RRR with no MRGs. Brisk peripheral pulses without edema.  Abdomen: Active BS in all four quadrants.  Soft and non-tender without guarding, rebound tenderness, hernias or masses. Lymphatics: Non tender without lymphadenopathy.  Musculoskeletal: Full ROM, 5/5 strength, normal ambulation.  No clubbing or cyanosis. Skin: Appropriate color for ethnicity. Warm without rashes, lesions, ecchymosis, ulcers.  Neuro: CN II-XII grossly normal. Normal muscle tone without cerebellar symptoms and intact sensation.   Psych: AO X 3,  appropriate mood and affect, insight and judgment.     Whitney Glimpse, NP 11:17 AM [facility]

## 2022-09-22 NOTE — Patient Instructions (Signed)

## 2022-09-23 LAB — CBC WITH DIFFERENTIAL/PLATELET
Absolute Monocytes: 576 cells/uL (ref 200–950)
Basophils Relative: 0.8 %
Eosinophils Relative: 2.6 %
HCT: 32.9 % — ABNORMAL LOW (ref 35.0–45.0)
Hemoglobin: 9.7 g/dL — ABNORMAL LOW (ref 11.7–15.5)
MPV: 11.5 fL (ref 7.5–12.5)
Neutrophils Relative %: 65.2 %
Platelets: 276 10*3/uL (ref 140–400)
RBC: 4.41 10*6/uL (ref 3.80–5.10)
WBC: 7.2 10*3/uL (ref 3.8–10.8)

## 2022-09-23 LAB — IRON,TIBC AND FERRITIN PANEL
%SAT: 18 % (calc) (ref 16–45)
Ferritin: 4 ng/mL — ABNORMAL LOW (ref 16–288)
Iron: 91 ug/dL (ref 45–160)
TIBC: 503 mcg/dL (calc) — ABNORMAL HIGH (ref 250–450)

## 2022-09-26 ENCOUNTER — Other Ambulatory Visit: Payer: Self-pay

## 2022-09-26 DIAGNOSIS — K449 Diaphragmatic hernia without obstruction or gangrene: Secondary | ICD-10-CM

## 2022-10-03 ENCOUNTER — Ambulatory Visit (HOSPITAL_COMMUNITY)
Admission: RE | Admit: 2022-10-03 | Discharge: 2022-10-03 | Disposition: A | Discharge status: Home / Self Care | Payer: Medicare PPO | Source: Ambulatory Visit | Attending: Internal Medicine | Admitting: Internal Medicine

## 2022-10-03 ENCOUNTER — Other Ambulatory Visit: Payer: Self-pay | Admitting: Internal Medicine

## 2022-10-03 DIAGNOSIS — K224 Dyskinesia of esophagus: Secondary | ICD-10-CM | POA: Diagnosis not present

## 2022-10-03 DIAGNOSIS — K449 Diaphragmatic hernia without obstruction or gangrene: Secondary | ICD-10-CM | POA: Insufficient documentation

## 2022-10-03 DIAGNOSIS — K219 Gastro-esophageal reflux disease without esophagitis: Secondary | ICD-10-CM | POA: Diagnosis not present

## 2022-10-09 ENCOUNTER — Encounter: Payer: Self-pay | Admitting: Internal Medicine

## 2022-10-09 ENCOUNTER — Telehealth: Payer: Self-pay | Admitting: Internal Medicine

## 2022-10-09 ENCOUNTER — Other Ambulatory Visit: Payer: Self-pay | Admitting: Internal Medicine

## 2022-10-09 NOTE — Telephone Encounter (Signed)
Reviewed upper GI series with patient.  Was suggesting the possibility of hiatal hernia repair but then we discovered that BMI is greater than 40.  Hiatal hernia repair surgery not possible unless BMI less than 35.  Whitney Herrera is not interested in bariatric surgery which would be a possibility in this case, sometimes they do combined surgery.   She has tried [employer] and other programs in the past and uses some phentermine.  We had a good conversation I encouraged her to keep trying for weight loss as it might help her hiatal hernia even without surgery.  I am going to give her information about a dietitian I know with a 12-week weight loss program to consider  I will send that via MyChart

## 2022-10-13 ENCOUNTER — Encounter: Payer: Self-pay | Admitting: Internal Medicine

## 2022-10-13 ENCOUNTER — Ambulatory Visit (INDEPENDENT_AMBULATORY_CARE_PROVIDER_SITE_OTHER): Payer: Medicare PPO | Admitting: Internal Medicine

## 2022-10-13 VITALS — BP 110/68 | HR 65 | Temp 97.9°F | Resp 16 | Ht 62.0 in | Wt 235.6 lb

## 2022-10-13 DIAGNOSIS — R7309 Other abnormal glucose: Secondary | ICD-10-CM

## 2022-10-13 DIAGNOSIS — I10 Essential (primary) hypertension: Secondary | ICD-10-CM | POA: Diagnosis not present

## 2022-10-13 DIAGNOSIS — M3500 Sicca syndrome, unspecified: Secondary | ICD-10-CM

## 2022-10-13 DIAGNOSIS — M810 Age-related osteoporosis without current pathological fracture: Secondary | ICD-10-CM | POA: Diagnosis not present

## 2022-10-13 DIAGNOSIS — Z79899 Other long term (current) drug therapy: Secondary | ICD-10-CM

## 2022-10-13 DIAGNOSIS — Z136 Encounter for screening for cardiovascular disorders: Secondary | ICD-10-CM | POA: Diagnosis not present

## 2022-10-13 DIAGNOSIS — Z0000 Encounter for general adult medical examination without abnormal findings: Secondary | ICD-10-CM

## 2022-10-13 DIAGNOSIS — E559 Vitamin D deficiency, unspecified: Secondary | ICD-10-CM | POA: Diagnosis not present

## 2022-10-13 DIAGNOSIS — Z0001 Encounter for general adult medical examination with abnormal findings: Secondary | ICD-10-CM

## 2022-10-13 DIAGNOSIS — E782 Mixed hyperlipidemia: Secondary | ICD-10-CM | POA: Diagnosis not present

## 2022-10-13 DIAGNOSIS — Z8249 Family history of ischemic heart disease and other diseases of the circulatory system: Secondary | ICD-10-CM

## 2022-10-13 DIAGNOSIS — J4521 Mild intermittent asthma with (acute) exacerbation: Secondary | ICD-10-CM

## 2022-10-13 MED ORDER — FLUTICASONE-SALMETEROL 250-50 MCG/ACT IN AEPB
INHALATION_SPRAY | RESPIRATORY_TRACT | 3 refills | Status: DC
Start: 2022-10-13 — End: 2023-10-12

## 2022-10-13 MED ORDER — DEXAMETHASONE 4 MG PO TABS: ORAL_TABLET | ORAL | 1 refills | Status: DC

## 2022-10-13 NOTE — Progress Notes (Signed)
Annual Screening/Preventative Visit &  Comprehensive Evaluation &  Examination   Future Appointments  Date Time Provider Department  10/13/2022                   cpe 11:00 AM Lucky Cowboy, MD [facility]  01/15/2023                  wellness 11:00 AM [name], NP [facility]  04/20/2023                  6 mo  10:30 AM Lucky Cowboy, MD [facility]  10/20/2023                  cp[e 11:00 AM Lucky Cowboy, MD [facility]          This very nice 67 y.o. MWF presents for a Screening /Preventative Visit & comprehensive evaluation and management of multiple medical co-morbidities.  Patient has been followed for HTN, HLD, T2_NIDDM  Prediabetes  and Vitamin D Deficiency. Patient has Morbid Obesity (BMI 40+) . Patient has hx/o Osteoporosis followed by her GYN office.  She's also followed by Dr Estrellita Ludwig for iron def anemia .  Patient has been dx'd with Sjogren's Syndrome  for oral xerostomia and had seen Rheumatologist  - Dr Deanne Coffer in the past.  Also, she is c/o dry cough & wheezes with the high pollen counts recently.         HTN predates since  2005. Patient's BP has been controlled at home  on Atenolol 100 mg x 1/4 qam  and Olmesartan 40 mg x 1/2 qpm and patient denies any cardiac symptoms as chest pain, palpitations, shortness of breath, dizziness or ankle swelling. Today's BP is at goal  - 110/68 .       Patient's hyperlipidemia is controlled with diet and medications. Patient denies myalgias or other medication SE's. Last lipids were near goal  with LDL 99 & Trig's 229 ::  Lab Results  Component Value Date   CHOL 183 07/01/2022   HDL 49 (L) 07/01/2022   LDLCALC 99 07/01/2022   TRIG 229 (H) 07/01/2022   CHOLHDL 3.7 07/01/2022         Patient has  Morbid Obesity (BMI 43+ /Feb 2021) and consequent hx/o prediabetes (A1c 5.9% /2011 & 5.8% /2016) and patient denies reactive hypoglycemic symptoms, visual blurring, diabetic polys or paresthesias. Last A1c was near goal:  Lab  Results  Component Value Date   HGBA1C 5.9 (H) 07/01/2022         Finally, patient has history of Vitamin D Deficiency ("20" /2008 & "34" /2013) and last Vitamin D was  not at goal (70-100) :  Lab Results  Component Value Date   VD25OH 42 07/01/2022        Current Outpatient Medications on File Prior to Visit  Medication Sig   atenolol 100 MG tablet TAKE 1 TABLET DAILY    VITAMIN D   5,000 Units Take  daily.    Vitamin B-12 2500 MCG SL Place 1 tablet under the tongue every other day.   LEXAPRO 20 MG tablet TAKE 1 TABLET DAILY    NIFEREX  150 MG Take 1 capsule  daily.   Multiple Vitamins-Minerals  Take 1 tablet  daily. With iron and B vitamins   olmesartan 20 MG tablet Take  1/2 to 1 tablet  at night    rosuvastatin  20 MG tablet Take 1 tablet Daily f  Allergies  Allergen Reactions   Lipitor [Atorvastatin]    Wellbutrin [Bupropion]     dysphoria     Past Medical History:  Diagnosis Date   B12 deficiency    Prediabetes    Vitamin D deficiency      Health Maintenance  Topic Date Due   PAP SMEAR-Modifier  Never done   MAMMOGRAM  06/25/2020   COVID-19 Vaccine (4 - Booster for Pfizer series) 02/16/2021   INFLUENZA VACCINE  12/31/2021   TETANUS/TDAP  06/19/2027   Pneumonia Vaccine 77+ Years old  Completed   DEXA SCAN  Completed   Hepatitis C Screening  Completed   HIV Screening  Completed   Zoster Vaccines- Shingrix  Completed   HPV VACCINES  Aged Out     Immunization History  Administered Date(s) Administered   Influenza Inj Mdck Quad 03/09/2019   Influenza, High Dose 02/21/2021   Influenza 04/28/2015, 02/26/2016, 03/09/2019   PFIZER-SARS-COV-2 Vacc 08/10/2019, 09/07/2019, 12/22/2020   PNEUMOCOCCAL -20 02/22/2021   PPD Test 06/18/2017, 07/28/2019, 10/01/2020   Pneumococcal-23 06/17/2008   Td 04/18/2007   Tdap 06/18/2017   Zoster Recombinat (Shingrix) 07/30/2020, 10/19/2020    Last Colon & EGD 09/10/2022  - Dr   Leone Payor - Negative - Recc  7 yr    f/u due Nov 2029   Last MGM -  Last MGM  (10/24/2021) &   dexaBMD 06/24/2018 at [facility]     Past Surgical History:  Procedure Laterality Date   APPENDECTOMY     BREAST BIOPSY Right 1975   INGUINAL HERNIA REPAIR Bilateral age 8     Family History  Problem Relation Age of Onset   Hypertension Mother    Alzheimer's disease Mother    Hypertension Father    Alzheimer's disease Father      Social History   Tobacco Use   Smoking status: Never   Smokeless tobacco: Never  Substance Use Topics   Alcohol use: Yes    Alcohol/week: 2.0 standard drinks    Types: 2 Standard drinks or equivalent per week    Comment: wine   Drug use: No      ROS Constitutional: Denies fever, chills, weight loss/gain, headaches, insomnia,  night sweats, and change in appetite. Does c/o fatigue. Eyes: Denies redness, blurred vision, diplopia, discharge, itchy, watery eyes.  ENT: Denies discharge, congestion, post nasal drip, epistaxis, sore throat, earache, hearing loss, dental pain, Tinnitus, Vertigo, Sinus pain, snoring.  Cardio: Denies chest pain, palpitations, irregular heartbeat, syncope, dyspnea, diaphoresis, orthopnea, PND, claudication, edema Respiratory: denies cough, dyspnea, DOE, pleurisy, hoarseness, laryngitis, wheezing.  Gastrointestinal: Denies dysphagia, heartburn, reflux, water brash, pain, cramps, nausea, vomiting, bloating, diarrhea, constipation, hematemesis, melena, hematochezia, jaundice, hemorrhoids Genitourinary: Denies dysuria, frequency, urgency, nocturia, hesitancy, discharge, hematuria, flank pain Breast: Breast lumps, nipple discharge, bleeding.  Musculoskeletal: Denies arthralgia, myalgia, stiffness, Jt. Swelling, pain, limp, and strain/sprain. Denies falls. Skin: Denies puritis, rash, hives, warts, acne, eczema, changing in skin lesion Neuro: No weakness, tremor, incoordination, spasms, paresthesia, pain Psychiatric: Denies confusion, memory loss,  sensory loss. Denies Depression. Endocrine: Denies change in weight, skin, hair change, nocturia, and paresthesia, diabetic polys, visual blurring, hyper / hypo glycemic episodes.  Heme/Lymph: No excessive bleeding, bruising, enlarged lymph nodes.  Physical Exam  BP 110/68   Pulse 65   Temp 97.9 F (36.6 C)   Resp 16   Ht 5\' 2"  (1.575 m)   Wt 235 lb 9.6 oz (106.9 kg)   SpO2 99%   BMI 43.09 kg/m  General Appearance:  Over nourished, well groomed and in no apparent distress. Dry wheezy tracheal cough.  Eyes: PERRLA, EOMs, conjunctiva no swelling or erythema, normal fundi and vessels. Sinuses: No frontal/maxillary tenderness ENT/Mouth: EACs patent / TMs  nl. Nares clear without erythema, swelling, mucoid exudates. Oral hygiene is good. No erythema, swelling, or exudate. Tongue normal, non-obstructing. Tonsils not swollen or erythematous. Hearing normal.  Neck: Supple, thyroid not palpable. No bruits, nodes or JVD. Respiratory: Respiratory effort normal.  BS equal and clear bilateral without rales, rhonci, or stridor,                                 but does demonstrate post-tussive expiratory fine to medium wheezes.wheezing . Cardio: Heart sounds are normal with regular rate and rhythm and no murmurs, rubs or gallops. Peripheral pulses are normal and equal bilaterally without edema. No aortic or femoral bruits. Chest: symmetric with normal excursions and percussion. Breasts: Deferred to [facility] .  Abdomen: Rotund , soft with bowel sounds active. Nontender, no guarding, rebound, hernias, masses, or organomegaly.  Lymphatics: Non tender without lymphadenopathy.  Musculoskeletal: Full ROM all peripheral extremities, joint stability, 5/5 strength, and normal gait. Skin: Warm and dry without rashes, lesions, cyanosis, clubbing or  ecchymosis.  Neuro: Cranial nerves intact, reflexes equal bilaterally. Normal muscle tone, no cerebellar symptoms. Sensation intact.  Pysch: Alert and oriented X 3,  normal affect, Insight and Judgment appropriate.    Assessment and Plan  1. Annual Preventative Screening Examination   2. Essential hypertension  - EKG 12-Lead - Urinalysis, Routine w reflex microscopic - Microalbumin / creatinine urine ratio - COMPLETE METABOLIC PANEL WITH GFR - Magnesium - TSH  3. Hyperlipidemia, mixed  - EKG 12-Lead - Lipid panel - TSH  4. Abnormal glucose  - EKG 12-Lead - Hemoglobin A1c - Insulin, random  5. Vitamin D deficiency  - VITAMIN D 25 Hydroxy   6. Sjogren's syndrome(HCC)   7. Mild intermittent asthmatic bronchitis with acute exacerbation  - dexamethasone (DECADRON) 4 MG tablet;  Take 1 tab 3 x day - 3 days, then 2 x day - 3 days, then 1 tab daily   Dispense: 20 tablet; Refill: 1  - fluticasone-salmeterol (ADVAIR) 250-50 MCG/ACT AEPB;  Use 1 Inhalation 2 x /day ( every 12 hours) for Allergic Asthma   Dispense: 180 each; Refill: 3  8. Morbid obesity (HCC) - BMI 40+  - TSH  9. Osteoporosis   - COMPLETE METABOLIC PANEL WITH GFR  10. Screening for heart disease  - EKG 12-Lead  11. FH: hypertension  - EKG 12-Lead  12. Medication management  - Urinalysis, Routine w reflex microscopic - Microalbumin / creatinine urine ratio - COMPLETE METABOLIC PANEL WITH GFR - Magnesium - Lipid panel - TSH - Hemoglobin A1c - Insulin, random - VITAMIN D 25 Hydroxy           Patient was counseled in prudent diet to achieve/maintain BMI less than 25 for weight control, BP monitoring, regular exercise and medications. Discussed med's effects and SE's. Screening labs and tests as requested with regular follow-up as recommended. Over 40 minutes of exam, counseling, chart review and high complex critical decision making was performed.  Also gave patient a Sample Breztri Inhaler & spacer  & instructed in use    Marinus Maw, MD

## 2022-10-13 NOTE — Patient Instructions (Signed)

## 2022-10-14 LAB — COMPLETE METABOLIC PANEL WITH GFR
AG Ratio: 1.6 (calc) (ref 1.0–2.5)
ALT: 12 U/L (ref 6–29)
AST: 17 U/L (ref 10–35)
Albumin: 3.9 g/dL (ref 3.6–5.1)
Alkaline phosphatase (APISO): 69 U/L (ref 37–153)
BUN: 16 mg/dL (ref 7–25)
CO2: 31 mmol/L (ref 20–32)
Calcium: 9.6 mg/dL (ref 8.6–10.4)
Chloride: 104 mmol/L (ref 98–110)
Creat: 0.72 mg/dL (ref 0.50–1.05)
Globulin: 2.5 g/dL (calc) (ref 1.9–3.7)
Glucose, Bld: 92 mg/dL (ref 65–99)
Potassium: 4.8 mmol/L (ref 3.5–5.3)
Sodium: 141 mmol/L (ref 135–146)
Total Bilirubin: 0.3 mg/dL (ref 0.2–1.2)
Total Protein: 6.4 g/dL (ref 6.1–8.1)
eGFR: 92 mL/min/{1.73_m2} (ref >=60)

## 2022-10-14 LAB — LIPID PANEL
Cholesterol: 164 mg/dL (ref <200)
HDL: 49 mg/dL — ABNORMAL LOW (ref >=50)
LDL Cholesterol (Calc): 88 mg/dL (calc)
Non-HDL Cholesterol (Calc): 115 mg/dL (calc) (ref <130)
Total CHOL/HDL Ratio: 3.3 (calc) (ref <5.0)
Triglycerides: 178 mg/dL — ABNORMAL HIGH (ref <150)

## 2022-10-14 LAB — URINALYSIS, ROUTINE W REFLEX MICROSCOPIC
Bilirubin Urine: NEGATIVE
Glucose, UA: NEGATIVE
Hgb urine dipstick: NEGATIVE
Ketones, ur: NEGATIVE
Leukocytes,Ua: NEGATIVE
Nitrite: NEGATIVE
Protein, ur: NEGATIVE
Specific Gravity, Urine: 1.014 (ref 1.001–1.035)
pH: 7 (ref 5.0–8.0)

## 2022-10-14 LAB — MICROALBUMIN / CREATININE URINE RATIO
Creatinine, Urine: 57 mg/dL (ref 20–275)
Microalb Creat Ratio: 12 mg/g creat (ref <30)
Microalb, Ur: 0.7 mg/dL

## 2022-10-14 LAB — VITAMIN D 25 HYDROXY (VIT D DEFICIENCY, FRACTURES): Vit D, 25-Hydroxy: 53 ng/mL (ref 30–100)

## 2022-10-14 LAB — INSULIN, RANDOM: Insulin: 15.5 u[IU]/mL

## 2022-10-14 LAB — HEMOGLOBIN A1C
Hgb A1c MFr Bld: 5.9 % of total Hgb — ABNORMAL HIGH (ref <5.7)
Mean Plasma Glucose: 123 mg/dL
eAG (mmol/L): 6.8 mmol/L

## 2022-10-14 LAB — MAGNESIUM: Magnesium: 2.1 mg/dL (ref 1.5–2.5)

## 2022-10-14 LAB — TSH: TSH: 2.59 mIU/L (ref 0.40–4.50)

## 2022-10-25 ENCOUNTER — Other Ambulatory Visit: Payer: Self-pay | Admitting: Nurse Practitioner

## 2022-11-18 ENCOUNTER — Ambulatory Visit: Payer: Medicare PPO | Admitting: Nurse Practitioner

## 2022-11-18 ENCOUNTER — Encounter: Payer: Self-pay | Admitting: Nurse Practitioner

## 2022-11-18 VITALS — BP 122/72 | HR 62 | Temp 97.6°F | Ht 62.0 in | Wt 235.2 lb

## 2022-11-18 DIAGNOSIS — R062 Wheezing: Secondary | ICD-10-CM

## 2022-11-18 DIAGNOSIS — R0989 Other specified symptoms and signs involving the circulatory and respiratory systems: Secondary | ICD-10-CM

## 2022-11-18 DIAGNOSIS — B370 Candidal stomatitis: Secondary | ICD-10-CM | POA: Diagnosis not present

## 2022-11-18 DIAGNOSIS — R051 Acute cough: Secondary | ICD-10-CM

## 2022-11-18 DIAGNOSIS — R6889 Other general symptoms and signs: Secondary | ICD-10-CM

## 2022-11-18 MED ORDER — PREDNISONE 10 MG PO TABS
ORAL_TABLET | ORAL | 0 refills | Status: DC
Start: 2022-11-18 — End: 2022-12-23

## 2022-11-18 MED ORDER — AZITHROMYCIN 250 MG PO TABS
ORAL_TABLET | ORAL | 1 refills | Status: DC
Start: 2022-11-18 — End: 2022-12-23

## 2022-11-18 MED ORDER — IPRATROPIUM-ALBUTEROL 0.5-2.5 (3) MG/3ML IN SOLN
3.0000 mL | Freq: Once | RESPIRATORY_TRACT | Status: DC
Start: 2022-11-18 — End: 2023-10-12

## 2022-11-18 MED ORDER — NYSTATIN 100000 UNIT/ML MT SUSP
OROMUCOSAL | 0 refills | Status: DC
Start: 2022-11-18 — End: 2023-10-12

## 2022-11-18 NOTE — Progress Notes (Signed)
Assessment and Plan:  Whitney Herrera was seen today for an episodic visit.  Diagnoses and all order for this visit:  1. Flu-like symptoms Post Flu Positive   2. Chest congestion Stay well hydrated to keep mucus thin and productive Start Z-Pak as directed Duoneb administered - tolerated well  - azithromycin (ZITHROMAX) 250 MG tablet; Take 2 tablets on  Day 1,  followed by 1 tablet  daily for 4 more days    for Sinusitis  /Bronchitis  Dispense: 6 each; Refill: 1 - ipratropium-albuterol (DUONEB) 0.5-2.5 (3) MG/3ML nebulizer solution 3 mL  3. Wheezing Duoneb administered - tolerated well Continue Albuterol PRN - rinse mouth - sensitive to increase for oral thrush Start steroid taper Report to ER for any increase in difficulty breathing.   - predniSONE (DELTASONE) 10 MG tablet; 1 tab 3 x day for 2 days, then 1 tab 2 x day for 2 days, then 1 tab 1 x day for 3 days  Dispense: 13 tablet; Refill: 0 - ipratropium-albuterol (DUONEB) 0.5-2.5 (3) MG/3ML nebulizer solution 3 mL  4. Acute cough Duoneb administered - toleratd well.  Continue Albuterol PRN - rinse mouth - sensitive to increase for oral thrush Continue Promethazine cough medication  - ipratropium-albuterol (DUONEB) 0.5-2.5 (3) MG/3ML nebulizer solution 3 mL  5. Oral thrush Rinse mouth after use of respiratory inhaler Start nystatin suspension Continue to monitor  - nystatin (MYCOSTATIN) 100000 UNIT/ML suspension; Swish 5 mL in the mouth and retain for as long as possible (several minutes) before swallowing.  Dispense: 60 mL; Refill: 0   Notify office for further evaluation and treatment, questions or concerns if s/s fail to improve. The risks and benefits of my recommendations, as well as other treatment options were discussed with the patient today. Questions were answered.  Further disposition pending results of labs. Discussed med's effects and SE's.    Over 20 minutes of exam, counseling, chart review, and critical  decision making was performed.   Future Appointments  Date Time Provider Department Center  01/15/2023 11:00 AM [name], NP [facility] None  04/20/2023 10:30 AM Lucky Cowboy, MD [facility] None  07/21/2023 10:30 AM [name], NP [facility] None  10/20/2023 11:00 AM Lucky Cowboy, MD [facility] None    ------------------------------------------------------------------------------------------------------------------   HPI BP 122/72   Pulse 62   Temp 97.6 F (36.4 C)   Ht 5\' 2"  (1.575 m)   Wt 235 lb 3.2 oz (106.7 kg)   SpO2 98%   BMI 43.02 kg/m    Patient complains of symptoms of a URI. Symptoms include left ear pressure/pain, congestion, cough described as productive, nasal congestion, and wheezing. Onset of symptoms was 10 days ago, and has been unchanged since that time. Treatment to date:  Tamiflu .  Patient returned yesterday from an [place] cruise.  Tested positive for Flu on cruise ship.  Was treated with Tamiflu.  Continues to have lingering symptoms.  Has been using Albuterol inhaler, has noticed increase in white substance on back of tongue with burning sensation. She has Promethazine cough syrup at home which is effective.  Also been taking Nyquil.   Past Medical History:  Diagnosis Date   B12 deficiency    Prediabetes    Vitamin D deficiency      Allergies  Allergen Reactions   Lipitor [Atorvastatin]    Wellbutrin [Bupropion]     dysphoria    Current Outpatient Medications on File Prior to Visit  Medication Sig   albuterol (VENTOLIN HFA) 108 (90 Base)  MCG/ACT inhaler Inhale 2 puffs into the lungs every 6 (six) hours as needed for wheezing or shortness of breath.   atenolol (TENORMIN) 100 MG tablet TAKE 1 TABLET BY MOUTH ONCE DAILY FOR BLOOD PRESSURE (Patient taking differently: 25 mg. TAKE 1 TABLET BY MOUTH ONCE DAILY FOR BLOOD PRESSURE)   Cholecalciferol (VITAMIN D PO) Take 5,000 Units by mouth daily.    Cyanocobalamin (B-12 PO) Take by mouth.    escitalopram (LEXAPRO) 20 MG tablet TAKE 1 TABLET DAILY FOR MOOD   Fluticasone Propionate (FLONASE NA) Place into the nose.   fluticasone-salmeterol (ADVAIR) 250-50 MCG/ACT AEPB Use 1 Inhalation 2 x /day ( every 12 hours) for Allergic Asthma   Multiple Vitamins-Minerals (ONE DAILY MULTIVITAMIN WOMEN PO) Take 1 tablet by mouth daily. With iron and B vitamins   olmesartan (BENICAR) 20 MG tablet Take  1/2 to 1 tablet  at night for BP   phentermine (ADIPEX-P) 37.5 MG tablet TAKE 1 TABLET EVERY MORNING FOR DIETING AND WEIGHT LOSS   rosuvastatin (CRESTOR) 20 MG tablet Take  1 tablet  Daily for Cholesterol                                                        /                                            TAKE 1                                        BY                                              MOUTH   traZODone (DESYREL) 150 MG tablet Take 1/2 to 1 tablet at Bedtime as needed  for Sleep   dexamethasone (DECADRON) 4 MG tablet Take 1 tab 3 x day - 3 days, then 2 x day - 3 days, then 1 tab daily   No current facility-administered medications on file prior to visit.    ROS: all negative except what is noted in the HPI.   Physical Exam:  BP 122/72   Pulse 62   Temp 97.6 F (36.4 C)   Ht 5\' 2"  (1.575 m)   Wt 235 lb 3.2 oz (106.7 kg)   SpO2 98%   BMI 43.02 kg/m   General Appearance: NAD.  Awake, conversant and cooperative. Eyes: PERRLA, EOMs intact.  Sclera white.  Conjunctiva without erythema. Sinuses: Frontal/maxillary tenderness.  No nasal discharge. Nares patent.  ENT/Mouth: Ext aud canals clear.  Bilateral TMs w/DOL and without erythema or bulging. Hearing intact.  Posterior pharynx without swelling or exudate.  Tonsils without swelling or erythema. Posterior tongue with mildly light colored white patches.  Neck: Supple.  No masses, nodules or thyromegaly. Respiratory: Effort is regular with non-labored breathing. Breath sounds are equal bilaterally posterior wheezing left and right  lobe.  Cardio: RRR with no MRGs. Brisk peripheral pulses without edema.  Abdomen: Active BS in all  four quadrants.  Soft and non-tender without guarding, rebound tenderness, hernias or masses. Lymphatics: Non tender without lymphadenopathy.  Musculoskeletal: Full ROM, 5/5 strength, normal ambulation.  No clubbing or cyanosis. Skin: Appropriate color for ethnicity. Warm without rashes, lesions, ecchymosis, ulcers.  Neuro: CN II-XII grossly normal. Normal muscle tone without cerebellar symptoms and intact sensation.   Psych: AO X 3,  appropriate mood and affect, insight and judgment.     [name], NP 11:45 AM [facility]

## 2022-11-18 NOTE — Patient Instructions (Signed)

## 2022-11-27 ENCOUNTER — Encounter: Payer: Self-pay | Admitting: Nurse Practitioner

## 2022-11-27 DIAGNOSIS — J189 Pneumonia, unspecified organism: Secondary | ICD-10-CM

## 2022-11-28 ENCOUNTER — Ambulatory Visit
Admission: RE | Admit: 2022-11-28 | Discharge: 2022-11-28 | Disposition: A | Discharge status: Home / Self Care | Payer: Medicare PPO | Source: Ambulatory Visit | Attending: Nurse Practitioner | Admitting: Nurse Practitioner

## 2022-11-28 ENCOUNTER — Other Ambulatory Visit: Payer: Self-pay | Admitting: Nurse Practitioner

## 2022-11-28 DIAGNOSIS — R062 Wheezing: Secondary | ICD-10-CM

## 2022-11-28 DIAGNOSIS — R059 Cough, unspecified: Secondary | ICD-10-CM | POA: Diagnosis not present

## 2022-11-28 DIAGNOSIS — R0989 Other specified symptoms and signs involving the circulatory and respiratory systems: Secondary | ICD-10-CM

## 2022-11-28 DIAGNOSIS — R0602 Shortness of breath: Secondary | ICD-10-CM

## 2022-11-28 DIAGNOSIS — R051 Acute cough: Secondary | ICD-10-CM

## 2022-11-28 DIAGNOSIS — R918 Other nonspecific abnormal finding of lung field: Secondary | ICD-10-CM | POA: Diagnosis not present

## 2022-11-28 MED ORDER — DOXYCYCLINE HYCLATE 100 MG PO TABS: 100.0000 mg | ORAL_TABLET | Freq: Two times a day (BID) | ORAL | 0 refills | Status: AC

## 2022-12-09 ENCOUNTER — Ambulatory Visit
Admission: RE | Admit: 2022-12-09 | Discharge: 2022-12-09 | Disposition: A | Discharge status: Home / Self Care | Payer: Medicare PPO | Source: Ambulatory Visit | Attending: Nurse Practitioner | Admitting: Nurse Practitioner

## 2022-12-09 DIAGNOSIS — J189 Pneumonia, unspecified organism: Secondary | ICD-10-CM

## 2022-12-09 DIAGNOSIS — K449 Diaphragmatic hernia without obstruction or gangrene: Secondary | ICD-10-CM | POA: Diagnosis not present

## 2022-12-23 ENCOUNTER — Encounter: Payer: Self-pay | Admitting: Nurse Practitioner

## 2022-12-23 ENCOUNTER — Ambulatory Visit: Payer: Medicare PPO | Admitting: Nurse Practitioner

## 2022-12-23 VITALS — BP 120/78 | HR 73 | Temp 97.5°F | Ht 62.0 in | Wt 235.8 lb

## 2022-12-23 DIAGNOSIS — Z8701 Personal history of pneumonia (recurrent): Secondary | ICD-10-CM | POA: Diagnosis not present

## 2022-12-23 DIAGNOSIS — R0789 Other chest pain: Secondary | ICD-10-CM

## 2022-12-23 MED ORDER — PREDNISONE 10 MG PO TABS: ORAL_TABLET | ORAL | 0 refills | Status: DC

## 2022-12-23 NOTE — Progress Notes (Signed)
Assessment and Plan:  Whitney Herrera was seen today for an episodic visit.  Diagnoses and all order for this visit:  Costochondral chest pain Start Prednisone as directed May continue to take IBU and Tylenol as needed  Continue to monitor for any increase in fever, chills, difficulty breathing, pink frothy sputum  Contact office if s/s fail to improve.  History of recent pneumonia Resolved Continue to monitor  Meds ordered this encounter  Medications   predniSONE (DELTASONE) 10 MG tablet    Sig: 1 tab 3 x day for 2 days, then 1 tab 2 x day for 2 days, then 1 tab 1 x day for 3 days    Dispense:  13 tablet    Refill:  0    Order Specific Question:   Supervising Provider    Answer:   Lucky Cowboy [phone]   Continue to monitor for any increase in fever, chills, N/V, diarrhea, changes to bowel habits, blood in stool.  Notify office for further evaluation and treatment, questions or concerns if s/s fail to improve. The risks and benefits of my recommendations, as well as other treatment options were discussed with the patient today. Questions were answered.  Further disposition pending results of labs. Discussed med's effects and SE's.    Over 15 minutes of exam, counseling, chart review, and critical decision making was performed.   Future Appointments  Date Time Provider Department Center  01/15/2023 11:00 AM [name], NP [facility] None  04/20/2023 10:30 AM Lucky Cowboy, MD [facility] None  07/21/2023 10:30 AM [name], NP [facility] None  10/20/2023 11:00 AM Lucky Cowboy, MD [facility] None    ------------------------------------------------------------------------------------------------------------------   HPI BP 120/78   Pulse 73   Temp (!) 97.5 F (36.4 C)   Ht 5\' 2"  (1.575 m)   Wt 235 lb 12.8 oz (107 kg)   SpO2 98%   BMI 43.13 kg/m   67 y.o.female presents for evaluation of pain along bilateral lower anterior costophrenic angles of ribs  after recent being trated for PNA.  Initial x-ray on 11/28/22 note mild PNA in the hilar region.  Patient completed course of abx and had updated x-ray image 12/09/22 that was clear.  Since then she has continued to have the pain.  She has taken 400 mg IBU with some relief.  Pain is worse in the evening and better after resting and upon wakening.  She denies any associated SOB, wheezing, pink frothy sputum, fever.  She does note tenderness to palpation to the area.    Past Medical History:  Diagnosis Date   B12 deficiency    Prediabetes    Vitamin D deficiency      Allergies  Allergen Reactions   Lipitor [Atorvastatin]    Wellbutrin [Bupropion]     dysphoria    Current Outpatient Medications on File Prior to Visit  Medication Sig   albuterol (VENTOLIN HFA) 108 (90 Base) MCG/ACT inhaler Inhale 2 puffs into the lungs every 6 (six) hours as needed for wheezing or shortness of breath.   atenolol (TENORMIN) 100 MG tablet TAKE 1 TABLET BY MOUTH ONCE DAILY FOR BLOOD PRESSURE (Patient taking differently: 25 mg. TAKE 1 TABLET BY MOUTH ONCE DAILY FOR BLOOD PRESSURE)   Cholecalciferol (VITAMIN D PO) Take 5,000 Units by mouth daily.    Cyanocobalamin (B-12 PO) Take by mouth.   escitalopram (LEXAPRO) 20 MG tablet TAKE 1 TABLET DAILY FOR MOOD   Fluticasone Propionate (FLONASE NA) Place into the nose.   fluticasone-salmeterol (ADVAIR)  250-50 MCG/ACT AEPB Use 1 Inhalation 2 x /day ( every 12 hours) for Allergic Asthma   Multiple Vitamins-Minerals (ONE DAILY MULTIVITAMIN WOMEN PO) Take 1 tablet by mouth daily. With iron and B vitamins   nystatin (MYCOSTATIN) 100000 UNIT/ML suspension Swish 5 mL in the mouth and retain for as long as possible (several minutes) before swallowing.   olmesartan (BENICAR) 20 MG tablet Take  1/2 to 1 tablet  at night for BP   phentermine (ADIPEX-P) 37.5 MG tablet TAKE 1 TABLET EVERY MORNING FOR DIETING AND WEIGHT LOSS   rosuvastatin (CRESTOR) 20 MG tablet Take  1 tablet  Daily  for Cholesterol                                                        /                                            TAKE 1                                        BY                                              MOUTH   traZODone (DESYREL) 150 MG tablet Take 1/2 to 1 tablet at Bedtime as needed  for Sleep   azithromycin (ZITHROMAX) 250 MG tablet Take 2 tablets on  Day 1,  followed by 1 tablet  daily for 4 more days    for Sinusitis  /Bronchitis   predniSONE (DELTASONE) 10 MG tablet 1 tab 3 x day for 2 days, then 1 tab 2 x day for 2 days, then 1 tab 1 x day for 3 days   Current Facility-Administered Medications on File Prior to Visit  Medication   ipratropium-albuterol (DUONEB) 0.5-2.5 (3) MG/3ML nebulizer solution 3 mL    ROS: all negative except what is noted in the HPI.   Physical Exam:  BP 120/78   Pulse 73   Temp (!) 97.5 F (36.4 C)   Ht 5\' 2"  (1.575 m)   Wt 235 lb 12.8 oz (107 kg)   SpO2 98%   BMI 43.13 kg/m   General Appearance: NAD.  Awake, conversant and cooperative. Eyes: PERRLA, EOMs intact.  Sclera white.  Conjunctiva without erythema. Sinuses: No frontal/maxillary tenderness.  No nasal discharge. Nares patent.  ENT/Mouth: Ext aud canals clear.  Bilateral TMs w/DOL and without erythema or bulging. Hearing intact.  Posterior pharynx without swelling or exudate.  Tonsils without swelling or erythema.  Neck: Supple.  No masses, nodules or thyromegaly. Respiratory: Effort is regular with non-labored breathing. Breath sounds are equal bilaterally without rales, rhonchi, wheezing or stridor. Tender to palpation along anterior costophrenic angles. Cardio: RRR with no MRGs. Brisk peripheral pulses without edema.  Abdomen: Active BS in all four quadrants.  Soft and non-tender without guarding, rebound tenderness, hernias or masses. Lymphatics: Non tender without lymphadenopathy.  Musculoskeletal: Full ROM, 5/5 strength, normal ambulation.  No clubbing or cyanosis. Skin: Appropriate  color for ethnicity. Warm without rashes, lesions, ecchymosis, ulcers.  Neuro: CN II-XII grossly normal. Normal muscle tone without cerebellar symptoms and intact sensation.   Psych: AO X 3,  appropriate mood and affect, insight and judgment.     [name], NP 1:57 PM [facility]

## 2022-12-23 NOTE — Patient Instructions (Addendum)
Costochondritis  Costochondritis is irritation and swelling (inflammation) of the tissue that connects the ribs to the breastbone (sternum). This tissue is called cartilage. This condition causes pain in the front of the chest. The pain often starts slowly. It may be in more than one rib. What are the causes? The cause of this condition is not always known. It can come from stress on the sternum. The cause of this stress could be: Chest injury. Exercise or activity. This may include lifting. Very bad coughing. What increases the risk? Being female. Being 17-60 years old. Starting a new exercise or work activity. Having low levels of vitamin D. Having a condition that makes you cough a lot. What are the signs or symptoms? Chest pain that: Starts slowly. It can be sharp or dull. Gets worse with deep breathing, coughing, or exercise. Gets better with rest. May be worse when you press on your ribs and breastbone. How is this treated? In most cases, this condition goes away on its own over time. You may need to take an NSAID, such as ibuprofen. This can help reduce pain. You may also need to: Rest and stay away from activities that make pain worse. Put heat or ice on the area that hurts. Do exercises to stretch your chest muscles. If these treatments do not help, your doctor may inject a medicine to numb the area. This can help relieve the pain. Follow these instructions at home: Managing pain, stiffness, and swelling     If told, put ice on the painful area. To do this: Put ice in a plastic bag. Place a towel between your skin and the bag. Leave the ice on for 20 minutes, 2-3 times a day. If told, put heat on the affected area. Do this as often as told by your doctor. Use the heat source that your doctor recommends, such as a moist heat pack or a heating pad. Place a towel between your skin and the heat source. Leave the heat on for 20-30 minutes. If your skin turns bright red,  take off the ice or heat right away to prevent skin damage. The risk of skin damage is higher if you cannot feel pain, heat, or cold. Activity Rest as told by your doctor. Do not do things that make your pain worse. This includes activities that use your chest, belly (abdomen), and side muscles. You may have to avoid lifting. Ask your doctor how much you can safely lift. Return to your normal activities when your doctor says that it is safe. General instructions Take over-the-counter and prescription medicines only as told by your doctor. Contact a doctor if: You have chills or a fever. Your pain does not go away or gets worse. You have a cough that does not go away. Get help right away if: You have a hard time breathing. You have very bad chest pain that does not get better with medicines, heat, or ice. These symptoms may be an emergency. Get help right away. Call 911. Do not wait to see if the symptoms will go away. Do not drive yourself to the hospital. This information is not intended to replace advice given to you by your health care provider. Make sure you discuss any questions you have with your health care provider. Document Revised: 12/05/2021 Document Reviewed: 12/05/2021 Elsevier Patient Education  2024 [employer]   Pleurisy  Pleurisy, or pleuritis, is irritation and inflammation of the outer lining of the lungs (pleura). The pleura has two layers  that cover both the outside of the lung and the inside of the chest wall. Most often, there is a small amount of fluid (pleural fluid) between the layers of the pleura. The fluid allows the lungs to move in and out smoothly when you breathe. Pleurisy can cause the layers of the pleura to be rough and dry and to rub together. This can make it hard to breathe or cough. Sometimes, pleurisy can be linked to pleural effusion. This is when fluid builds up between the layers. What are the causes? Common causes of this condition  include: A lung infection. This may be caused by bacteria or a virus. Pulmonary embolism. This is a blood clot that travels to the lungs. Pneumothorax. This is when air leaks into the pleural space. It can happen after an injury or trauma to the chest. Heart or chest surgery. A lung reaction to certain medicines or treatments. These include treatments to kill cancer cells, such as chemotherapy or radiation therapy to the chest. Conditions that can cause inflammation in the lungs. These include rheumatoid arthritis, lupus, sickle cell disease, inflammatory bowel disease, pancreatitis, lung cancer, and lung tumor. In some cases, the cause of this condition is not known. What are the signs or symptoms? The main symptom of this condition is chest pain. The pain may: Be on one side of your body. Be sharp and stabbing. Spread to your back or shoulder. It may get worse when you cough, take deep breaths, or make sudden movements. Other symptoms may include: Shortness of breath. Noisy breathing. This may include making high-pitched whistling sounds when you breathe out (wheezing). Cough. Fever. Coughing up blood (hemoptysis) or mucus from your lungs (sputum). Symptoms may get worse when you lie down or lie on one side. Your symptoms may be similar to those of a heart attack or inflammation of the heart (pericarditis). How is this diagnosed? This condition may be diagnosed based on your medical history and symptoms. Your health care provider will check if you have heart or lung disease. They may also do a physical exam. During the exam, they will listen to your breathing to check for a rough, rubbing sound (friction rub) when you breathe. You may also have tests, such as: Blood tests. These can be done to check for infections or diseases. They can also measure the amount of oxygen in your blood. An electrocardiogram (ECG). This looks at your heart rhythm. Imaging tests of your lungs. These may include  a chest X-ray, ultrasound, MRI, or CT scan. Thoracentesis. This is when a needle is used to remove pleural fluid for testing. How is this treated? Treatment for pleurisy depends on the cause. If it was caused by a virus, it may clear up on its own within 2 weeks. Treatment for pleurisy may include: NSAIDs, such as ibuprofen. These can help relieve pain and inflammation. Antibiotics. You may need these if your condition was caused by bacteria. Prescription pain medicine or cough medicine. Blood thinners (anticoagulants). These can treat blood clots if your condition was caused by pulmonary embolism. Removal of pleural fluid or air. This can be done using a chest tube to vacuum fluid or air from the pleural space. Follow these instructions at home: Medicines Take over-the-counter and prescription medicines only as told by your provider. If you were prescribed antibiotics, take them as told by your provider. Do not stop using the antibiotic even if you start to feel better. If you were prescribed medicines to remove extra fluid  from your lungs (diuretics), take them as told by your provider. If you are taking blood thinners: Talk with your provider before you take any medicines that contain aspirin or NSAIDs. These medicines increase your risk for dangerous bleeding. Take your medicine exactly as told, at the same time every day. Avoid activities that could cause injury or bruising. Follow instructions about how to prevent falls. Wear a medical alert bracelet or carry a card that lists what medicines you take. Ask your provider if the medicine prescribed to you requires you to avoid driving or using machinery. Activity Rest as told by your provider. Return to your normal activities as told by your provider. Ask your provider what activities are safe for you. General instructions Watch for changes in your condition. Take deep breaths often, even if it is painful. This can help prevent lung  infection (pneumonia) and collapse of lung tissue (atelectasis). You may be given a device called an incentive spirometer to help you breathe and exercise your lungs. Do not use any products that contain nicotine or tobacco. These products include cigarettes, chewing tobacco, and vaping devices, such as e-cigarettes. If you need help quitting, ask your provider. Contact a health care provider if: You have pain that gets worse or happens more often. Your pain does not get better with medicine. You have a fever or chills. Your cough or shortness of breath does not get better. You cough up pus-like (purulent) fluid. You cough up blood. Get help right away if: You have trouble breathing with activity, and it gets worse. You have shortness of breath or wheezing that gets worse. You have pain that spreads into your neck, arms, or jaw. You feel dizzy or faint. These symptoms may be an emergency. Get help right away. Call 911. Do not wait to see if the symptoms will go away. Do not drive yourself to the hospital. This information is not intended to replace advice given to you by your health care provider. Make sure you discuss any questions you have with your health care provider. Document Revised: 12/27/2021 Document Reviewed: 12/27/2021 Elsevier Patient Education  2024 [employer].

## 2023-01-13 ENCOUNTER — Ambulatory Visit: Payer: Medicare PPO | Admitting: Nurse Practitioner

## 2023-01-15 ENCOUNTER — Encounter: Payer: Self-pay | Admitting: Nurse Practitioner

## 2023-01-15 ENCOUNTER — Ambulatory Visit: Payer: Medicare PPO | Admitting: Nurse Practitioner

## 2023-01-15 VITALS — BP 130/74 | HR 73 | Temp 97.4°F | Ht 62.0 in | Wt 232.4 lb

## 2023-01-15 DIAGNOSIS — Z0001 Encounter for general adult medical examination with abnormal findings: Secondary | ICD-10-CM

## 2023-01-15 DIAGNOSIS — M3500 Sicca syndrome, unspecified: Secondary | ICD-10-CM

## 2023-01-15 DIAGNOSIS — F419 Anxiety disorder, unspecified: Secondary | ICD-10-CM

## 2023-01-15 DIAGNOSIS — Z79899 Other long term (current) drug therapy: Secondary | ICD-10-CM | POA: Diagnosis not present

## 2023-01-15 DIAGNOSIS — I10 Essential (primary) hypertension: Secondary | ICD-10-CM

## 2023-01-15 DIAGNOSIS — R6889 Other general symptoms and signs: Secondary | ICD-10-CM | POA: Diagnosis not present

## 2023-01-15 DIAGNOSIS — R41840 Attention and concentration deficit: Secondary | ICD-10-CM

## 2023-01-15 DIAGNOSIS — E559 Vitamin D deficiency, unspecified: Secondary | ICD-10-CM

## 2023-01-15 DIAGNOSIS — Z0000 Encounter for general adult medical examination without abnormal findings: Secondary | ICD-10-CM

## 2023-01-15 DIAGNOSIS — R7309 Other abnormal glucose: Secondary | ICD-10-CM | POA: Diagnosis not present

## 2023-01-15 DIAGNOSIS — E782 Mixed hyperlipidemia: Secondary | ICD-10-CM

## 2023-01-15 DIAGNOSIS — M810 Age-related osteoporosis without current pathological fracture: Secondary | ICD-10-CM

## 2023-01-15 DIAGNOSIS — E538 Deficiency of other specified B group vitamins: Secondary | ICD-10-CM

## 2023-01-15 DIAGNOSIS — R7303 Prediabetes: Secondary | ICD-10-CM

## 2023-01-15 DIAGNOSIS — M5441 Lumbago with sciatica, right side: Secondary | ICD-10-CM

## 2023-01-15 DIAGNOSIS — D509 Iron deficiency anemia, unspecified: Secondary | ICD-10-CM

## 2023-01-15 MED ORDER — PHENTERMINE HCL 37.5 MG PO TABS
ORAL_TABLET | ORAL | 1 refills | Status: DC
Start: 2023-01-15 — End: 2023-10-12

## 2023-01-15 MED ORDER — PREDNISONE 10 MG PO TABS: ORAL_TABLET | ORAL | 0 refills | Status: DC

## 2023-01-15 MED ORDER — ESCITALOPRAM OXALATE 20 MG PO TABS
ORAL_TABLET | ORAL | 3 refills | Status: DC
Start: 2023-01-15 — End: 2023-04-20

## 2023-01-15 NOTE — Progress Notes (Signed)
MEDICARE ANNUAL WELLNESS VISIT AND FOLLOW UP  Assessment:    Diagnoses and all orders for this visit:  Annual Medicare preventive visit Due annually  Health maintenance reviewed Healthily lifestyle goals set   Essential hypertension Discussed DASH (Dietary Approaches to Stop Hypertension) DASH diet is lower in sodium than a typical American diet. Cut back on foods that are high in saturated fat, cholesterol, and trans fats. Eat more whole-grain foods, fish, poultry, and nuts Remain active and exercise as tolerated daily.  Monitor BP at home-Call if greater than 130/80.  Check CMP/CBC  Age-related osteoporosis without current pathological fracture Pursue a combination of weight-bearing exercises and strength training. Advised on fall prevention measures including proper lighting in all rooms, removal of area rugs and floor clutter, use of walking devices as deemed appropriate, avoidance of uneven walking surfaces. Smoking cessation and moderate alcohol consumption if applicable Consume 800 to 1000 IU of vitamin D daily with a goal vitamin D serum value of 30 ng/mL or higher. Aim for 1000 to 1200 mg of elemental calcium daily through supplements and/or dietary sources.  Sjogren's syndrome, with unspecified organ involvement (HCC) Monitor; ophth and dental regularly   Morbid obesity (HCC)- BMI 40+ Discussed appropriate BMI Diet modification. Physical activity. Encouraged/praised to build confidence.  Abnormal glucose Education: Reviewed 'ABCs' of diabetes management  Discussed goals to be met and/or maintained include A1C (<7) Blood pressure (<130/80) Cholesterol (LDL <70) Continue Eye Exam yearly  Continue Dental Exam Q6 mo Discussed dietary recommendations Discussed Physical Activity recommendations Foot exam UTD Monitor A1C  Hyperlipidemia, mixed Discussed lifestyle modifications. Recommended diet heavy in fruits and veggies, omega 3's. Decrease consumption of  animal meats, cheeses, and dairy products. Remain active and exercise as tolerated. Monitor lipids/TSH  Medication management All medications discussed and reviewed in full. All questions and concerns regarding medications addressed.    Vitamin D deficiency Continue supplement Monitor levels  B12 deficiency Continue supplement Monitor levels  Iron deficiency anemia, unspecified iron deficiency anemia type Dr. Candise Che following  Continue to monitor Monitor CBC  Anxiety Well managed by current regimen; continue medications Stress management techniques discussed, increase water, good sleep hygiene discussed, increase exercise, and increase veggies.   Attention deficit Managed Continue to monitor   Low back pain with sciatica flare Start Prednisone taper Discussed stretching exercises Rest when flared Continue to monitor  Orders Placed This Encounter  Procedures   DG Bone Density    Standing Status:   Future    Standing Expiration Date:   01/15/2024    Order Specific Question:   Reason for Exam (SYMPTOM  OR DIAGNOSIS REQUIRED)    Answer:   Osteopenia    Order Specific Question:   Preferred imaging location?    Answer:   [facility]   CBC with Differential/Platelet   COMPLETE METABOLIC PANEL WITH GFR   Lipid panel   Hemoglobin A1c   Meds ordered this encounter  Medications   predniSONE (DELTASONE) 10 MG tablet    Sig: 1 tab 3 x day for 2 days, then 1 tab 2 x day for 2 days, then 1 tab 1 x day for 3 days    Dispense:  13 tablet    Refill:  0    Order Specific Question:   Supervising Provider    Answer:   Lucky Cowboy [[id number]]   escitalopram (LEXAPRO) 20 MG tablet    Sig: Take  1 tablet  Daily  for Mood    Dispense:  90 tablet  Refill:  3    Order Specific Question:   Supervising Provider    Answer:   Lucky Cowboy [phone]   phentermine (ADIPEX-P) 37.5 MG tablet    Sig: TAKE 1 TABLET EVERY MORNING FOR DIETING AND WEIGHT LOSS    Dispense:  90 tablet     Refill:  1    Order Specific Question:   Supervising Provider    Answer:   Lucky Cowboy [phone]   Notify office for further evaluation and treatment, questions or concerns if any reported s/s fail to improve.   The patient was advised to call back or seek an in-person evaluation if any symptoms worsen or if the condition fails to improve as anticipated.   Further disposition pending results of labs. Discussed med's effects and SE's.    I discussed the assessment and treatment plan with the patient. The patient was provided an opportunity to ask questions and all were answered. The patient agreed with the plan and demonstrated an understanding of the instructions.  Discussed med's effects and SE's. Screening labs and tests as requested with regular follow-up as recommended.  I provided 35 minutes of face-to-face time during this encounter including counseling, chart review, and critical decision making was preformed.  Future Appointments  Date Time Provider Department Center  04/20/2023 10:30 AM Lucky Cowboy, MD [facility] None  07/21/2023 10:30 AM [name], NP [facility] None  10/20/2023 11:00 AM Lucky Cowboy, MD [facility] None  01/18/2024 11:30 AM [name], NP [facility] None     Plan:   During the course of the visit the patient was educated and counseled about appropriate screening and preventive services including:   Pneumococcal vaccine  Prevnar 13 Influenza vaccine Td vaccine Screening electrocardiogram Bone densitometry screening Colorectal cancer screening Diabetes screening Glaucoma screening Nutrition counseling  Advanced directives: requested   Subjective:  Whitney Herrera is a 67 y.o. female who presents for Medicare Annual Wellness Visit and 3 month follow up. She has Essential hypertension; Hyperlipidemia, mixed; Morbid obesity (HCC) - BMI 40+; B12 deficiency; Vitamin D deficiency; Abnormal glucose; Screening for heart disease; Sjogren's  syndrome (HCC); Osteoporosis; Symptomatic anemia; Iron deficiency anemia; Attention deficit; and FH: hypertension on their problem list.  Overall she reports feeling well, however she does note increase in low back pain, particularly right side that radiates down right leg.  States that she had a beach trip last week that had several steep stairs that she had to climb up and down during vacation and this triggered her symptoms.  She has been taking Tylenol and Advil without relief. Denies any recent fall, trauma, injury.    She was prescribed fosamax weekly for osteoporosis in 06/2018 via GYN Dr. Arelia Sneddon.  Has not had f/u DEXA scan.  No longer taking.    She is currently taking lexapro 20 mg daily that was started for work related stress/anxiety several years ago, doing much better since retiring in July 2019, however continues due to pandemic. Denies depression, lack of enjoyment, but reports noting persistent poor focus, struggles to start and complete tasks. Has tried wellbutrin but didn't tolerate well. She does take Phentermine for weight/obesity which she reports is effective at managing her symptoms.   Left knee pain, had steroid injection by [facility] ortho, planning to follow up soon, still bothering her fairly consistently.   BMI is Body mass index is 42.51 kg/m., she is working on diet and exercise. Wt Readings from Last 3 Encounters:  01/15/23 232 lb 6.4 oz (105.4 kg)  12/23/22 235 lb 12.8 oz (107 kg)  11/18/22 235 lb 3.2 oz (106.7 kg)    Her blood pressure has been controlled at home, today their BP is BP: 130/74 She does not workout. She denies chest pain, shortness of breath, dizziness.   She is on cholesterol medication (rosuvastatin 20 mg daily, ? Was off last visit) and denies myalgias. Her cholesterol is not at goal. The cholesterol last visit was:   Lab Results  Component Value Date   CHOL 164 10/13/2022   HDL 49 (L) 10/13/2022   LDLCALC 88 10/13/2022   TRIG 178 (H)  10/13/2022   CHOLHDL 3.3 10/13/2022   She has not been working on diet and exercise for glucose management, and denies increased appetite, nausea, paresthesia of the feet, polydipsia, and polyuria. Last A1C in the office was:  Lab Results  Component Value Date   HGBA1C 5.9 (H) 10/13/2022   Last GFR: Lab Results  Component Value Date   GFRNONAA >60 02/15/2021   Patient is on Vitamin D supplement.   Lab Results  Component Value Date   VD25OH 53 10/13/2022     She has anemia; iron deficiency, had negative hemoccult x 3 in April 2021, was on iron supplement but stopped due to GI SE, Had negatvie EGD and colonosocpy with Dr. Kinnie Scales in Nov 2019 for anemia workup. Was referred to Dr. Candise Che and was underwent IV injectafer weekly x 2, recommended iron polysaccharide and B complex, had follow up last week but pending recommenndations    Latest Ref Rng & Units 09/22/2022   11:41 AM 08/19/2022   12:03 PM 07/14/2022   12:00 AM  CBC  WBC 3.8 - 10.8 Thousand/uL 7.2  7.7  8.3   Hemoglobin 11.7 - 15.5 g/dL 9.7  16.1  9.5   Hematocrit 35.0 - 45.0 % 32.9  32.9  33.1   Platelets 140 - 400 Thousand/uL 276  247.0  269    Taking ferex 150 mg daily Lab Results  Component Value Date   IRON 91 09/22/2022   TIBC 503 (H) 09/22/2022   FERRITIN 4 (L) 09/22/2022   Taking 2500 mcg SL daily  Lab Results  Component Value Date   VITAMINB12 1,532 (H) 07/07/2022      Medication Review: Current Outpatient Medications on File Prior to Visit  Medication Sig Dispense Refill   albuterol (VENTOLIN HFA) 108 (90 Base) MCG/ACT inhaler Inhale 2 puffs into the lungs every 6 (six) hours as needed for wheezing or shortness of breath. 8 g 2   atenolol (TENORMIN) 100 MG tablet TAKE 1 TABLET BY MOUTH ONCE DAILY FOR BLOOD PRESSURE (Patient taking differently: 25 mg. TAKE 1 TABLET BY MOUTH ONCE DAILY FOR BLOOD PRESSURE) 90 tablet 3   Cholecalciferol (VITAMIN D PO) Take 5,000 Units by mouth daily.      Cyanocobalamin (B-12  PO) Take by mouth.     escitalopram (LEXAPRO) 20 MG tablet TAKE 1 TABLET DAILY FOR MOOD 90 tablet 3   fluticasone-salmeterol (ADVAIR) 250-50 MCG/ACT AEPB Use 1 Inhalation 2 x /day ( every 12 hours) for Allergic Asthma 180 each 3   Multiple Vitamins-Minerals (ONE DAILY MULTIVITAMIN WOMEN PO) Take 1 tablet by mouth daily. With iron and B vitamins     olmesartan (BENICAR) 20 MG tablet Take  1/2 to 1 tablet  at night for BP 90 tablet 3   phentermine (ADIPEX-P) 37.5 MG tablet TAKE 1 TABLET EVERY MORNING FOR DIETING AND WEIGHT LOSS 90 tablet 1   rosuvastatin (CRESTOR)  20 MG tablet Take  1 tablet  Daily for Cholesterol                                                        /                                            TAKE 1                                        BY                                              MOUTH 90 tablet 3   traZODone (DESYREL) 150 MG tablet Take 1/2 to 1 tablet at Bedtime as needed  for Sleep 90 tablet 3   Fluticasone Propionate (FLONASE NA) Place into the nose. (Patient not taking: Reported on 01/15/2023)     nystatin (MYCOSTATIN) 100000 UNIT/ML suspension Swish 5 mL in the mouth and retain for as long as possible (several minutes) before swallowing. (Patient not taking: Reported on 01/15/2023) 60 mL 0   predniSONE (DELTASONE) 10 MG tablet 1 tab 3 x day for 2 days, then 1 tab 2 x day for 2 days, then 1 tab 1 x day for 3 days 13 tablet 0   Current Facility-Administered Medications on File Prior to Visit  Medication Dose Route Frequency Provider Last Rate Last Admin   ipratropium-albuterol (DUONEB) 0.5-2.5 (3) MG/3ML nebulizer solution 3 mL  3 mL Nebulization Once [name], NP        Allergies  Allergen Reactions   Lipitor [Atorvastatin]    Wellbutrin [Bupropion]     dysphoria    Current Problems (verified) Patient Active Problem List   Diagnosis Date Noted   FH: hypertension 10/03/2021   Attention deficit 02/22/2021   Iron deficiency anemia 11/09/2019   Symptomatic  anemia 11/04/2019   Osteoporosis 10/15/2018   Sjogren's syndrome (HCC) 05/08/2014   Screening for heart disease 07/19/2013   Abnormal glucose    Essential hypertension    Hyperlipidemia, mixed    Morbid obesity (HCC) - BMI 40+    B12 deficiency    Vitamin D deficiency     Screening Tests Immunization History  Administered Date(s) Administered   Influenza Inj Mdck Quad Pf 03/09/2019   Influenza, High Dose Seasonal PF 02/21/2021, 03/12/2022   Influenza-Unspecified 04/28/2015, 02/26/2016, 03/09/2019   PFIZER(Purple Top)SARS-COV-2 Vaccination 08/10/2019, 09/07/2019, 12/22/2020   PNEUMOCOCCAL CONJUGATE-20 02/22/2021   PPD Test 01/31/2014, 02/12/2015, 03/10/2016, 06/18/2017, 07/28/2019, 10/01/2020   Pneumococcal-Unspecified 06/17/2008   Td 04/18/2007   Tdap 06/18/2017   Zoster Recombinant(Shingrix) 07/30/2020, 10/19/2020    Preventative care: Last colonoscopy: 09/2022 Dr. Kinnie Scales, 10 year recall  Last mammogram: 09/2021 Negative Re-screen 1 year Last pap smear/pelvic exam: last 2020, never abnormal, by GYN    DEXA: 04/2022  Prior vaccinations: TD or Tdap: 2019  Influenza: Due 03/2022 Pneumo 20: 2023  Shingrix: 2/2, 2022 Covid 19: 2/2, booster 11/2020  Names of  Other Physician/Practitioners you currently use: 1. [facility] here for primary care 2. [facility], eye doctor, last visit 2024, glasses, goes annually 3. Dr. Mayford Knife, dentist, last visit 2024, q68m  Patient Care Team: Lucky Cowboy, MD as PCP - General (Internal Medicine) Richardean Chimera, MD as Consulting Physician (Obstetrics and Gynecology)  SURGICAL HISTORY She  has a past surgical history that includes Breast biopsy (Right, 06/02/1973); Appendectomy; Inguinal hernia repair (Bilateral, age 30); Colonoscopy; and Upper gastrointestinal endoscopy. FAMILY HISTORY Her family history includes Alzheimer's disease in her father and mother; Hypertension in her father and  mother. SOCIAL HISTORY She  reports that she has never smoked. She has never used smokeless tobacco. She reports current alcohol use of about 2.0 standard drinks of alcohol per week. She reports that she does not use drugs.   MEDICARE WELLNESS OBJECTIVES: Physical activity: Current Exercise Habits: Home exercise routine Cardiac risk factors:   Depression/mood screen:      01/15/2023   11:35 AM  Depression screen PHQ 2/9  Decreased Interest 0  Down, Depressed, Hopeless 0  PHQ - 2 Score 0  Altered sleeping 0  Tired, decreased energy 0  Change in appetite 0  Feeling bad or failure about yourself  0  Trouble concentrating 0  Moving slowly or fidgety/restless 0  Suicidal thoughts 0  PHQ-9 Score 0    ADLs:     01/15/2023   11:34 AM 10/13/2022   10:21 PM  In your present state of health, do you have any difficulty performing the following activities:  Hearing? 0 0  Vision? 0 0  Difficulty concentrating or making decisions? 0 0  Walking or climbing stairs? 0 0  Dressing or bathing? 0 0  Doing errands, shopping? 0 0     Cognitive Testing  Alert? Yes  Normal Appearance?Yes  Oriented to person? Yes  Place? Yes   Time? Yes  Recall of three objects?  Yes  Can perform simple calculations? Yes  Displays appropriate judgment?Yes  Can read the correct time from a watch face?Yes  EOL planning:    Review of Systems  Constitutional:  Positive for malaise/fatigue. Negative for weight loss.  HENT:  Negative for hearing loss and tinnitus.   Eyes:  Negative for blurred vision and double vision.  Respiratory:  Negative for cough, sputum production, shortness of breath and wheezing.   Cardiovascular:  Negative for chest pain, palpitations, orthopnea, claudication, leg swelling and PND.  Gastrointestinal:  Negative for abdominal pain, blood in stool, constipation, diarrhea, heartburn, melena, nausea and vomiting.  Genitourinary: Negative.   Musculoskeletal:  Positive for joint pain (L  knee, ortho follows). Negative for falls and myalgias.  Skin:  Negative for rash.  Neurological:  Negative for dizziness, tingling, sensory change, weakness and headaches.  Endo/Heme/Allergies:  Negative for polydipsia.  Psychiatric/Behavioral: Negative.  Negative for depression, memory loss, substance abuse and suicidal ideas. The patient is not nervous/anxious and does not have insomnia.        Poor focus and motivation   All other systems reviewed and are negative.    Objective:     Today's Vitals   01/15/23 1102  BP: 130/74  Pulse: 73  Temp: (!) 97.4 F (36.3 C)  SpO2: 98%  Weight: 232 lb 6.4 oz (105.4 kg)  Height: 5\' 2"  (1.575 m)    Body mass index is 42.51 kg/m.  General appearance: alert, no distress, WD/WN, female HEENT: normocephalic, sclerae anicteric, TMs pearly, nares patent, no discharge or erythema,  pharynx normal Oral cavity: MMM, no lesions Neck: supple, no lymphadenopathy, no thyromegaly, no masses Heart: RRR, normal S1, S2, no murmurs Lungs: CTA bilaterally, no wheezes, rhonchi, or rales Abdomen: +bs, soft, morbidly obese abdomen, non tender, non distended, no masses, no hepatomegaly, no splenomegaly Musculoskeletal: nontender, no swelling, no obvious deformity Extremities: no edema, no cyanosis, no clubbing Pulses: 2+ symmetric, upper and lower extremities, normal cap refill Neurological: alert, oriented x 3, CN2-12 intact, strength normal upper extremities and lower extremities, sensation normal throughout, DTRs 2+ throughout, no cerebellar signs, gait normal Psychiatric: normal affect, behavior normal, pleasant   Medicare Attestation I have personally reviewed: The patient's medical and social history Their use of alcohol, tobacco or illicit drugs Their current medications and supplements The patient's functional ability including ADLs,fall risks, home safety risks, cognitive, and hearing and visual impairment Diet and physical activities Evidence  for depression or mood disorders  The patient's weight, height, BMI, and visual acuity have been recorded in the chart.  I have made referrals, counseling, and provided education to the patient based on review of the above and I have provided the patient with a written personalized care plan for preventive services.     [name], NP   01/15/2023

## 2023-01-15 NOTE — Patient Instructions (Signed)
Sciatica  Sciatica is pain, weakness, tingling, or loss of feeling (numbness) along the sciatic nerve. The sciatic nerve starts in the lower back and goes down the back of each leg. Sciatica usually affects one side of the body. Sciatica usually goes away on its own or with treatment. Sometimes, sciatica may come back. What are the causes? This condition happens when the sciatic nerve is pinched or has pressure put on it. This may be caused by: A disk in between the bones of the spine bulging out too far (herniated disk). Changes in the spinal disks due to aging. A condition that affects a muscle in the butt. Extra bone growth near the sciatic nerve. A break (fracture) of the area between your hip bones (pelvis). Pregnancy. Tumor. This is rare. What increases the risk? You are more likely to develop this condition if you: Play sports that put pressure or stress on the spine. Have poor strength and ease of movement (flexibility). Have had a back injury or back surgery. Sit for long periods of time. Do activities that involve bending or lifting over and over again. Are very overweight (obese). What are the signs or symptoms? Symptoms can vary from mild to very bad. They may include: Any of these problems in the lower back, leg, hip, or butt: Mild tingling, loss of feeling, or dull aches. A burning feeling. Sharp pains. Loss of feeling in the back of the calf or the sole of the foot. Leg weakness. Very bad back pain that makes it hard to move. These symptoms may get worse when you cough, sneeze, or laugh. They may also get worse when you sit or stand for long periods of time. How is this treated? This condition often gets better without any treatment. However, treatment may include: Changing or cutting back on physical activity when you have pain. Exercising, including strengthening and stretching. Putting ice or heat on the affected area. Shots of medicines to relieve pain and  swelling or to relax your muscles. Surgery. Follow these instructions at home: Medicines Take over-the-counter and prescription medicines only as told by your doctor. Ask your doctor if you should avoid driving or using machines while you are taking your medicine. Managing pain     If told, put ice on the affected area. To do this: Put ice in a plastic bag. Place a towel between your skin and the bag. Leave the ice on for 20 minutes, 2-3 times a day. If your skin turns bright red, take off the ice right away to prevent skin damage. The risk of skin damage is higher if you cannot feel pain, heat, or cold. If told, put heat on the affected area. Do this as often as told by your doctor. Use the heat source that your doctor tells you to use, such as a moist heat pack or a heating pad. Place a towel between your skin and the heat source. Leave the heat on for 20-30 minutes. If your skin turns bright red, take off the heat right away to prevent burns. The risk of burns is higher if you cannot feel pain, heat, or cold. Activity  Return to your normal activities when your doctor says that it is safe. Avoid activities that make your symptoms worse. Take short rests during the day. When you rest for a long time, do some physical activity or stretching between periods of rest. Avoid sitting for a long time without moving. Get up and move around at least one time each   hour. Do exercises and stretches as told by your doctor. Do not lift anything that is heavier than 10 lb (4.5 kg). Avoid lifting heavy things even when you do not have symptoms. Avoid lifting heavy things over and over. When you lift objects, always lift in a way that is safe for your body. To do this, you should: Bend your knees. Keep the object close to your body. Avoid twisting. General instructions Stay at a healthy weight. Wear comfortable shoes that support your feet. Avoid wearing high heels. Avoid sleeping on a mattress  that is too soft or too hard. You might have less pain if you sleep on a mattress that is firm enough to support your back. Contact a doctor if: Your pain is not controlled by medicine. Your pain does not get better. Your pain gets worse. Your pain lasts longer than 4 weeks. You lose weight without trying. Get help right away if: You cannot control when you pee (urinate) or poop (have a bowel movement). You have weakness in any of these areas and it gets worse: Lower back. The area between your hip bones. Butt. Legs. You have redness or swelling of your back. You have a burning feeling when you pee. Summary Sciatica is pain, weakness, tingling, or loss of feeling (numbness) along the sciatic nerve. This may include the lower back, legs, hips, and butt. This condition happens when the sciatic nerve is pinched or has pressure put on it. Treatment often includes rest, exercise, medicines, and putting ice or heat on the affected area. This information is not intended to replace advice given to you by your health care provider. Make sure you discuss any questions you have with your health care provider. Document Revised: 08/26/2021 Document Reviewed: 08/26/2021 Elsevier Patient Education  2024 [employer]  

## 2023-01-16 LAB — CBC WITH DIFFERENTIAL/PLATELET
Absolute Monocytes: 484 {cells}/uL (ref 200–950)
Basophils Absolute: 50 {cells}/uL (ref 0–200)
Basophils Relative: 0.8 %
Eosinophils Absolute: 223 {cells}/uL (ref 15–500)
Eosinophils Relative: 3.6 %
HCT: 38.9 % (ref 35.0–45.0)
Hemoglobin: 11.8 g/dL (ref 11.7–15.5)
Lymphs Abs: 1748 {cells}/uL (ref 850–3900)
MCH: 24.9 pg — ABNORMAL LOW (ref 27.0–33.0)
MCHC: 30.3 g/dL — ABNORMAL LOW (ref 32.0–36.0)
MCV: 82.2 fL (ref 80.0–100.0)
MPV: 10.6 fL (ref 7.5–12.5)
Monocytes Relative: 7.8 %
Neutro Abs: 3695 {cells}/uL (ref 1500–7800)
Neutrophils Relative %: 59.6 %
Platelets: 217 10*3/uL (ref 140–400)
RBC: 4.73 10*6/uL (ref 3.80–5.10)
RDW: 17.1 % — ABNORMAL HIGH (ref 11.0–15.0)
Total Lymphocyte: 28.2 %
WBC: 6.2 10*3/uL (ref 3.8–10.8)

## 2023-01-16 LAB — COMPLETE METABOLIC PANEL WITH GFR
AG Ratio: 1.7 (calc) (ref 1.0–2.5)
ALT: 14 U/L (ref 6–29)
AST: 18 U/L (ref 10–35)
Albumin: 4.3 g/dL (ref 3.6–5.1)
Alkaline phosphatase (APISO): 65 U/L (ref 37–153)
BUN: 17 mg/dL (ref 7–25)
CO2: 30 mmol/L (ref 20–32)
Calcium: 10.1 mg/dL (ref 8.6–10.4)
Chloride: 106 mmol/L (ref 98–110)
Creat: 0.72 mg/dL (ref 0.50–1.05)
Globulin: 2.5 g/dL (ref 1.9–3.7)
Glucose, Bld: 100 mg/dL — ABNORMAL HIGH (ref 65–99)
Potassium: 5.1 mmol/L (ref 3.5–5.3)
Sodium: 143 mmol/L (ref 135–146)
Total Bilirubin: 0.3 mg/dL (ref 0.2–1.2)
Total Protein: 6.8 g/dL (ref 6.1–8.1)
eGFR: 92 mL/min/{1.73_m2} (ref >=60)

## 2023-01-16 LAB — LIPID PANEL
Cholesterol: 159 mg/dL (ref <200)
HDL: 54 mg/dL (ref >=50)
LDL Cholesterol (Calc): 82 mg/dL
Non-HDL Cholesterol (Calc): 105 mg/dL (ref <130)
Total CHOL/HDL Ratio: 2.9 (calc) (ref <5.0)
Triglycerides: 132 mg/dL (ref <150)

## 2023-01-16 LAB — HEMOGLOBIN A1C
Hgb A1c MFr Bld: 5.8 %{Hb} — ABNORMAL HIGH (ref <5.7)
Mean Plasma Glucose: 120 mg/dL
eAG (mmol/L): 6.6 mmol/L

## 2023-03-16 ENCOUNTER — Ambulatory Visit: Payer: Medicare PPO | Admitting: Nurse Practitioner

## 2023-03-20 ENCOUNTER — Other Ambulatory Visit: Payer: Self-pay | Admitting: Nurse Practitioner

## 2023-03-20 DIAGNOSIS — J40 Bronchitis, not specified as acute or chronic: Secondary | ICD-10-CM

## 2023-03-31 ENCOUNTER — Encounter: Payer: Self-pay | Admitting: Hematology

## 2023-04-06 ENCOUNTER — Encounter: Payer: Self-pay | Admitting: Hematology

## 2023-04-07 ENCOUNTER — Ambulatory Visit (INDEPENDENT_AMBULATORY_CARE_PROVIDER_SITE_OTHER): Payer: Medicare PPO | Admitting: Podiatry

## 2023-04-07 ENCOUNTER — Ambulatory Visit (INDEPENDENT_AMBULATORY_CARE_PROVIDER_SITE_OTHER): Payer: Medicare PPO

## 2023-04-07 ENCOUNTER — Encounter: Payer: Self-pay | Admitting: Podiatry

## 2023-04-07 DIAGNOSIS — M79671 Pain in right foot: Secondary | ICD-10-CM | POA: Diagnosis not present

## 2023-04-07 DIAGNOSIS — S90121A Contusion of right lesser toe(s) without damage to nail, initial encounter: Secondary | ICD-10-CM

## 2023-04-07 NOTE — Progress Notes (Signed)
  Subjective:  Patient ID: Whitney Herrera, female    DOB: 03/05/1956,   MRN: [id number]  No chief complaint on file.   67 y.o. female presents for concern of right foot injury. Relates a couple months ago she jammed the fourth toe in a step. Relates it has been painful since and was concerned it hadn't healed.   . Denies any other pedal complaints. Denies n/v/f/c.   Past Medical History:  Diagnosis Date   B12 deficiency    Prediabetes    Vitamin D deficiency     Objective:  Physical Exam: Vascular: DP/PT pulses 2/4 bilateral. CFT <3 seconds. Normal hair growth on digits. No edema.  Skin. No lacerations or abrasions bilateral feet.  Musculoskeletal: MMT 5/5 bilateral lower extremities in DF, PF, Inversion and Eversion. Deceased ROM in DF of ankle joint. Tender to the fourth right digit at DIPJ. Swelling noted in the area. Pain with Rom of the digit distally at the DIPJ.  Neurological: Sensation intact to light touch.   Assessment:   1. Contusion of lesser toe of right foot without damage to nail, initial encounter      Plan:  Patient was evaluated and treated and all questions answered. -Xrays reviewed. No acute fractures or dislocations.  -Discussed treatement options for toe fracture; risks, alternatives, and benefits explained. -Discussed taping of toe.  -Continue supportive shoes -Recommend protection, rest, ice, elevation daily until symptoms improve -pain med/antinflammatories as needed -Patient to return to office as needed   Louann Sjogren, DPM

## 2023-04-19 ENCOUNTER — Encounter: Payer: Self-pay | Admitting: Internal Medicine

## 2023-04-19 NOTE — Patient Instructions (Signed)

## 2023-04-19 NOTE — Progress Notes (Unsigned)
[facility]  Lucky Cowboy, M.D.         Rance Muir, D.NP [name], D.NP  [facility] [address]  [place], [place]. [address] Telephone [phone] Telefax [phone]   Future Appointments  Date Time Provider Department  04/20/2023                    6 mo ov 10:30 AM Lucky Cowboy, MD [facility]  07/21/2023                       wellness 10:30 AM [name], NP [facility]  10/20/2023                      cpe 11:00 AM Lucky Cowboy, MD [facility]  01/18/2024 11:30 AM [name], NP [facility]    History of Present Illness:       This very nice 67 y.o. MWF presents for 6  month follow up with HTN, HLD, Pre-Diabetes and Vitamin D Deficiency. Patient has hx/o Osteoporosis followed by her GYN office.  She's followed by Dr Estrellita Ludwig for iron def anemia .  Patient has Morbid Obesity (BMI 40+) and consequent HTN, HLD & PreDiabetes .   Patient has been dx'd with Sjogren's Syndrome  for oral xerostomia and had seen Rheumatologist  - Dr Deanne Coffer in the past.         Patient is treated for HTN  (2005) & BP has been controlled at home. Today's  BP is at goal -  114/70 . Patient has had no complaints of any cardiac type chest pain, palpitations, dyspnea Pollyann Kennedy /PND, dizziness, claudication or dependent edema.        Hyperlipidemia is controlled with diet & rosuvastatin. Patient denies myalgias or other med SE's. Last Lipids were at goal except elevated Trig's :  Lab Results  Component Value Date   CHOL 172 03/12/2022   HDL 49 (L) 03/12/2022   LDLCALC 92 03/12/2022   TRIG 222 (H) 03/12/2022   CHOLHDL 3.5 03/12/2022     Also, the patient has history Morbid Obesity (BMI 41+  in Nov 2023) and consequent  hx/o prediabetes (A1c 5.9% /2011 & 5.8% /2016) of PreDiabetes and has had no symptoms of reactive hypoglycemia, diabetic polys, paresthesias or visual blurring.  Last A1c was at goal :  Lab Results  Component Value Date   HGBA1C 5.6 10/03/2021   Wt Readings from Last 3 Encounters:  04/20/23 230 lb 3.2 oz (104.4 kg)  01/15/23 232 lb 6.4 oz (105.4 kg)  12/23/22 235 lb 12.8 oz (107 kg)                                                        Further, the patient also has history of Vitamin D Deficiency ("20"/2008  & "34"/2013)  and supplements vitamin D without any suspected side-effects. Last vitamin D was still low  (goal 70-100) :  Lab Results  Component Value Date   VD25OH 48 10/03/2021       Current Outpatient Medications  Medication Instructions   atenolol 100 MG tablet TAKE 1 TABLET DAILY    VITAMIN D  5,000 Units Daily   Vitamin B-12     LEXAPRO 20 MG tablet Take  1 tablet  Daily  for Mood   FLONASE Place into the nose.   ADVAIR 250-50  Use 1 Inhalation 2 x /day ( every 12 hours)    Multiple Vitamins-Minerals  1 tablet  Daily   nystatin  100,000 UNIT/ML susp Swish 5 mL in the mouth before swallowing.   olmesartan 20 MG tablet Take  1/2 to 1 tablet  at night   phentermine  37.5 MG tablet TAKE 1 TABLET EVERY MORNING    rosuvastatin  20 MG tablet Take  1 tablet  Daily   traZODone  150 MG tablet Take 1/2 to 1 tablet at Bedtime as needed    VENTOLIN HFA  inhaler 2 Inhalations Every 6 hours PRN    Allergies  Allergen Reactions   Lipitor [Atorvastatin]    Wellbutrin [Bupropion] dysphoria     PMHx:   Past Medical History:  Diagnosis Date   B12 deficiency    Hyperlipidemia    Hypertension    Obesity    Prediabetes    Vitamin D deficiency     Immunization History  Administered Date(s) Administered   Influenza Inj Mdck Quad  03/09/2019   Influenza, High Dose  02/21/2021   Influenza 04/28/2015, 02/26/2016, 03/09/2019   PFIZER-SARS-COV-2 Vacc 08/10/2019, 09/07/2019,  12/22/2020   PNEUMOCOCCAL -20 02/22/2021   PPD Test 06/18/2017, 07/28/2019, 10/01/2020   Pneumococcal - 23 06/17/2008   Td 04/18/2007   Tdap 06/18/2017   Zoster Recombinat (Shingrix) 07/30/2020, 10/19/2020     Past Surgical History:  Procedure Laterality Date   APPENDECTOMY     BREAST BIOPSY Right 1975   INGUINAL HERNIA REPAIR Bilateral age 83    FHx:    Reviewed / unchanged   SHx:    Reviewed / unchanged    Systems Review:  Constitutional: Denies fever, chills, wt changes, headaches, insomnia, fatigue, night sweats, change in appetite. Eyes: Denies redness, blurred vision, diplopia, discharge, itchy, watery eyes.  ENT: Denies discharge, congestion, post nasal drip, epistaxis, sore throat, earache, hearing loss, dental pain, tinnitus, vertigo, sinus pain, snoring.  CV: Denies chest pain, palpitations, irregular heartbeat, syncope, dyspnea, diaphoresis, orthopnea, PND, claudication or edema. Respiratory: denies cough, dyspnea, DOE, pleurisy, hoarseness, laryngitis, wheezing.  Gastrointestinal: Denies dysphagia, odynophagia, heartburn, reflux, water brash, abdominal pain or cramps, nausea, vomiting, bloating, diarrhea, constipation, hematemesis, melena, hematochezia  or hemorrhoids. Genitourinary: Denies dysuria, frequency, urgency, nocturia, hesitancy, discharge, hematuria or flank pain. Musculoskeletal: Denies arthralgias, myalgias, stiffness, jt. swelling, pain, limping or strain/sprain.  Skin: Denies pruritus, rash, hives, warts, acne, eczema or change in skin lesion(s). Neuro: No weakness, tremor, incoordination, spasms, paresthesia or pain. Psychiatric: Denies confusion, memory loss or sensory loss. Endo: Denies change in weight, skin or hair change.  Heme/Lymph: No excessive bleeding, bruising or enlarged lymph nodes.  Physical Exam  BP 114/70   Pulse 60   Temp 98 F (36.7 C)   Resp 16   Ht 5\' 2"  (1.575 m)   Wt 230 lb 3.2 oz (104.4 kg)   SpO2 96%   BMI 42.10 kg/m    Appears  over  nourished, well groomed  and in no distress.  Eyes: PERRLA, EOMs, conjunctiva no swelling or erythema. Sinuses: No frontal/maxillary tenderness ENT/Mouth: EAC's clear, TM's nl w/o erythema, bulging. Nares clear w/o erythema, swelling, exudates. Oropharynx clear without erythema or exudates. Oral hygiene is good. Tongue normal, non obstructing. Hearing intact.  Neck: Supple. Thyroid not palpable. Car 2+/2+ without bruits, nodes or JVD. Chest: Respirations nl with BS clear & equal w/o  rales, rhonchi, wheezing or stridor.  Cor: Heart sounds normal w/ regular rate and rhythm without sig. murmurs, gallops, clicks or rubs. Peripheral pulses normal and equal  without edema.  Abdomen: Soft & bowel sounds normal. Non-tender w/o guarding, rebound, hernias, masses or organomegaly.  Lymphatics: Unremarkable.  Musculoskeletal: Full ROM all peripheral extremities, joint stability, 5/5 strength and normal gait.  Skin: Warm, dry without exposed rashes, lesions or ecchymosis apparent.  Neuro: Cranial nerves intact, reflexes equal bilaterally. Sensory-motor testing grossly intact. Tendon reflexes grossly intact.  Pysch: Alert & oriented x 3.  Insight and judgement nl & appropriate. No ideations.  Assessment and Plan:   1. Morbid obesity (HCC) - BMI 40+  - TSH   2. Essential hypertension  -  monitor blood pressure at home.  - Continue DASH diet.  Reminder to go to the ER if any CP,                  SOB, nausea, dizziness, severe HA, changes vision/speech.   - CBC with Differential/Platelet - COMPLETE METABOLIC PANEL WITH GFR - Magnesium - TSH   3. Hyperlipidemia, mixed   - Continue diet/meds, exercise,& lifestyle modifications.  - Continue monitor periodic cholesterol/liver & renal functions    - Lipid panel - TSH   4. Abnormal glucose   - Continue diet, exercise  - Lifestyle modifications.  - Monitor appropriate labs   - Hemoglobin A1c - Insulin, random   5.  Vitamin D deficiency  - Continue supplementation   - VITAMIN D 25 Hydroxy    6. Medication management  - CBC with Differential/Platelet - COMPLETE METABOLIC PANEL WITH GFR - Magnesium - Lipid panel - TSH - Hemoglobin A1c - Insulin, random - VITAMIN D 25 Hydroxy          Discussed  regular exercise, BP monitoring, weight control to achieve/maintain BMI less than 25 and discussed med and SE's. Recommended labs to assess and monitor clinical status with further disposition pending results of labs.  I discussed the assessment and treatment plan with the patient. The patient was provided an opportunity to ask questions and all were answered. The patient agreed with the plan and demonstrated an understanding of the instructions.  I provided over 30 minutes of exam, counseling, chart review and  complex critical decision making.        The patient was advised to call back or seek an in-person evaluation if the symptoms worsen or if the condition fails to improve as anticipated.   Marinus Maw, MD

## 2023-04-20 ENCOUNTER — Encounter: Payer: Self-pay | Admitting: Internal Medicine

## 2023-04-20 ENCOUNTER — Ambulatory Visit (INDEPENDENT_AMBULATORY_CARE_PROVIDER_SITE_OTHER): Payer: Medicare PPO | Admitting: Internal Medicine

## 2023-04-20 DIAGNOSIS — R7309 Other abnormal glucose: Secondary | ICD-10-CM

## 2023-04-20 DIAGNOSIS — E559 Vitamin D deficiency, unspecified: Secondary | ICD-10-CM

## 2023-04-20 DIAGNOSIS — J3089 Other allergic rhinitis: Secondary | ICD-10-CM

## 2023-04-20 DIAGNOSIS — F419 Anxiety disorder, unspecified: Secondary | ICD-10-CM

## 2023-04-20 DIAGNOSIS — E782 Mixed hyperlipidemia: Secondary | ICD-10-CM

## 2023-04-20 DIAGNOSIS — Z79899 Other long term (current) drug therapy: Secondary | ICD-10-CM

## 2023-04-20 DIAGNOSIS — J4521 Mild intermittent asthma with (acute) exacerbation: Secondary | ICD-10-CM

## 2023-04-20 DIAGNOSIS — I10 Essential (primary) hypertension: Secondary | ICD-10-CM

## 2023-04-20 MED ORDER — TOPIRAMATE 50 MG PO TABS: ORAL_TABLET | ORAL | 3 refills | Status: DC

## 2023-04-20 MED ORDER — ESCITALOPRAM OXALATE 20 MG PO TABS: ORAL_TABLET | ORAL | 3 refills | Status: DC

## 2023-04-20 MED ORDER — FLUTICASONE PROPIONATE 50 MCG/ACT NA SUSP: NASAL | 3 refills | Status: DC

## 2023-04-21 LAB — COMPLETE METABOLIC PANEL WITH GFR
AG Ratio: 1.6 (calc) (ref 1.0–2.5)
ALT: 11 U/L (ref 6–29)
AST: 15 U/L (ref 10–35)
Albumin: 4.1 g/dL (ref 3.6–5.1)
Alkaline phosphatase (APISO): 78 U/L (ref 37–153)
BUN: 17 mg/dL (ref 7–25)
CO2: 30 mmol/L (ref 20–32)
Calcium: 9.5 mg/dL (ref 8.6–10.4)
Chloride: 105 mmol/L (ref 98–110)
Creat: 0.79 mg/dL (ref 0.50–1.05)
Globulin: 2.5 g/dL (ref 1.9–3.7)
Glucose, Bld: 74 mg/dL (ref 65–99)
Potassium: 4 mmol/L (ref 3.5–5.3)
Sodium: 142 mmol/L (ref 135–146)
Total Bilirubin: 0.3 mg/dL (ref 0.2–1.2)
Total Protein: 6.6 g/dL (ref 6.1–8.1)
eGFR: 82 mL/min/{1.73_m2} (ref >=60)

## 2023-04-21 LAB — LIPID PANEL
Cholesterol: 151 mg/dL (ref <200)
HDL: 49 mg/dL — ABNORMAL LOW (ref >=50)
LDL Cholesterol (Calc): 75 mg/dL
Non-HDL Cholesterol (Calc): 102 mg/dL (ref <130)
Total CHOL/HDL Ratio: 3.1 (calc) (ref <5.0)
Triglycerides: 178 mg/dL — ABNORMAL HIGH (ref <150)

## 2023-04-21 LAB — CBC WITH DIFFERENTIAL/PLATELET
Absolute Lymphocytes: 1499 {cells}/uL (ref 850–3900)
Absolute Monocytes: 454 {cells}/uL (ref 200–950)
Basophils Absolute: 63 {cells}/uL (ref 0–200)
Basophils Relative: 1 %
Eosinophils Absolute: 170 {cells}/uL (ref 15–500)
Eosinophils Relative: 2.7 %
HCT: 42.8 % (ref 35.0–45.0)
Hemoglobin: 13.3 g/dL (ref 11.7–15.5)
MCH: 26.7 pg — ABNORMAL LOW (ref 27.0–33.0)
MCHC: 31.1 g/dL — ABNORMAL LOW (ref 32.0–36.0)
MCV: 85.8 fL (ref 80.0–100.0)
MPV: 11.1 fL (ref 7.5–12.5)
Monocytes Relative: 7.2 %
Neutro Abs: 4114 {cells}/uL (ref 1500–7800)
Neutrophils Relative %: 65.3 %
Platelets: 191 10*3/uL (ref 140–400)
RBC: 4.99 10*6/uL (ref 3.80–5.10)
RDW: 14.4 % (ref 11.0–15.0)
Total Lymphocyte: 23.8 %
WBC: 6.3 10*3/uL (ref 3.8–10.8)

## 2023-04-21 LAB — TSH: TSH: 2.3 m[IU]/L (ref 0.40–4.50)

## 2023-04-21 LAB — VITAMIN D 25 HYDROXY (VIT D DEFICIENCY, FRACTURES): Vit D, 25-Hydroxy: 57 ng/mL (ref 30–100)

## 2023-04-21 LAB — MAGNESIUM: Magnesium: 2.1 mg/dL (ref 1.5–2.5)

## 2023-04-21 LAB — HEMOGLOBIN A1C
Hgb A1c MFr Bld: 5.9 %{Hb} — ABNORMAL HIGH (ref <5.7)
Mean Plasma Glucose: 123 mg/dL
eAG (mmol/L): 6.8 mmol/L

## 2023-04-21 LAB — INSULIN, RANDOM: Insulin: 39.4 u[IU]/mL — ABNORMAL HIGH

## 2023-04-21 NOTE — Progress Notes (Signed)
<>*<>*<>*<>*<>*<>*<>*<>*<>*<>*<>*<>*<>*<>*<>*<>*<>*<>*<>*<>*<>*<>*<>*<>*<> <>*<>*<>*<>*<>*<>*<>*<>*<>*<>*<>*<>*<>*<>*<>*<>*<>*<>*<>*<>*<>*<>*<>*<>*<> -  Test results slightly outside the reference range are not unusual. If there is anything important, I will review this with you,  otherwise it is considered normal test values.  If you have further questions,  please do not hesitate to contact me at the office or via My Chart.  <>*<>*<>*<>*<>*<>*<>*<>*<>*<>*<>*<>*<>*<>*<>*<>*<>*<>*<>*<>*<>*<>*<>*<>*<> <>*<>*<>*<>*<>*<>*<>*<>*<>*<>*<>*<>*<>*<>*<>*<>*<>*<>*<>*<>*<>*<>*<>*<>*<>  -  Chol = 151   Excellent   - Very low risk for Heart Attack  / Stroke  <>*<>*<>*<>*<>*<>*<>*<>*<>*<>*<>*<>*<>*<>*<>*<>*<>*<>*<>*<>*<>*<>*<>*<>*<> <>*<>*<>*<>*<>*<>*<>*<>*<>*<>*<>*<>*<>*<>*<>*<>*<>*<>*<>*<>*<>*<>*<>*<>*<>  -  A1c = 5.9% still elevated in  the borderline and                                                           early or pre-diabetes range which has the same   300% increased risk for heart attack, stroke, cancer and    alzheimer- type vascular dementia as full blown diabetes.   But the good news is that diet, exercise with  weight loss can cure the early diabetes at this point.   - Avoid Sweets, Candy & White Stuff   - White Rice, White [place], White Flour  - Breads &  Pasta <>*<>*<>*<>*<>*<>*<>*<>*<>*<>*<>*<>*<>*<>*<>*<>*<>*<>*<>*<>*<>*<>*<>*<>*<> <>*<>*<>*<>*<>*<>*<>*<>*<>*<>*<>*<>*<>*<>*<>*<>*<>*<>*<>*<>*<>*<>*<>*<>*<>  -  Vitamin D = 57 - OK  <>*<>*<>*<>*<>*<>*<>*<>*<>*<>*<>*<>*<>*<>*<>*<>*<>*<>*<>*<>*<>*<>*<>*<>*<> <>*<>*<>*<>*<>*<>*<>*<>*<>*<>*<>*<>*<>*<>*<>*<>*<>*<>*<>*<>*<>*<>*<>*<>*<>  -  All Else - CBC - Kidneys - Electrolytes - Liver - Magnesium & Thyroid    - all  Normal / OK <>*<>*<>*<>*<>*<>*<>*<>*<>*<>*<>*<>*<>*<>*<>*<>*<>*<>*<>*<>*<>*<>*<>*<>*<> <>*<>*<>*<>*<>*<>*<>*<>*<>*<>*<>*<>*<>*<>*<>*<>*<>*<>*<>*<>*<>*<>*<>*<>*<>

## 2023-07-20 ENCOUNTER — Other Ambulatory Visit: Payer: Self-pay

## 2023-07-21 ENCOUNTER — Ambulatory Visit: Payer: Medicare PPO | Admitting: Nurse Practitioner

## 2023-07-22 ENCOUNTER — Other Ambulatory Visit: Payer: Self-pay

## 2023-07-22 MED ORDER — ATENOLOL 100 MG PO TABS: ORAL_TABLET | ORAL | 0 refills | Status: DC

## 2023-08-18 ENCOUNTER — Ambulatory Visit: Payer: Medicare PPO | Admitting: General Practice

## 2023-10-05 ENCOUNTER — Ambulatory Visit: Payer: Self-pay

## 2023-10-05 NOTE — Telephone Encounter (Signed)
 Copied from CRM [id number]. Topic: Clinical - Red Word Triage >> Oct 05, 2023 12:29 PM [name] F wrote: Red Word that prompted transfer to Nurse Triage: Patient believes she has a bladder infection, she's having pain/pressure in low abdominal/pelvic area. She's already scheduled for new patient appointment 10/12/23\  Chief Complaint: pain in lower abd when urinating Symptoms: see above Frequency: since Friday Pertinent Negatives: Patient denies flank pain, blood in urine Disposition: [] ED /[x] Urgent Care (no appt availability in office) / [] Appointment(In office/virtual)/ []  [facility]/ [] Home Care/ [] Refused Recommended Disposition /[] [facility]/ []  Follow-up with PCP Additional Notes: per protocol instructed to go to UC; no new patient apt available; care advice given, denies questions; instructed to go to ER if becomes worse.   Reason for Disposition  Age > 50 years  Answer Assessment - Initial Assessment Questions 1. SEVERITY: "How bad is the pain?"  (e.g., Scale 1-10; mild, moderate, or severe)   - MILD (1-3): complains slightly about urination hurting   - MODERATE (4-7): interferes with normal activities     - SEVERE (8-10): excruciating, unwilling or unable to urinate because of the pain      Denies pain with urination, just pain otherwise 2. FREQUENCY: "How many times have you had painful urination today?"      Painful in area 3. PATTERN: "Is pain present every time you urinate or just sometimes?"      Comes and goes 4. ONSET: "When did the painful urination start?"      Friday 5. FEVER: "Do you have a fever?" If Yes, ask: "What is your temperature, how was it measured, and when did it start?"     no 6. PAST UTI: "Have you had a urine infection before?" If Yes, ask: "When was the last time?" and "What happened that time?"      yes 7. CAUSE: "What do you think is causing the painful urination?"  (e.g., UTI, scratch, Herpes sore)     UTI 8. OTHER SYMPTOMS:  "Do you have any other symptoms?" (e.g., blood in urine, flank pain, genital sores, urgency, vaginal discharge)     no 9. PREGNANCY: "Is there any chance you are pregnant?" "When was your last menstrual period?"     no  Protocols used: Urination Pain - Female-A-AH

## 2023-10-06 ENCOUNTER — Other Ambulatory Visit: Payer: Self-pay

## 2023-10-06 ENCOUNTER — Ambulatory Visit: Admission: EM | Admit: 2023-10-06 | Discharge: 2023-10-06 | Disposition: A | Discharge status: Home / Self Care

## 2023-10-06 VITALS — BP 125/72 | HR 68 | Temp 98.1°F | Resp 18

## 2023-10-06 DIAGNOSIS — R109 Unspecified abdominal pain: Secondary | ICD-10-CM

## 2023-10-06 LAB — POCT URINALYSIS DIP (MANUAL ENTRY)
Bilirubin, UA: NEGATIVE
Blood, UA: NEGATIVE
Glucose, UA: NEGATIVE mg/dL
Ketones, POC UA: NEGATIVE mg/dL
Leukocytes, UA: NEGATIVE
Nitrite, UA: NEGATIVE
Protein Ur, POC: NEGATIVE mg/dL
Spec Grav, UA: 1.02 (ref 1.010–1.025)
Urobilinogen, UA: 0.2 U/dL
pH, UA: 6 (ref 5.0–8.0)

## 2023-10-06 NOTE — Discharge Instructions (Signed)
 No UTI

## 2023-10-06 NOTE — ED Triage Notes (Addendum)
 Pt here for UTI sx x 4 days with pain in bladder area

## 2023-10-06 NOTE — ED Provider Notes (Signed)
 [facility]    CSN: [id number] Arrival date & time: 10/06/23  1150      History   Chief Complaint Chief Complaint  Patient presents with   UTI Symptoms    HPI Whitney Herrera is a 68 y.o. female.   HPI Patient with a history of hypertension, GERD hyperlipidemia here today with concerns for possible UTI.  Patient report a discomfort in the lower abdomen area sensation of being bloated.  Symptoms were worse 3 days ago subsided without any specific intervention.  Has not had any urgency or dysuria.  She was concerned given the location of the lower abdominal pain that it may be related to a UTI.  She is scheduled to see her PCP next week.  Overall the symptoms of abdominal discomfort have improved over the last day wanted to be sure the UTI was not the source of symptoms.  Past Medical History:  Diagnosis Date   B12 deficiency    Prediabetes    Vitamin D  deficiency     Patient Active Problem List   Diagnosis Date Noted   FH: hypertension 10/03/2021   Attention deficit 02/22/2021   Iron  deficiency anemia 11/09/2019   Symptomatic anemia 11/04/2019   IDA (iron  deficiency anemia) 08/17/2019   Osteoporosis 10/15/2018   GERD (gastroesophageal reflux disease) 04/26/2018   Hyperlipidemia 04/26/2018   Sjogren's syndrome (HCC) 05/08/2014   Screening for heart disease 07/19/2013   Abnormal glucose    Essential hypertension    Hyperlipidemia, mixed    Morbid obesity (HCC) - BMI 40+    B12 deficiency    Vitamin D  deficiency     Past Surgical History:  Procedure Laterality Date   APPENDECTOMY     BREAST BIOPSY Right 06/02/1973   COLONOSCOPY     INGUINAL HERNIA REPAIR Bilateral age 50   UPPER GASTROINTESTINAL ENDOSCOPY      OB History   No obstetric history on file.      Home Medications    Prior to Admission medications   Medication Sig Start Date End Date Taking? Authorizing Provider  azithromycin  (ZITHROMAX ) 250 MG tablet Take 250 mg by mouth as  directed. 05/23/23  Yes [provider]  atenolol  (TENORMIN ) 100 MG tablet TAKE 1 TABLET BY MOUTH ONCE DAILY FOR BLOOD PRESSURE 07/22/23   Webb, Padonda B, FNP  Cholecalciferol (VITAMIN D  PO) Take 5,000 Units by mouth daily.     [provider]  Cyanocobalamin  (B-12 PO) Take by mouth.    [provider]  escitalopram  (LEXAPRO ) 20 MG tablet Take  1 tablet  Daily  for Mood 04/20/23   Vangie Genet, MD  Ferrous Sulfate (IRON  PO)     [provider]  fluticasone  (FLONASE ) 50 MCG/ACT nasal spray Use  1 to 2 sprays  each Nostril  1 to 2 x /day 04/20/23   Vangie Genet, MD  fluticasone -salmeterol (ADVAIR) 250-50 MCG/ACT AEPB Use 1 Inhalation 2 x /day ( every 12 hours) for Allergic Asthma 10/13/22   Vangie Genet, MD  Multiple Vitamins-Minerals (ONE DAILY MULTIVITAMIN WOMEN PO) Take 1 tablet by mouth daily. With iron  and B vitamins    [provider]  nystatin  (MYCOSTATIN ) 100000 UNIT/ML suspension Swish 5 mL in the mouth and retain for as long as possible (several minutes) before swallowing. Patient not taking: Reported on 01/15/2023 11/18/22   Cranford, Tonya, NP  olmesartan  (BENICAR ) 20 MG tablet Take  1/2 to 1 tablet  at night for BP 05/28/21   Vangie Genet, MD  phentermine  (ADIPEX-P ) 37.5 MG tablet TAKE 1 TABLET EVERY MORNING FOR DIETING AND WEIGHT LOSS 01/15/23   Cranford, Tonya, NP  predniSONE  (DELTASONE ) 10 MG tablet 1 tab 3 x day for 2 days, then 1 tab 2 x day for 2 days, then 1 tab 1 x day for 3 days Patient not taking: Reported on 04/20/2023 01/15/23   Langley Pippin, NP  rosuvastatin  (CRESTOR ) 20 MG tablet Take  1 tablet  Daily for Cholesterol                                                        /                                            TAKE 1                                        BY                                              MOUTH 10/25/22   Vangie Genet, MD  topiramate  (TOPAMAX ) 50 MG tablet Take  1 to  tablets  2 x /day  at  Suppertime & Bedtime  for Dieting & Weight Loss 04/20/23   Vangie Genet, MD  traZODone  (DESYREL ) 150 MG tablet Take 1/2 to 1 tablet at Bedtime as needed  for Sleep 07/01/22   Vangie Genet, MD  VENTOLIN  HFA 108 (90 Base) MCG/ACT inhaler INHALE 2 PUFFS INTO THE LUNGS EVERY 6 HOURS AS NEEDED FOR WHEEZING OR SHORTNESS OF BREATH. 03/20/23   Langley Pippin, NP    Family History Family History  Problem Relation Age of Onset   Hypertension Mother    Alzheimer's disease Mother    Hypertension Father    Alzheimer's disease Father    Colon cancer Neg Hx    Rectal cancer Neg Hx    Stomach cancer Neg Hx    Esophageal cancer Neg Hx     Social History Social History   Tobacco Use   Smoking status: Never   Smokeless tobacco: Never  Vaping Use   Vaping status: Never Used  Substance Use Topics   Alcohol use: Yes    Alcohol/week: 2.0 standard drinks of alcohol    Types: 2 Standard drinks or equivalent per week    Comment: wine   Drug use: No     Allergies   Lipitor [atorvastatin] and Wellbutrin [bupropion]   Review of Systems Review of Systems Pertinent negatives listed in HPI  Physical Exam Triage Vital Signs ED Triage Vitals [10/06/23 1205]  Encounter Vitals Group     BP 125/72     Systolic BP Percentile      Diastolic BP Percentile      Pulse Rate 68     Resp 18     Temp 98.1 F (36.7 C)     Temp Source Oral     SpO2 95 %     Weight  Height      Head Circumference      Peak Flow      Pain Score 2     Pain Loc      Pain Education      Exclude from Growth Chart    No data found.  Updated Vital Signs BP 125/72 (BP Location: Left Arm)   Pulse 68   Temp 98.1 F (36.7 C) (Oral)   Resp 18   SpO2 95%   Visual Acuity Right Eye Distance:   Left Eye Distance:   Bilateral Distance:    Right Eye Near:   Left Eye Near:    Bilateral Near:     Physical Exam General appearance: Alert, well developed, well nourished, cooperative Head: Normocephalic,  without obvious abnormality, atraumatic Heart: Rate and Rhythm normal.   Respiratory: Respirations even and unlabored, normal respiratory rate CVA: No flank pain Extremities: No gross deformities Skin: Skin color, texture, turgor normal. No rashes seen  Psych: Appropriate mood and affect.   UC Treatments / Results  Labs (all labs ordered are listed, but only abnormal results are displayed) Labs Reviewed  POCT URINALYSIS DIP (MANUAL ENTRY) - Normal    EKG   Radiology No results found.  Procedures Procedures (including critical care time)  Medications Ordered in UC Medications - No data to display  Initial Impression / Assessment and Plan / UC Course  I have reviewed the triage vital signs and the nursing notes.  Pertinent labs & imaging results that were available during my care of the patient were reviewed by me and considered in my medical decision making (see chart for details).    Abdominal Pressure, concern for possible UTI, UA unremarkable.  No changes in bowel or urinary habits, patient given precautions to watch and wait given symptoms appear to be improving. If symptoms become severe go to the nearest emergency department.  If symptoms resolve and occur prior to seeing PCP please discuss with PCP additional workup and evaluation of symptoms.  Patient verbalized understanding and agreement with plan Final Clinical Impressions(s) / UC Diagnoses   Final diagnoses:  Abdominal pressure     Discharge Instructions      No UTI.     ED Prescriptions   None    PDMP not reviewed this encounter.   Buena Carmine, NP 10/06/23 1334

## 2023-10-12 ENCOUNTER — Encounter: Payer: Self-pay | Admitting: Family

## 2023-10-12 ENCOUNTER — Ambulatory Visit: Payer: Medicare PPO | Admitting: Family

## 2023-10-12 ENCOUNTER — Other Ambulatory Visit (INDEPENDENT_AMBULATORY_CARE_PROVIDER_SITE_OTHER)

## 2023-10-12 VITALS — BP 136/82 | HR 76 | Temp 98.2°F | Ht 62.0 in | Wt 231.4 lb

## 2023-10-12 DIAGNOSIS — E559 Vitamin D deficiency, unspecified: Secondary | ICD-10-CM | POA: Diagnosis not present

## 2023-10-12 DIAGNOSIS — Z0001 Encounter for general adult medical examination with abnormal findings: Secondary | ICD-10-CM

## 2023-10-12 DIAGNOSIS — I10 Essential (primary) hypertension: Secondary | ICD-10-CM | POA: Diagnosis not present

## 2023-10-12 DIAGNOSIS — Z6827 Body mass index (BMI) 27.0-27.9, adult: Secondary | ICD-10-CM

## 2023-10-12 DIAGNOSIS — J4541 Moderate persistent asthma with (acute) exacerbation: Secondary | ICD-10-CM | POA: Diagnosis not present

## 2023-10-12 DIAGNOSIS — E538 Deficiency of other specified B group vitamins: Secondary | ICD-10-CM

## 2023-10-12 DIAGNOSIS — M810 Age-related osteoporosis without current pathological fracture: Secondary | ICD-10-CM

## 2023-10-12 DIAGNOSIS — R7303 Prediabetes: Secondary | ICD-10-CM | POA: Diagnosis not present

## 2023-10-12 DIAGNOSIS — M2550 Pain in unspecified joint: Secondary | ICD-10-CM

## 2023-10-12 DIAGNOSIS — Z87898 Personal history of other specified conditions: Secondary | ICD-10-CM | POA: Diagnosis not present

## 2023-10-12 DIAGNOSIS — F411 Generalized anxiety disorder: Secondary | ICD-10-CM | POA: Insufficient documentation

## 2023-10-12 DIAGNOSIS — Z1331 Encounter for screening for depression: Secondary | ICD-10-CM | POA: Insufficient documentation

## 2023-10-12 DIAGNOSIS — D509 Iron deficiency anemia, unspecified: Secondary | ICD-10-CM

## 2023-10-12 DIAGNOSIS — E782 Mixed hyperlipidemia: Secondary | ICD-10-CM | POA: Diagnosis not present

## 2023-10-12 DIAGNOSIS — G479 Sleep disorder, unspecified: Secondary | ICD-10-CM

## 2023-10-12 DIAGNOSIS — Z0000 Encounter for general adult medical examination without abnormal findings: Secondary | ICD-10-CM | POA: Insufficient documentation

## 2023-10-12 DIAGNOSIS — J40 Bronchitis, not specified as acute or chronic: Secondary | ICD-10-CM

## 2023-10-12 DIAGNOSIS — J45909 Unspecified asthma, uncomplicated: Secondary | ICD-10-CM | POA: Insufficient documentation

## 2023-10-12 LAB — COMPREHENSIVE METABOLIC PANEL WITH GFR
ALT: 15 U/L (ref 0–35)
AST: 17 U/L (ref 0–37)
Albumin: 4.2 g/dL (ref 3.5–5.2)
Alkaline Phosphatase: 62 U/L (ref 39–117)
BUN: 20 mg/dL (ref 6–23)
CO2: 30 meq/L (ref 19–32)
Calcium: 9.9 mg/dL (ref 8.4–10.5)
Chloride: 104 meq/L (ref 96–112)
Creatinine, Ser: 0.76 mg/dL (ref 0.40–1.20)
GFR: 80.92 mL/min (ref >60.00)
Glucose, Bld: 84 mg/dL (ref 70–99)
Potassium: 4.3 meq/L (ref 3.5–5.1)
Sodium: 141 meq/L (ref 135–145)
Total Bilirubin: 0.3 mg/dL (ref 0.2–1.2)
Total Protein: 6.7 g/dL (ref 6.0–8.3)

## 2023-10-12 LAB — LIPID PANEL
Cholesterol: 217 mg/dL — ABNORMAL HIGH (ref 0–200)
HDL: 49.3 mg/dL
LDL Cholesterol: 120 mg/dL — ABNORMAL HIGH (ref 0–99)
NonHDL: 167.45
Total CHOL/HDL Ratio: 4
Triglycerides: 237 mg/dL — ABNORMAL HIGH (ref 0.0–149.0)
VLDL: 47.4 mg/dL — ABNORMAL HIGH (ref 0.0–40.0)

## 2023-10-12 LAB — IBC + FERRITIN
Ferritin: 15.4 ng/mL (ref 10.0–291.0)
Iron: 97 ug/dL (ref 42–145)
Saturation Ratios: 21.5 % (ref 20.0–50.0)
TIBC: 452.2 ug/dL — ABNORMAL HIGH (ref 250.0–450.0)
Transferrin: 323 mg/dL (ref 212.0–360.0)

## 2023-10-12 LAB — MICROALBUMIN / CREATININE URINE RATIO
Creatinine,U: 113.2 mg/dL
Microalb Creat Ratio: UNDETERMINED mg/g (ref 0.0–30.0)
Microalb, Ur: <0.7 mg/dL

## 2023-10-12 LAB — CBC
HCT: 42.9 % (ref 36.0–46.0)
Hemoglobin: 13.7 g/dL (ref 12.0–15.0)
MCHC: 31.9 g/dL (ref 30.0–36.0)
MCV: 89.1 fl (ref 78.0–100.0)
Platelets: 209 10*3/uL (ref 150.0–400.0)
RBC: 4.82 Mil/uL (ref 3.87–5.11)
RDW: 14.8 % (ref 11.5–15.5)
WBC: 5.8 10*3/uL (ref 4.0–10.5)

## 2023-10-12 LAB — HEMOGLOBIN A1C: Hgb A1c MFr Bld: 6 % (ref 4.6–6.5)

## 2023-10-12 LAB — VITAMIN D 25 HYDROXY (VIT D DEFICIENCY, FRACTURES): VITD: 38.86 ng/mL (ref 30.00–100.00)

## 2023-10-12 LAB — SEDIMENTATION RATE: Sed Rate: 49 mm/h — ABNORMAL HIGH (ref 0–30)

## 2023-10-12 MED ORDER — ALBUTEROL SULFATE HFA 108 (90 BASE) MCG/ACT IN AERS: 2.0000 | INHALATION_SPRAY | Freq: Four times a day (QID) | RESPIRATORY_TRACT | 2 refills | Status: AC | PRN

## 2023-10-12 MED ORDER — PHENTERMINE HCL 8 MG PO TABS: 8.0000 mg | ORAL_TABLET | Freq: Every day | ORAL | 1 refills | Status: DC

## 2023-10-12 MED ORDER — TRAZODONE HCL 50 MG PO TABS: 25.0000 mg | ORAL_TABLET | Freq: Every evening | ORAL | Status: DC | PRN

## 2023-10-12 MED ORDER — FLUTICASONE-SALMETEROL 250-50 MCG/ACT IN AEPB: INHALATION_SPRAY | RESPIRATORY_TRACT | 3 refills | Status: AC

## 2023-10-12 MED ORDER — PREDNISONE 20 MG PO TABS: 20.0000 mg | ORAL_TABLET | Freq: Every day | ORAL | 0 refills | Status: AC

## 2023-10-12 MED ORDER — ROSUVASTATIN CALCIUM 20 MG PO TABS: ORAL_TABLET | ORAL | 3 refills | Status: DC

## 2023-10-12 MED ORDER — ATENOLOL 25 MG PO TABS: 25.0000 mg | ORAL_TABLET | Freq: Every day | ORAL | Status: AC

## 2023-10-12 NOTE — Assessment & Plan Note (Signed)
 Confirmed pt on 25 mg once daily atenolol  vs 100 mg.  Fixed in chart.  Stable blood pressure in office today. Goal ideally is < 130/90.

## 2023-10-12 NOTE — Assessment & Plan Note (Signed)
 Currently without flare.

## 2023-10-12 NOTE — Progress Notes (Signed)
 New Patient Office Visit  Subjective:  Patient ID: Whitney Herrera, female    DOB: [birth year]  Age: 68 y.o. MRN: [id number]  CC:  Chief Complaint  Patient presents with   Establish Care    HPI Whitney Herrera is here to establish care as a new patient as well as here for annual exam.  Oriented to practice routines and expectations.  Prior provider was: Dr. Cassondra Cliff, was a patient of his over 40 years. He recently passed and needs to establish here.   Pt is without acute concerns.   Mammogram, last in 2023. Schedule in July 2025. Has an order in place.  Dexa: has scheduled in July. With osteoporosis.  Colonoscopy, every seven years. Last 09/10/22  Does have advanced directive. Not in chart, will bring us  a copy.  Exercise, no established routine.  Sleep, does well with trazodone .  Utd with eye dental exams.pneumonia vaccine, prevnar 20 completed.    chronic concerns:  Obesity, on phentermine  37.5 mg once daily has been on this for 'several years'. She states hasn't seemed as effective recently.   HLD: on rosuvastatin  20 mg once nightly.   H/o palpitations in the past, and also with HTN, on atenolol  100 mg however she states was instructed to quarter them at 25 mg once daily. She states this   Asthma: has occasional flare ups. She is on advair 250/50 mcg however ran out of this about one week ago and she has been having to use her albuterol  more frequently than normal, 1 puff twice daily. She states when she gets bronchitis is will flare up her asthma.   Sleep disorder: takes 1/3 of her 150 mg trazodone  tablet and she states this helps her sleep, otherwise if she takes half and or a whole she feels more groggy.   IDA takes a MVI with iron . Tolerates well denies constipation.   Vitamin d  def: currently taking 5000 once daily vitamin D .     Allergic rhinitis, takes flonase  intermittently. Does take zyrtec at night time.   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 They will hurt and sometimes at night will wake up with some aching and swollen. She does have d/o sjogrens, doesn't currently see a rheumatolgy.     ROS: Negative unless specifically indicated above in HPI.   Current Outpatient Medications:    atenolol  (TENORMIN ) 25 MG tablet, Take 1 tablet (25 mg total) by mouth daily., Disp: , Rfl:    cetirizine (ZYRTEC) 10 MG chewable tablet, Chew 10 mg by mouth daily., Disp: , Rfl:    Cholecalciferol (VITAMIN D  PO), Take 5,000 Units by mouth daily. , Disp: , Rfl:    Cyanocobalamin  (B-12 PO), Take by mouth., Disp: , Rfl:    escitalopram  (LEXAPRO ) 20 MG tablet, Take  1 tablet  Daily  for Mood, Disp: 90 tablet, Rfl: 3   Multiple Vitamins-Minerals (ONE DAILY MULTIVITAMIN WOMEN PO), Take 1 tablet by mouth daily. With iron  and B vitamins, Disp: , Rfl:    Phentermine  HCl 8 MG TABS, Take 1 tablet (8 mg total) by mouth daily., Disp: 90 tablet, Rfl: 1   predniSONE  (DELTASONE ) 20 MG tablet, Take 1 tablet (20 mg total) by mouth daily with breakfast for 10 days., Disp: 10 tablet, Rfl: 0   topiramate  (TOPAMAX ) 50 MG tablet, Take  1 to  tablets  2 x /day  at Suppertime & Bedtime  for Dieting & Weight Loss, Disp: 180 tablet, Rfl: 3  traZODone  (DESYREL ) 50 MG tablet, Take 0.5-1 tablets (25-50 mg total) by mouth at bedtime as needed for sleep., Disp: , Rfl:    albuterol  (VENTOLIN  HFA) 108 (90 Base) MCG/ACT inhaler, Inhale 2 puffs into the lungs every 6 (six) hours as needed for wheezing or shortness of breath., Disp: 8 g, Rfl: 2   fluticasone -salmeterol (ADVAIR) 250-50 MCG/ACT AEPB, Use 1 Inhalation 2 x /day ( every 12 hours) for Allergic Asthma, Disp: 180 each, Rfl: 3   rosuvastatin  (CRESTOR ) 20 MG tablet, Take  1 tablet  Daily for Cholesterol                                                        /                                            TAKE 1                                        BY                                              MOUTH, Disp: 90 tablet, Rfl:  3 Past Medical History:  Diagnosis Date   B12 deficiency    Prediabetes    Vitamin D  deficiency    Past Surgical History:  Procedure Laterality Date   APPENDECTOMY     BREAST BIOPSY Right 06/02/1973   COLONOSCOPY     INGUINAL HERNIA REPAIR Right age 69   UPPER GASTROINTESTINAL ENDOSCOPY      Objective:   Today's Vitals: BP 136/82 (BP Location: Left Arm, Patient Position: Sitting, Cuff Size: Large)   Pulse 76   Temp 98.2 F (36.8 C) (Temporal)   Ht 5\' 2"  (1.575 m)   Wt 231 lb 6.4 oz (105 kg)   SpO2 98%   BMI 42.32 kg/m   Physical Exam Constitutional:      General: She is not in acute distress.    Appearance: Normal appearance. She is obese. She is not ill-appearing.  HENT:     Head: Normocephalic.     Right Ear: Tympanic membrane normal.     Left Ear: Tympanic membrane normal.     Nose: Nose normal.     Mouth/Throat:     Mouth: Mucous membranes are moist.  Eyes:     Extraocular Movements: Extraocular movements intact.     Pupils: Pupils are equal, round, and reactive to light.  Cardiovascular:     Rate and Rhythm: Normal rate and regular rhythm.  Pulmonary:     Effort: Pulmonary effort is normal.     Breath sounds: Normal breath sounds.  Abdominal:     General: Abdomen is flat. Bowel sounds are normal.     Palpations: Abdomen is soft.     Tenderness: There is no guarding or rebound.  Musculoskeletal:        General: Normal range of motion.     Cervical back: Normal range of motion.  Skin:    General: Skin is warm.     Capillary Refill: Capillary refill takes less than 2 seconds.  Neurological:     General: No focal deficit present.     Mental Status: She is alert.  Psychiatric:        Mood and Affect: Mood normal.        Behavior: Behavior normal.        Thought Content: Thought content normal.        Judgment: Judgment normal.     Assessment & Plan:  Essential hypertension Assessment & Plan: Confirmed pt on 25 mg once daily atenolol  vs 100 mg.   Fixed in chart.  Stable blood pressure in office today. Goal ideally is < 130/90.    Orders: -     Atenolol ; Take 1 tablet (25 mg total) by mouth daily. -     Comprehensive metabolic panel with GFR -     Microalbumin / creatinine urine ratio  History of palpitations -     Atenolol ; Take 1 tablet (25 mg total) by mouth daily.  Iron  deficiency anemia, unspecified iron  deficiency anemia type Assessment & Plan: Ibc ferritin and cbc ordered pending results.   Orders: -     CBC -     IBC + Ferritin  Hyperlipidemia, mixed Assessment & Plan: Refill sent in for rosuvastatin   Ordered lipid panel, pending results. Work on low cholesterol diet and exercise as tolerated   Orders: -     Lipid panel -     Rosuvastatin  Calcium ; Take  1 tablet  Daily for Cholesterol                                                        /                                            TAKE 1                                        BY                                              MOUTH  Dispense: 90 tablet; Refill: 3  Vitamin D  deficiency Assessment & Plan: Ordered vitamin d  pending results.  Will await results to see if we need to adjust otc dose.   Orders: -     VITAMIN D  25 Hydroxy (Vit-D Deficiency, Fractures)  B12 deficiency  Osteoporosis without current pathological fracture, unspecified osteoporosis type  Prediabetes Assessment & Plan: Pt advised of the following: Work on a diabetic diet, try to incorporate exercise at least 20-30 a day for 3 days a week or more.    Orders: -     Hemoglobin A1c  GAD (generalized anxiety disorder)  Sleep disorder Assessment & Plan: Pt confirmed taking 1/3 of her 150 mg dose, so she is taking trazodone  50 mg nightly.  Updated in chart for next refill.   Orders: -  traZODone  HCl; Take 0.5-1 tablets (25-50 mg total) by mouth at bedtime as needed for sleep.  Morbid obesity (HCC) - BMI 40+ Assessment & Plan: Pt was on phenterine 27.5 with topamax  50 mg  once daliy.  Discussed with pt the long term possible s/e of phentermine  at higher doses.  She is agreeable to lower phentermine  doses at 8 mg once daily as topamax  cn continue at 50 mg once daily. Will re evaluate need for this in the next f/u visit as pt states has not been overly helpful.  Pt advised to work on diet and exercise as tolerated   Orders: -     Phentermine  HCl; Take 1 tablet (8 mg total) by mouth daily.  Dispense: 90 tablet; Refill: 1  Bronchitis  Encounter for general adult medical examination without abnormal findings Assessment & Plan: Patient Counseling(The following topics were reviewed):  Preventative care handout given to pt  Health maintenance and immunizations reviewed. Please refer to Health maintenance section. Pt advised on safe sex, wearing seatbelts in car, and proper nutrition labwork ordered today for annual Dental health: Discussed importance of regular tooth brushing, flossing, and dental visits.     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  Dispense: 8 g; Refill: 2 -     predniSONE ; Take 1 tablet (20 mg total) by mouth daily with breakfast for 10 days.  Dispense: 10 tablet; Refill: 0  Polyarthralgia -     Sedimentation rate -     Rheumatoid factor -     ANA Screen,IFA, with Reflex to Titer and Pattern (REFL)   Multiple acute and chronic concerns were discussed during visit in addition to annual physical health maintenace activities.  This totaled an additional 20 minutes on to the Cpe.   Follow-up: Return in about 6 months (around 04/13/2024) for f/u cholesterol.    Felicita Horns, FNP

## 2023-10-12 NOTE — Patient Instructions (Signed)
  Try some over the counter voltaren gel

## 2023-10-12 NOTE — Assessment & Plan Note (Signed)
 Restart advair  Refill albuterol   Rx prednisone  pack take as directed

## 2023-10-12 NOTE — Assessment & Plan Note (Signed)
 Ibc ferritin and cbc ordered pending results

## 2023-10-12 NOTE — Assessment & Plan Note (Signed)
 Refill sent in for rosuvastatin   Ordered lipid panel, pending results. Work on low cholesterol diet and exercise as tolerated

## 2023-10-12 NOTE — Assessment & Plan Note (Signed)

## 2023-10-12 NOTE — Addendum Note (Signed)
 Addended by: Bernadene Brewer on: 10/12/2023 03:51 PM   Modules accepted: Orders

## 2023-10-12 NOTE — Assessment & Plan Note (Signed)
 Pt advised of the following: Work on a diabetic diet, try to incorporate exercise at least 20-30 a day for 3 days a week or more.

## 2023-10-12 NOTE — Assessment & Plan Note (Signed)
 Pt confirmed taking 1/3 of her 150 mg dose, so she is taking trazodone  50 mg nightly.  Updated in chart for next refill.

## 2023-10-12 NOTE — Assessment & Plan Note (Signed)
 Ordered vitamin d  pending results.  Will await results to see if we need to adjust otc dose.

## 2023-10-12 NOTE — Assessment & Plan Note (Signed)
 Pt was on phenterine 27.5 with topamax  50 mg once daliy.  Discussed with pt the long term possible s/e of phentermine  at higher doses.  She is agreeable to lower phentermine  doses at 8 mg once daily as topamax  cn continue at 50 mg once daily. Will re evaluate need for this in the next f/u visit as pt states has not been overly helpful.  Pt advised to work on diet and exercise as tolerated

## 2023-10-13 ENCOUNTER — Other Ambulatory Visit: Payer: Self-pay | Admitting: Family

## 2023-10-13 ENCOUNTER — Encounter: Payer: Self-pay | Admitting: Family

## 2023-10-13 DIAGNOSIS — E782 Mixed hyperlipidemia: Secondary | ICD-10-CM

## 2023-10-13 MED ORDER — ROSUVASTATIN CALCIUM 40 MG PO TABS: 40.0000 mg | ORAL_TABLET | Freq: Every day | ORAL | 3 refills | Status: DC

## 2023-10-13 MED ORDER — ROSUVASTATIN CALCIUM 20 MG PO TABS: 20.0000 mg | ORAL_TABLET | Freq: Every day | ORAL | Status: AC

## 2023-10-14 LAB — ANA TITER AND PATTERN REFLEX: ANA Titer 1: 1:40 {titer} — ABNORMAL HIGH

## 2023-10-14 LAB — RHEUMATOID FACTOR: Rheumatoid fact SerPl-aCnc: <10 [IU]/mL (ref <14)

## 2023-10-14 LAB — ANA SCREEN,IFA, WITH REFLEX TO TITER AND PATTERN (REFL): ANA SCREEN, IFA: POSITIVE — AB

## 2023-10-18 ENCOUNTER — Ambulatory Visit: Payer: Self-pay | Admitting: Family

## 2023-10-18 ENCOUNTER — Other Ambulatory Visit: Payer: Self-pay | Admitting: Family

## 2023-10-18 DIAGNOSIS — M3500 Sicca syndrome, unspecified: Secondary | ICD-10-CM

## 2023-10-18 DIAGNOSIS — M2550 Pain in unspecified joint: Secondary | ICD-10-CM

## 2023-10-18 DIAGNOSIS — R768 Other specified abnormal immunological findings in serum: Secondary | ICD-10-CM

## 2023-10-20 ENCOUNTER — Encounter: Payer: Medicare PPO | Admitting: Internal Medicine

## 2023-11-17 ENCOUNTER — Other Ambulatory Visit: Payer: Self-pay | Admitting: Family

## 2023-11-17 MED ORDER — TOPIRAMATE 50 MG PO TABS
ORAL_TABLET | ORAL | 1 refills | Status: DC
Start: 2023-11-17 — End: 2024-04-07

## 2023-12-07 IMAGING — MG MM DIGITAL SCREENING BILAT W/ TOMO AND CAD
8 series · 8 of 24 positions shown · non-contrast
Comparison: Previous exam(s).

CLINICAL DATA: Screening.

EXAM:
DIGITAL SCREENING BILATERAL MAMMOGRAM WITH TOMOSYNTHESIS AND CAD
TECHNIQUE: Bilateral screening digital craniocaudal and mediolateral oblique
mammograms were obtained. Bilateral screening digital breast
tomosynthesis was performed. The images were evaluated with
computer-aided detection.

[L CC synth-2D]
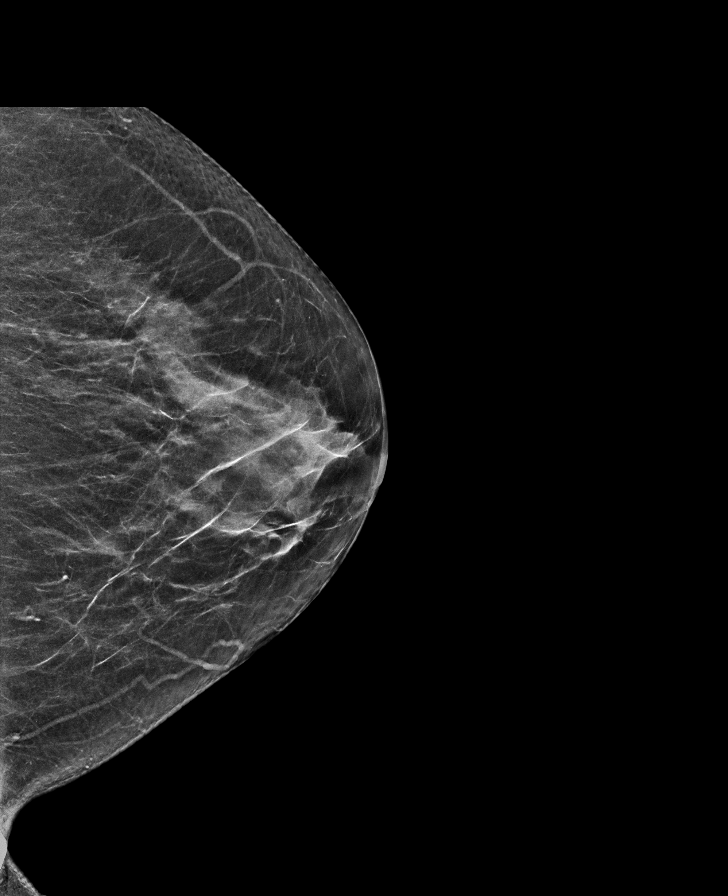

[L MLO synth-2D]
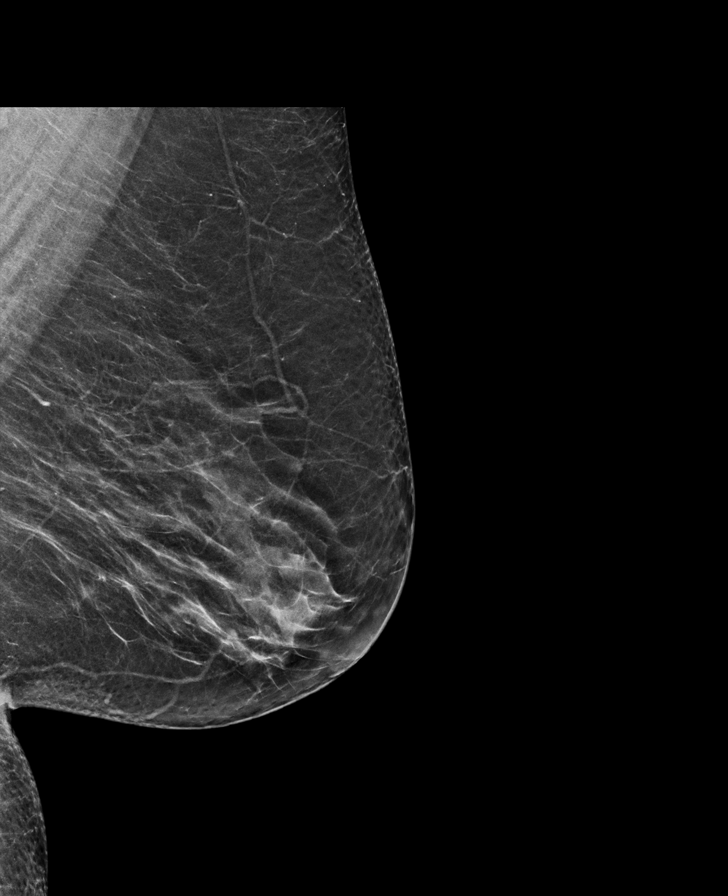

[R MLO synth-2D]
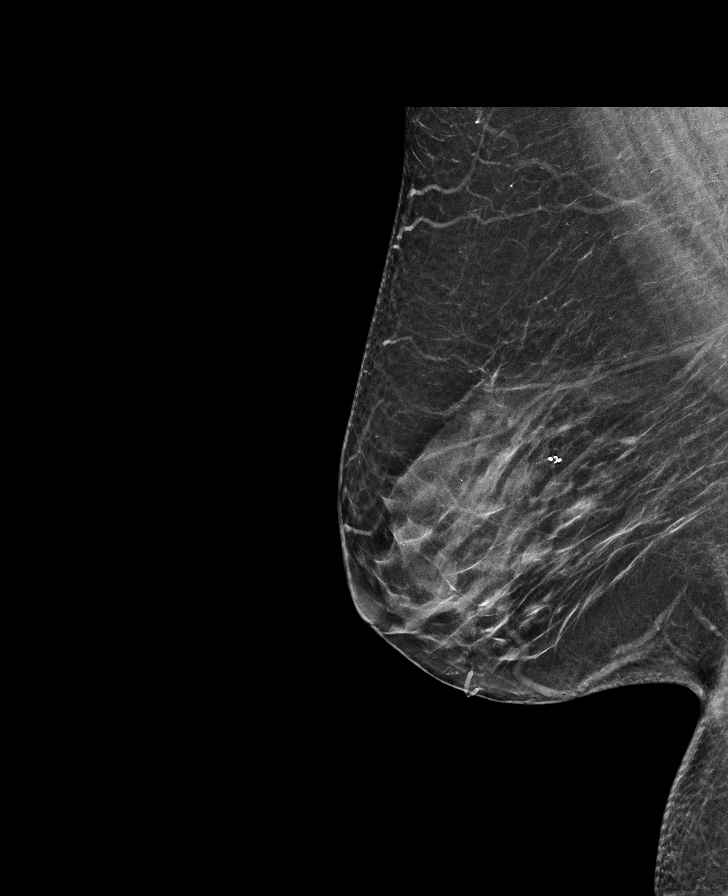

[R CC synth-2D]
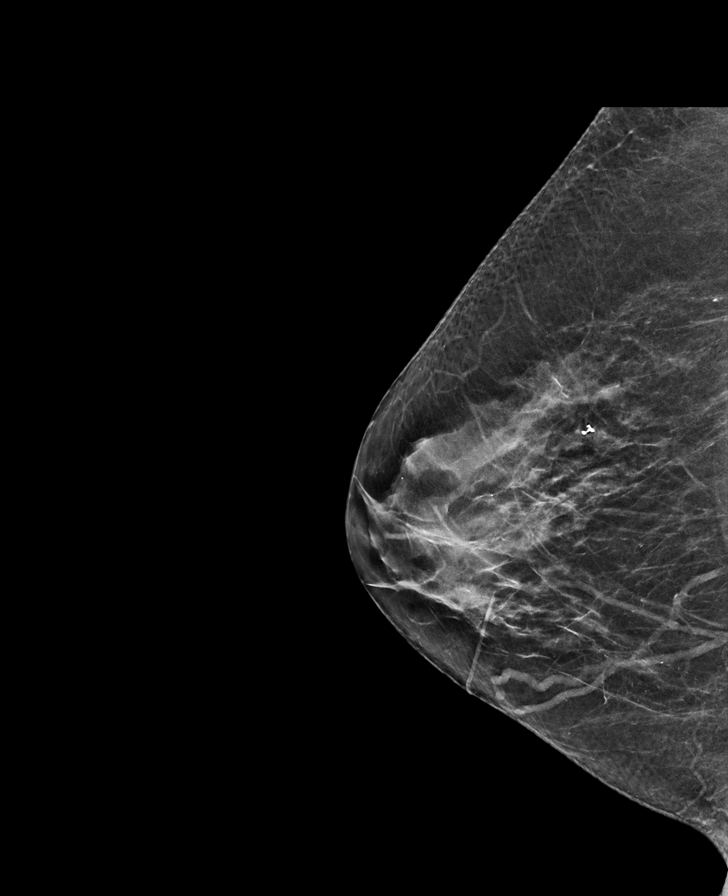

[L MLO tomo · tomo slice 29/57.0]
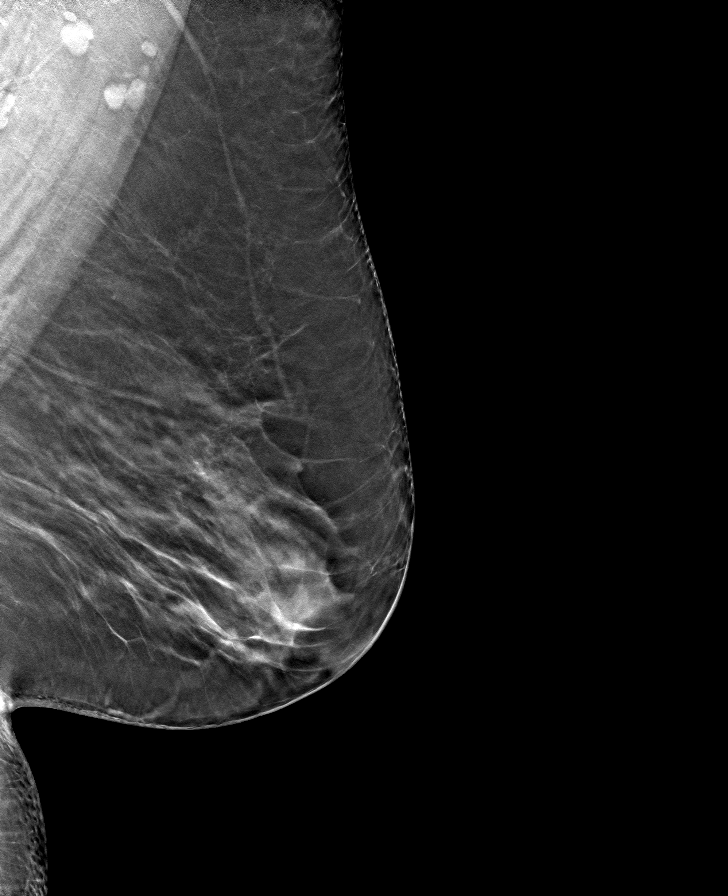

[R MLO tomo · tomo slice 29/58.0]
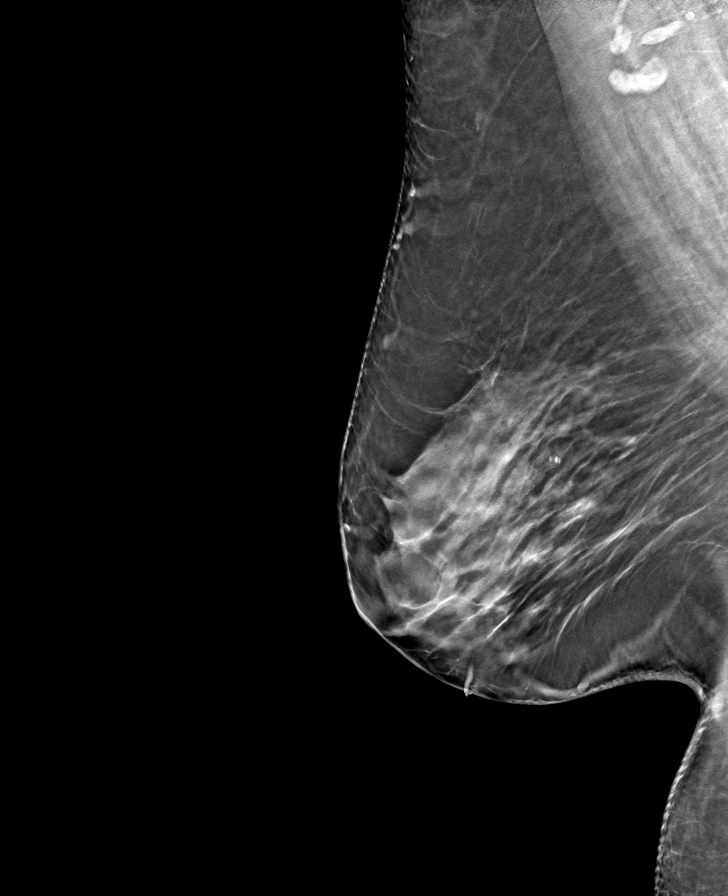

[R CC tomo · tomo slice 27/54.0]
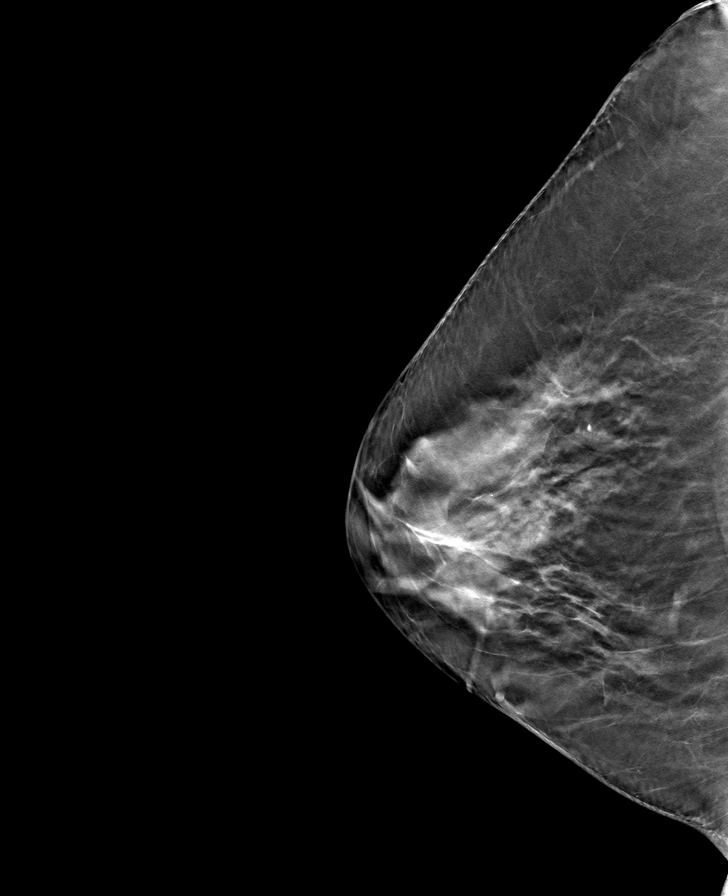

[L CC tomo · tomo slice 28/55.0]
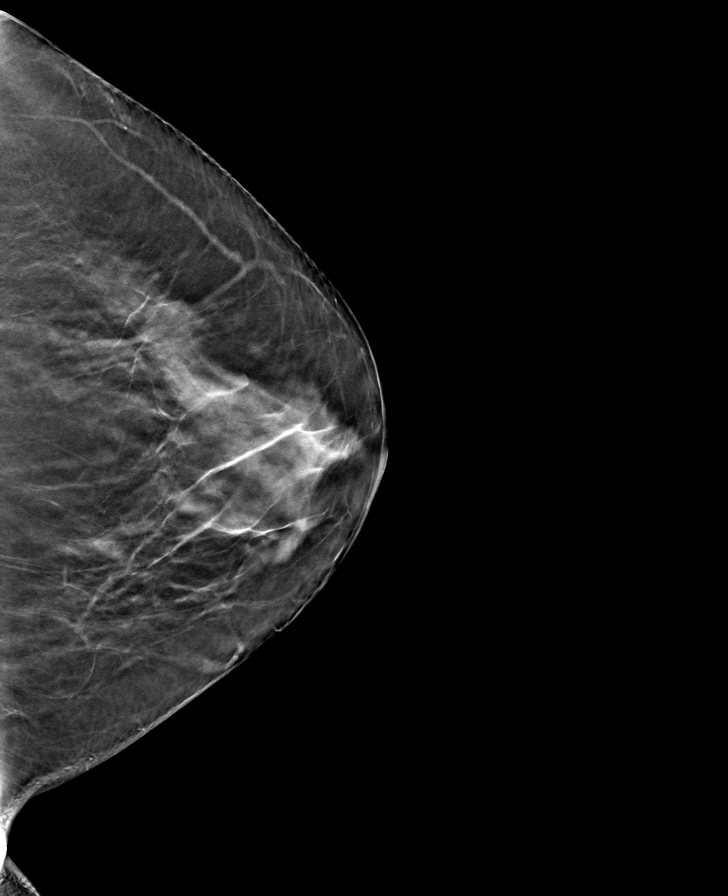

[8 of 24 positions shown; findings below may reference images not displayed]

ACR Breast Density Category c: The breast tissue is heterogeneously
dense, which may obscure small masses.
FINDINGS: There are no findings suspicious for malignancy.
IMPRESSION: No mammographic evidence of malignancy. A result letter of this
screening mammogram will be mailed directly to the patient.

RECOMMENDATION:
Screening mammogram in one year. (Code:Q3-W-BC3)

BI-RADS CATEGORY  1: Negative.

## 2023-12-22 DIAGNOSIS — Z6841 Body Mass Index (BMI) 40.0 and over, adult: Secondary | ICD-10-CM | POA: Diagnosis not present

## 2023-12-22 DIAGNOSIS — Z01419 Encounter for gynecological examination (general) (routine) without abnormal findings: Secondary | ICD-10-CM | POA: Diagnosis not present

## 2023-12-24 DIAGNOSIS — Z1231 Encounter for screening mammogram for malignant neoplasm of breast: Secondary | ICD-10-CM | POA: Diagnosis not present

## 2023-12-24 DIAGNOSIS — M8588 Other specified disorders of bone density and structure, other site: Secondary | ICD-10-CM | POA: Diagnosis not present

## 2023-12-24 DIAGNOSIS — N958 Other specified menopausal and perimenopausal disorders: Secondary | ICD-10-CM | POA: Diagnosis not present

## 2023-12-30 ENCOUNTER — Other Ambulatory Visit: Payer: Self-pay | Admitting: Obstetrics and Gynecology

## 2023-12-30 DIAGNOSIS — R928 Other abnormal and inconclusive findings on diagnostic imaging of breast: Secondary | ICD-10-CM

## 2024-01-18 ENCOUNTER — Ambulatory Visit: Payer: Medicare PPO | Admitting: Nurse Practitioner

## 2024-01-18 ENCOUNTER — Encounter

## 2024-01-18 ENCOUNTER — Other Ambulatory Visit

## 2024-01-21 ENCOUNTER — Encounter: Payer: Self-pay | Admitting: Hematology

## 2024-01-21 ENCOUNTER — Ambulatory Visit
Admission: RE | Admit: 2024-01-21 | Discharge: 2024-01-21 | Disposition: A | Discharge status: Home / Self Care | Source: Ambulatory Visit | Attending: Obstetrics and Gynecology | Admitting: Obstetrics and Gynecology

## 2024-01-21 DIAGNOSIS — N6002 Solitary cyst of left breast: Secondary | ICD-10-CM | POA: Diagnosis not present

## 2024-01-21 DIAGNOSIS — R928 Other abnormal and inconclusive findings on diagnostic imaging of breast: Secondary | ICD-10-CM | POA: Diagnosis not present

## 2024-01-23 DIAGNOSIS — J45909 Unspecified asthma, uncomplicated: Secondary | ICD-10-CM | POA: Diagnosis not present

## 2024-01-23 DIAGNOSIS — K219 Gastro-esophageal reflux disease without esophagitis: Secondary | ICD-10-CM | POA: Diagnosis not present

## 2024-01-23 DIAGNOSIS — M3500 Sicca syndrome, unspecified: Secondary | ICD-10-CM | POA: Diagnosis not present

## 2024-01-23 DIAGNOSIS — E785 Hyperlipidemia, unspecified: Secondary | ICD-10-CM | POA: Diagnosis not present

## 2024-01-23 DIAGNOSIS — G4700 Insomnia, unspecified: Secondary | ICD-10-CM | POA: Diagnosis not present

## 2024-01-23 DIAGNOSIS — R7303 Prediabetes: Secondary | ICD-10-CM | POA: Diagnosis not present

## 2024-01-23 DIAGNOSIS — F411 Generalized anxiety disorder: Secondary | ICD-10-CM | POA: Diagnosis not present

## 2024-01-23 DIAGNOSIS — I10 Essential (primary) hypertension: Secondary | ICD-10-CM | POA: Diagnosis not present

## 2024-01-24 NOTE — Progress Notes (Unsigned)
 Office: [phone]  /  Fax: [phone]   Initial Visit    Whitney Herrera was seen in clinic today to evaluate for obesity. She is interested in losing weight to improve overall health and reduce the risk of weight related complications. She presents today to review program treatment options, initial physical assessment, and evaluation.     She was referred by: {emreferby:28303}  When asked what else they would like to accomplish? She states: {EMHopetoaccomplish:28304::Adopt a healthier eating pattern and lifestyle,Improve energy levels and physical activity,Improve existing medical conditions,Improve quality of life}  When asked how has your weight affected you? She states: {EMWeightAffected:28305}  Weight history: ***  Highest weight: ***  Some associated conditions: {EMSomeConditions:28306}  Contributing factors: {EMcontributingfactors:28307}  Weight promoting medications identified: {EMWeightpromotingrx:28308}  Prior weight loss attempts: {emweightlossprograms:31590::None}  Current nutrition plan: {EMNutritionplan:28309::None}  Current level of physical activity: {EMcurrentPA:28310::None}  Current or previous pharmacotherapy: {EM previousRx:28311}  Response to medication: {EMResponsetomedication:28312}   Past medical history includes:   Past Medical History:  Diagnosis Date   B12 deficiency    Prediabetes    Vitamin D  deficiency      Objective    There were no vitals taken for this visit. She was weighed on the bioimpedance scale: There is no height or weight on file to calculate BMI.  Body Fat%:***, Visceral Fat Rating:***, Weight trend over the last 12 months: {emweighttrend:28333}  General:  Alert, oriented and cooperative. Patient is in no acute distress.  Respiratory: Normal respiratory effort, no problems with respiration noted   Gait: able to ambulate independently  Mental Status: Normal mood and affect. Normal behavior. Normal  judgment and thought content.   DIAGNOSTIC DATA REVIEWED:  BMET    Component Value Date/Time   NA 141 10/12/2023 1243   NA 141 05/10/2013 1441   K 4.3 10/12/2023 1243   K 4.1 05/10/2013 1441   CL 104 10/12/2023 1243   CO2 30 10/12/2023 1243   CO2 24 05/10/2013 1441   GLUCOSE 84 10/12/2023 1243   GLUCOSE 87 05/10/2013 1441   BUN 20 10/12/2023 1243   BUN 15.0 05/10/2013 1441   CREATININE 0.76 10/12/2023 1243   CREATININE 0.79 04/20/2023 1042   CREATININE 0.8 05/10/2013 1441   CALCIUM  9.9 10/12/2023 1243   CALCIUM  9.9 05/10/2013 1441   GFRNONAA >60 02/15/2021 1116   GFRNONAA 90 10/01/2020 1503   GFRAA 104 10/01/2020 1503   Lab Results  Component Value Date   HGBA1C 6.0 10/12/2023   HGBA1C 5.9 (H) 04/20/2013   No results found for: INSULIN  CBC    Component Value Date/Time   WBC 5.8 10/12/2023 1243   RBC 4.82 10/12/2023 1243   HGB 13.7 10/12/2023 1243   HGB 12.2 02/15/2021 1116   HGB 13.7 05/10/2013 1441   HCT 42.9 10/12/2023 1243   HCT 41.4 05/10/2013 1441   PLT 209.0 10/12/2023 1243   PLT 226 02/15/2021 1116   PLT 187 05/10/2013 1441   MCV 89.1 10/12/2023 1243   MCV 89.3 05/10/2013 1441   MCH 26.7 (L) 04/20/2023 1042   MCHC 31.9 10/12/2023 1243   RDW 14.8 10/12/2023 1243   RDW 13.6 05/10/2013 1441   Iron /TIBC/Ferritin/ %Sat    Component Value Date/Time   IRON  97 10/12/2023 1613   TIBC 452.2 (H) 10/12/2023 1613   FERRITIN 15.4 10/12/2023 1613   IRONPCTSAT 21.5 10/12/2023 1613   IRONPCTSAT 18 09/22/2022 1141   Lipid Panel     Component Value Date/Time   CHOL 217 (H) 10/12/2023 1243  TRIG 237.0 (H) 10/12/2023 1243   HDL 49.30 10/12/2023 1243   CHOLHDL 4 10/12/2023 1243   VLDL 47.4 (H) 10/12/2023 1243   LDLCALC 120 (H) 10/12/2023 1243   LDLCALC 75 04/20/2023 1042   Hepatic Function Panel     Component Value Date/Time   PROT 6.7 10/12/2023 1243   PROT 7.6 05/10/2013 1441   ALBUMIN 4.2 10/12/2023 1243   ALBUMIN 4.1 05/10/2013 1441   AST 17  10/12/2023 1243   AST 15 02/15/2021 1116   AST 17 05/10/2013 1441   ALT 15 10/12/2023 1243   ALT 13 02/15/2021 1116   ALT 18 05/10/2013 1441   ALKPHOS 62 10/12/2023 1243   ALKPHOS 73 05/10/2013 1441   BILITOT 0.3 10/12/2023 1243   BILITOT 0.4 02/15/2021 1116   BILITOT 0.33 05/10/2013 1441   BILIDIR 0.1 12/06/2019 1112   IBILI 0.3 12/06/2019 1112      Component Value Date/Time   TSH 2.30 04/20/2023 1042     Assessment and Plan   Positive ANA (antinuclear antibody)  Sjogren's syndrome, with unspecified organ involvement (HCC)  Polyarthralgia  Hyperlipidemia, mixed  Essential hypertension  History of palpitations  Iron  deficiency anemia, unspecified iron  deficiency anemia type  Vitamin D  deficiency  B12 deficiency  Osteoporosis without current pathological fracture, unspecified osteoporosis type  Prediabetes  GAD (generalized anxiety disorder)  Sleep disorder  Moderate persistent reactive airway disease with acute exacerbation  Attention deficit  Class 3 severe obesity due to excess calories with serious comorbidity and body mass index (BMI) of 40.0 to 44.9 in adult    Assessment and Plan Assessment & Plan         Obesity Treatment / Action Plan:  {EMobesityactionplanscribe:28314::Patient will work on garnering support from family and friends to begin weight loss journey.,Will work on eliminating or reducing the presence of highly palatable, calorie dense foods in the home.,Will complete provided nutritional and psychosocial assessment questionnaire before the next appointment.,Will be scheduled for indirect calorimetry to determine resting energy expenditure in a fasting state.  This will allow us  to create a reduced calorie, high-protein meal plan to promote loss of fat mass while preserving muscle mass.,Counseled on the health benefits of losing 5%-15% of total body weight.,Was counseled on nutritional approaches to weight loss and benefits  of reducing processed foods and consuming plant-based foods and high quality protein as part of nutritional weight management.,Was counseled on pharmacotherapy and role as an adjunct in weight management. }  Obesity Education Performed Today:  She was weighed on the bioimpedance scale and results were discussed and documented in the synopsis.  We discussed obesity as a disease and the importance of a more detailed evaluation of all the factors contributing to the disease.  We discussed the importance of long term lifestyle changes which include nutrition, exercise and behavioral modifications as well as the importance of customizing this to her specific health and social needs.  We discussed the benefits of reaching a healthier weight to alleviate the symptoms of existing conditions and reduce the risks of the biomechanical, metabolic and psychological effects of obesity.  We reviewed the four pillars of obesity medicine and importance of using a multimodal approach.  We reviewed the basic principles in weight management.   Nadiah H Lunney appears to be in the action stage of change and states they are ready to start intensive lifestyle modifications and behavioral modifications.  I have spent *** minutes in the care of the patient today including: {NUMBER 1-10:22536} minutes before the  visit reviewing and preparing the chart. *** minutes face-to-face {emfacetoface:32598::assessing and reviewing listed medical problems as outlined in obesity care plan,providing nutritional and behavioral counseling on topics outlined in the obesity care plan,independently interpreting test results and goals of care, as described in assessment and plan,reviewing and discussing biometric information and progress} {NUMBER 1-10:22536} minutes after the visit updating chart and documentation of encounter.  Reviewed by clinician on day of visit: allergies, medications, problem list, medical history,  surgical history, family history, social history, and previous encounter notes pertinent to obesity diagnosis.   Skylin Kennerson,PA-C

## 2024-01-25 ENCOUNTER — Ambulatory Visit (INDEPENDENT_AMBULATORY_CARE_PROVIDER_SITE_OTHER): Admitting: Physician Assistant

## 2024-01-25 ENCOUNTER — Encounter (INDEPENDENT_AMBULATORY_CARE_PROVIDER_SITE_OTHER): Payer: Self-pay | Admitting: Physician Assistant

## 2024-01-25 VITALS — BP 116/52 | HR 57 | Temp 98.2°F | Ht 62.0 in | Wt 229.8 lb

## 2024-01-25 DIAGNOSIS — R7303 Prediabetes: Secondary | ICD-10-CM

## 2024-01-25 DIAGNOSIS — J4541 Moderate persistent asthma with (acute) exacerbation: Secondary | ICD-10-CM

## 2024-01-25 DIAGNOSIS — E66813 Obesity, class 3: Secondary | ICD-10-CM | POA: Diagnosis not present

## 2024-01-25 DIAGNOSIS — Z0289 Encounter for other administrative examinations: Secondary | ICD-10-CM

## 2024-01-25 DIAGNOSIS — Z87898 Personal history of other specified conditions: Secondary | ICD-10-CM

## 2024-01-25 DIAGNOSIS — M2550 Pain in unspecified joint: Secondary | ICD-10-CM

## 2024-01-25 DIAGNOSIS — E538 Deficiency of other specified B group vitamins: Secondary | ICD-10-CM

## 2024-01-25 DIAGNOSIS — I10 Essential (primary) hypertension: Secondary | ICD-10-CM

## 2024-01-25 DIAGNOSIS — R41840 Attention and concentration deficit: Secondary | ICD-10-CM

## 2024-01-25 DIAGNOSIS — D509 Iron deficiency anemia, unspecified: Secondary | ICD-10-CM

## 2024-01-25 DIAGNOSIS — Z6841 Body Mass Index (BMI) 40.0 and over, adult: Secondary | ICD-10-CM | POA: Diagnosis not present

## 2024-01-25 DIAGNOSIS — M3500 Sicca syndrome, unspecified: Secondary | ICD-10-CM

## 2024-01-25 DIAGNOSIS — E559 Vitamin D deficiency, unspecified: Secondary | ICD-10-CM

## 2024-01-25 DIAGNOSIS — F411 Generalized anxiety disorder: Secondary | ICD-10-CM

## 2024-01-25 DIAGNOSIS — E782 Mixed hyperlipidemia: Secondary | ICD-10-CM

## 2024-01-25 DIAGNOSIS — R768 Other specified abnormal immunological findings in serum: Secondary | ICD-10-CM

## 2024-01-25 DIAGNOSIS — M810 Age-related osteoporosis without current pathological fracture: Secondary | ICD-10-CM

## 2024-01-25 DIAGNOSIS — G479 Sleep disorder, unspecified: Secondary | ICD-10-CM

## 2024-01-28 ENCOUNTER — Ambulatory Visit

## 2024-01-28 ENCOUNTER — Ambulatory Visit: Payer: Medicare PPO | Admitting: Nurse Practitioner

## 2024-02-03 ENCOUNTER — Encounter (INDEPENDENT_AMBULATORY_CARE_PROVIDER_SITE_OTHER): Payer: Self-pay | Admitting: Nurse Practitioner

## 2024-02-03 ENCOUNTER — Ambulatory Visit (INDEPENDENT_AMBULATORY_CARE_PROVIDER_SITE_OTHER): Admitting: Nurse Practitioner

## 2024-02-03 VITALS — BP 116/73 | HR 56 | Temp 98.1°F | Ht 62.0 in | Wt 233.0 lb

## 2024-02-03 DIAGNOSIS — E66813 Obesity, class 3: Secondary | ICD-10-CM | POA: Diagnosis not present

## 2024-02-03 DIAGNOSIS — E559 Vitamin D deficiency, unspecified: Secondary | ICD-10-CM

## 2024-02-03 DIAGNOSIS — E782 Mixed hyperlipidemia: Secondary | ICD-10-CM | POA: Diagnosis not present

## 2024-02-03 DIAGNOSIS — R7303 Prediabetes: Secondary | ICD-10-CM | POA: Diagnosis not present

## 2024-02-03 DIAGNOSIS — R5383 Other fatigue: Secondary | ICD-10-CM

## 2024-02-03 DIAGNOSIS — I10 Essential (primary) hypertension: Secondary | ICD-10-CM

## 2024-02-03 DIAGNOSIS — Z6841 Body Mass Index (BMI) 40.0 and over, adult: Secondary | ICD-10-CM

## 2024-02-03 DIAGNOSIS — Z1331 Encounter for screening for depression: Secondary | ICD-10-CM

## 2024-02-03 DIAGNOSIS — T733XXA Exhaustion due to excessive exertion, initial encounter: Secondary | ICD-10-CM | POA: Insufficient documentation

## 2024-02-03 DIAGNOSIS — R0602 Shortness of breath: Secondary | ICD-10-CM | POA: Insufficient documentation

## 2024-02-03 NOTE — Progress Notes (Signed)
 [address] [place],  [place], [place] [address]  Office: [phone]  /  Fax: [phone]   Subjective   Initial Visit  Whitney Herrera (MR# [id number]) is a 68 y.o. female who presents for evaluation and treatment of obesity and related comorbidities. Current BMI is Body mass index is 42.62 kg/m. [name] has been struggling with her weight for many years and has been unsuccessful in either losing weight, maintaining weight loss, or reaching her healthy weight goal.  [name] is currently in the action stage of change and ready to dedicate time achieving and maintaining a healthier weight. [name] is interested in becoming our patient and working on intensive lifestyle modifications including (but not limited to) diet and exercise for weight loss.  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 Despite retiring six years ago, she continues to lead a sedentary lifestyle, primarily engaging in household activities without regular exercise. A recent trip to [place] involved extensive walking, which she found challenging due to her weight, prompting her to seek treatment. Her highest known weight is approximately 235 pounds. She does have pain in left heel, worse with walking.    Her medical history includes hyperlipidemia, hypertension, iron  deficiency anemia, vitamin D  and B12 deficiencies, osteoporosis, prediabetes, anxiety, and sleep disruption. She has asthma. No obstructive sleep apnea, heart disease, heart arrhythmias, lung disease, kidney disease, or polycystic ovary syndrome. Occasional reflux and snoring are reported, with potential sleep apnea, though she has not been tested.   She has a history of polyarthralgia with positive ANA and Sjogren's syndrome, managed with medication. Her arthritis symptoms are primarily in a couple of her fingers, and she denies any significant limitations in physical  activity due to arthritis.She has been dealing with left heel pain which has limited her activity.   She has tried various weight loss methods, including Weight Watchers and Noom, and is currently on topiramate  and phentermine  for weight control, which help maintain her weight but do not promote significant weight loss. She does not follow a specific nutrition plan and is not exercising regularly. She consumes processed foods and has difficulty with meal planning and prepping at this point in her life.  She is currently on Phentermine  8 mg every day and Topiramate  50 mg every day for weight loss but has not noticed weight loss with this medication.    In her family history, there is no known history of obesity. She wants to lose weight to improve her health and quality of life, without setting specific weight loss goals to avoid disappointment. She notes that her recent trip was a 'wake-up call' regarding her weight and health.   Weight history:  When asked how their weight has affected their life and health, she states: Has affected self-esteem, Contributed to medical problems, Contributed to orthopedic problems or mobility issues, Having fatigue, and Having poor endurance  When asked what else they would like to accomplish? She states: Adopt a healthier eating pattern and lifestyle, Improve energy levels and physical activity, Improve existing medical conditions, and Improve quality of life  She starting to note weight gain during : adulthood.  Life events associated with weight gain include : menopause.   Other contributing factors: consumption of processed foods, use of obesogenic medications: Beta-blockers, Psychotropic medications, and Anticholinergics, reduced physical activity, menopause, strong orexigenic signaling and/or inadequate inhibitory control , and multiple weight loss attempts in the past.  Their highest  weight has been:  235 lbs.  Desired weight: 195  Previous weight-loss  programs : Weight Watchers, Noom, and Tracking and Journaling.  Their maximum weight loss was:  20 lbs.  Their greatest challenge with dieting: difficulty maintaining reduced calorie state, meal preparation and cooking, and cravings.  Current or previous pharmacotherapy: Phentermine  and Topiramate .  Response to medication: Has not really noticed weight loss   Nutritional History:  Current nutrition plan: None.  How many times do you eat outside the home: 2-4 per week  How often do they skip meals: does not skip meals  What beverages do they drink: caffeinated beverages , juice, sweet tea , and alcohol. 2-3 glasses of wine/week  Use of artificial sweetners : No  Food intolerances or dislikes: olives and peas.  Food triggers: Stress, Boredom, When angry or upset, Seeking reward, and To help comfort self.  Food cravings: Sugary and Salty  Do they struggle with excessive hunger or portion control : Yes    Physical Activity:  Current level of physical activity: None  Barriers to Exercise: energy and foot pain, motivation   Past medical history includes:   Past Medical History:  Diagnosis Date   B12 deficiency    Hiatal hernia    Hyperlipidemia    Hypertension    Obesity    Prediabetes    Sjogren syndrome (HCC)    Vitamin D  deficiency      Objective   BP 116/73   Pulse (!) 56   Temp 98.1 F (36.7 C)   Ht 5' 2 (1.575 m)   Wt 233 lb (105.7 kg)   SpO2 93%   BMI 42.62 kg/m  She was weighed on the bioimpedance scale: Body mass index is 42.62 kg/m.    Anthropometrics:  Vitals Temp: 98.1 F (36.7 C) BP: 116/73 Pulse Rate: (!) 56 SpO2: 93 %   Anthropometric Measurements Height: 5' 2 (1.575 m) Weight: 233 lb (105.7 kg) BMI (Calculated): 42.61 Peak Weight: 235 lb Waist Measurement : 45 inches   Body Composition  Body Fat %: 52.4 % Fat Mass (lbs): 122.6 lbs Muscle Mass (lbs): 105.6 lbs Total Body Water (lbs): 85.2 lbs Visceral Fat Rating :  18   Other Clinical Data RMR: [id number] Fasting: yes Labs: yes Today's Visit #: 1 Starting Date: 02/03/24    Physical Exam:  General: She is overweight, cooperative, alert, well developed, and in no acute distress. PSYCH: Has normal mood, affect and thought process.   HEENT: EOMI, sclerae are anicteric. Lungs: Normal breathing effort, no conversational dyspnea. Extremities: No edema.  Neurologic: No gross sensory or motor deficits. No tremors or fasciculations noted.    Diagnostic Data Reviewed  EKG: Sinus bradycardia, rate 54. No conduction abnormalities, abnormal Q waves or chamber enlargement.  Indirect Calorimeter completed today shows a VO2 of 215 and a REE of 1483.  Her calculated basal metabolic rate is 8397 thus her resting energy expenditure slower than calculated.  Depression Screen  Tyonna's PHQ-9 score was: 6.     02/03/2024   10:09 AM  Depression screen PHQ 2/9  Decreased Interest 1  Down, Depressed, Hopeless 0  PHQ - 2 Score 1  Altered sleeping 0  Tired, decreased energy 2  Change in appetite 1  Feeling bad or failure about yourself  1  Trouble concentrating 1  Moving slowly or fidgety/restless 0  Suicidal thoughts 0  PHQ-9 Score 6    Screening for Sleep Related Breathing Disorders  Mallika admits to daytime somnolence and admits  to waking up still tired. Patient has a history of symptoms of daytime fatigue, morning fatigue, and hypertension. Lizmarie generally gets 8 hours of sleep per night, and states that she has generally restful sleep. Snoring is present. Apneic episodes are not present. Epworth Sleepiness Score is 13.   BMET    Component Value Date/Time   NA 141 10/12/2023 1243   NA 141 05/10/2013 1441   K 4.3 10/12/2023 1243   K 4.1 05/10/2013 1441   CL 104 10/12/2023 1243   CO2 30 10/12/2023 1243   CO2 24 05/10/2013 1441   GLUCOSE 84 10/12/2023 1243   GLUCOSE 87 05/10/2013 1441   BUN 20 10/12/2023 1243   BUN 15.0 05/10/2013 1441   CREATININE  0.76 10/12/2023 1243   CREATININE 0.79 04/20/2023 1042   CREATININE 0.8 05/10/2013 1441   CALCIUM  9.9 10/12/2023 1243   CALCIUM  9.9 05/10/2013 1441   GFRNONAA >60 02/15/2021 1116   GFRNONAA 90 10/01/2020 1503   GFRAA 104 10/01/2020 1503   Lab Results  Component Value Date   HGBA1C 6.0 10/12/2023   HGBA1C 5.9 (H) 04/20/2013   No results found for: INSULIN  CBC    Component Value Date/Time   WBC 5.8 10/12/2023 1243   RBC 4.82 10/12/2023 1243   HGB 13.7 10/12/2023 1243   HGB 12.2 02/15/2021 1116   HGB 13.7 05/10/2013 1441   HCT 42.9 10/12/2023 1243   HCT 41.4 05/10/2013 1441   PLT 209.0 10/12/2023 1243   PLT 226 02/15/2021 1116   PLT 187 05/10/2013 1441   MCV 89.1 10/12/2023 1243   MCV 89.3 05/10/2013 1441   MCH 26.7 (L) 04/20/2023 1042   MCHC 31.9 10/12/2023 1243   RDW 14.8 10/12/2023 1243   RDW 13.6 05/10/2013 1441   Iron /TIBC/Ferritin/ %Sat    Component Value Date/Time   IRON  97 10/12/2023 1613   TIBC 452.2 (H) 10/12/2023 1613   FERRITIN 15.4 10/12/2023 1613   IRONPCTSAT 21.5 10/12/2023 1613   IRONPCTSAT 18 09/22/2022 1141   Lipid Panel     Component Value Date/Time   CHOL 217 (H) 10/12/2023 1243   TRIG 237.0 (H) 10/12/2023 1243   HDL 49.30 10/12/2023 1243   CHOLHDL 4 10/12/2023 1243   VLDL 47.4 (H) 10/12/2023 1243   LDLCALC 120 (H) 10/12/2023 1243   LDLCALC 75 04/20/2023 1042   Hepatic Function Panel     Component Value Date/Time   PROT 6.7 10/12/2023 1243   PROT 7.6 05/10/2013 1441   ALBUMIN 4.2 10/12/2023 1243   ALBUMIN 4.1 05/10/2013 1441   AST 17 10/12/2023 1243   AST 15 02/15/2021 1116   AST 17 05/10/2013 1441   ALT 15 10/12/2023 1243   ALT 13 02/15/2021 1116   ALT 18 05/10/2013 1441   ALKPHOS 62 10/12/2023 1243   ALKPHOS 73 05/10/2013 1441   BILITOT 0.3 10/12/2023 1243   BILITOT 0.4 02/15/2021 1116   BILITOT 0.33 05/10/2013 1441   BILIDIR 0.1 12/06/2019 1112   IBILI 0.3 12/06/2019 1112      Component Value Date/Time   TSH 2.30  04/20/2023 1042     Assessment and Plan   TREATMENT PLAN FOR OBESITY: Class 3 severe Obesity with serious comorbidity and BMI of 40.0 to 44.9, unspecified obesity type Recommended Dietary Goals  Javeria is currently in the action stage of change. As such, her goal is to implement medically supervised obesity management plan.  She has agreed to implement: the Category 1 plan - 1000 kcal per day  Behavioral  Intervention  We discussed the following Behavioral Modification Strategies today: increasing lean protein intake to established goals, decreasing simple carbohydrates , increasing vegetables, increasing lower glycemic fruits, increasing fiber rich foods, avoiding skipping meals, increasing water intake, work on meal planning and preparation, reading food labels , keeping healthy foods at home, identifying sources and decreasing liquid calories, decreasing eating out or consumption of processed foods, and making healthy choices when eating convenient foods, planning for success, and better snacking choices  Additional resources provided today: Handout on healthy eating and balanced plate, Handout on complex carbohydrates and lean sources of protein, Personalized instruction on the use of artificial intelligence for recipes, tailored meal plans, calorie tracking, and finding healthier options when eating out. , Category 1 packet, and Handout principles of weight management  Recommended Physical Activity Goals  Melitza has been advised to work up to 150 minutes of moderate intensity aerobic activity a week and strengthening exercises 2-3 times per week for cardiovascular health, weight loss maintenance and preservation of muscle mass.   She has agreed to :  Think about enjoyable ways to increase daily physical activity and overcoming barriers to exercise and Increase physical activity in their day and reduce sedentary time (increase NEAT).  Medical Interventions and Pharmacotherapy We will  work on building a Therapist, art and behavioral strategies. We will discuss the role of pharmacotherapy as an adjunct at subsequent visits.   ASSOCIATED CONDITIONS ADDRESSED TODAY  Other Fatigue Kashae does feel that her weight is causing her energy to be lower than it should be. Fatigue may be related to obesity, depression or many other causes. Labs will be ordered, and in the meanwhile, Gennesis will focus on self care including making healthy food choices, increasing physical activity and focusing on stress reduction. - EKG 12 lead  Shortness of Breath Eller notes increasing shortness of breath with physical activity and seems to be worsening over time with weight gain. She notes getting out of breath sooner with activity than she used to. This has not gotten worse recently. Tanaisha denies shortness of breath at rest or orthopnea.  Depression screening   Prediabetes Currently on no medication Continue to limit simple carbs and start category 1 meal plan Continue to follow with PCP -     Insulin , random -     Basic metabolic panel with GFR  Hyperlipidemia, mixed       Continue Rosuvastatin  20 mg every day and limit saturated fats       Initiate category 1 meal plan       Continue to follow with PCP  Vitamin D  deficiency Continue Vit D3 5000 units daily, if still low 30's today will start Ergocalciferol  50000 units once a week instead for 12 weeks -     VITAMIN D  25 Hydroxy (Vit-D Deficiency, Fractures)  Essential hypertension       Continue Atenolol  25 mg every day       Monitor BP aand if consistently greater than 140/90 or develop headaches, chest pain or visual changes notify PCP       Continue to follow with PCP       Initiate category 1 meal plan   Follow-up  She was informed of the importance of frequent follow-up visits to maximize her success with intensive lifestyle modifications for her multiple health conditions. She was informed we would discuss  her lab results at her next visit unless there is a critical issue that needs to be addressed sooner. Anyae agreed to  keep her next visit at the agreed upon time to discuss these results.  Attestation Statement  This is the patient's intake visit at [facility]. The patient's Health Questionnaire was reviewed at length. Included in the packet: current and past health history, medications, allergies, ROS, gynecologic history (women only), surgical history, family history, social history, weight history, weight loss surgery history (for those that have had weight loss surgery), nutritional evaluation, mood and food questionnaire, PHQ9, Epworth questionnaire, sleep habits questionnaire, patient life and health improvement goals questionnaire. These will all be scanned into the patient's chart under media.   During the visit, I independently reviewed the patient's EKG, previous labs, bioimpedance scale results, and indirect calorimetry results. I used this information to medically tailor a meal plan for the patient that will help her to lose weight and will improve her obesity-related conditions. I performed a medically necessary appropriate examination and/or evaluation. I discussed the assessment and treatment plan with the patient. The patient was provided an opportunity to ask questions and all were answered. The patient agreed with the plan and demonstrated an understanding of the instructions. Labs were ordered at this visit and will be reviewed at the next visit unless critical results need to be addressed immediately. Clinical information was updated and documented in the EMR.   In addition, they received basic education on identification of processed foods and reduction of these, different sources of lean proteins and complex carbohydrates and how to eat balanced by incorporation of whole foods.  Reviewed by clinician on day of visit: allergies, medications, problem list, medical  history, surgical history, family history, social history, and previous encounter notes.  I have spent 47 minutes in the care of the patient today including: 7 minutes before the visit reviewing and preparing the chart. 32 minutes face-to-face assessing and reviewing listed medical problems as outlined in obesity care plan, providing nutritional and behavioral counseling on topics outlined in the obesity care plan, independently interpreting test results and goals of care, as described in assessment and plan, reviewing and discussing biometric information and progress, reviewing latest PCP notes and specialist consultations, and ordering diagnostics - see orders 8 minutes after the visit updating chart and documentation of encounter.       Benn Tarver ANP-C

## 2024-02-04 ENCOUNTER — Ambulatory Visit (INDEPENDENT_AMBULATORY_CARE_PROVIDER_SITE_OTHER): Payer: Self-pay | Admitting: Nurse Practitioner

## 2024-02-05 LAB — BASIC METABOLIC PANEL WITH GFR
BUN/Creatinine Ratio: 18 (ref 12–28)
BUN: 14 mg/dL (ref 8–27)
CO2: 23 mmol/L (ref 20–29)
Calcium: 9 mg/dL (ref 8.7–10.3)
Chloride: 104 mmol/L (ref 96–106)
Creatinine, Ser: 0.8 mg/dL (ref 0.57–1.00)
Glucose: 87 mg/dL (ref 70–99)
Potassium: 4.2 mmol/L (ref 3.5–5.2)
Sodium: 142 mmol/L (ref 134–144)
eGFR: 80 mL/min/1.73 (ref >59)

## 2024-02-05 LAB — INSULIN, RANDOM: INSULIN: 17.2 u[IU]/mL (ref 2.6–24.9)

## 2024-02-05 LAB — VITAMIN D 25 HYDROXY (VIT D DEFICIENCY, FRACTURES): Vit D, 25-Hydroxy: 49.8 ng/mL (ref 30.0–100.0)

## 2024-02-16 ENCOUNTER — Encounter: Payer: Self-pay | Admitting: Family

## 2024-02-16 DIAGNOSIS — F419 Anxiety disorder, unspecified: Secondary | ICD-10-CM

## 2024-02-16 DIAGNOSIS — G479 Sleep disorder, unspecified: Secondary | ICD-10-CM

## 2024-02-17 ENCOUNTER — Encounter (INDEPENDENT_AMBULATORY_CARE_PROVIDER_SITE_OTHER): Payer: Self-pay | Admitting: Nurse Practitioner

## 2024-02-17 ENCOUNTER — Ambulatory Visit (INDEPENDENT_AMBULATORY_CARE_PROVIDER_SITE_OTHER): Admitting: Nurse Practitioner

## 2024-02-17 ENCOUNTER — Other Ambulatory Visit: Payer: Self-pay | Admitting: Family

## 2024-02-17 VITALS — BP 118/72 | HR 52 | Temp 97.9°F | Ht 62.0 in | Wt 226.0 lb

## 2024-02-17 DIAGNOSIS — F419 Anxiety disorder, unspecified: Secondary | ICD-10-CM

## 2024-02-17 DIAGNOSIS — R7303 Prediabetes: Secondary | ICD-10-CM

## 2024-02-17 DIAGNOSIS — G479 Sleep disorder, unspecified: Secondary | ICD-10-CM

## 2024-02-17 DIAGNOSIS — E88819 Insulin resistance, unspecified: Secondary | ICD-10-CM

## 2024-02-17 DIAGNOSIS — E66813 Obesity, class 3: Secondary | ICD-10-CM | POA: Diagnosis not present

## 2024-02-17 DIAGNOSIS — E782 Mixed hyperlipidemia: Secondary | ICD-10-CM

## 2024-02-17 DIAGNOSIS — I10 Essential (primary) hypertension: Secondary | ICD-10-CM | POA: Diagnosis not present

## 2024-02-17 DIAGNOSIS — Z6841 Body Mass Index (BMI) 40.0 and over, adult: Secondary | ICD-10-CM | POA: Diagnosis not present

## 2024-02-17 DIAGNOSIS — E559 Vitamin D deficiency, unspecified: Secondary | ICD-10-CM | POA: Diagnosis not present

## 2024-02-17 MED ORDER — ESCITALOPRAM OXALATE 20 MG PO TABS: ORAL_TABLET | ORAL | 3 refills | Status: AC

## 2024-02-17 MED ORDER — TRAZODONE HCL 50 MG PO TABS: 25.0000 mg | ORAL_TABLET | Freq: Every evening | ORAL | 1 refills | Status: DC | PRN

## 2024-02-17 NOTE — Progress Notes (Signed)
 Office: [phone]  /  Fax: [phone]  WEIGHT SUMMARY AND BIOMETRICS  Weight Lost Since Last Visit: 7 lb  Weight Gained Since Last Visit: 0   Vitals Temp: 97.9 F (36.6 C) BP: 118/72 Pulse Rate: (!) 52 SpO2: 93 %   Anthropometric Measurements Height: 5' 2 (1.575 m) Weight: 226 lb (102.5 kg) BMI (Calculated): 41.33 Weight at Last Visit: 233 lb Weight Lost Since Last Visit: 7 lb Weight Gained Since Last Visit: 0 Peak Weight: 235 lb   Body Composition  Body Fat %: 50.2 % Fat Mass (lbs): 113.6 lbs Muscle Mass (lbs): 106.8 lbs Total Body Water (lbs): 79.8 lbs Visceral Fat Rating : 17   Other Clinical Data Fasting: no Labs: no Today's Visit #: 2 Starting Date: 02/17/24    Total Weight Loss: 7 pounds Percent of body weight lost: 3% BIO Impedence Data: Up 1.2 pounds of muscle, down 9 pounds of adipose  HPI  Chief Complaint: OBESITY  Whitney Herrera is here to discuss her progress with her obesity treatment plan. She is on the the Category 1 Plan and states she is following her eating plan approximately 80 % of the time. She states she has not started exercise yet due to foot pain. She previously was seen at [facility] and has an appointment in 2 weeks.    Interval History:  Since last office visit she been focusing on category 1 meal plan and has lost 7 pounds. She has been getting approximately 80 grams of protein. She skipped breakfast a couple of times. She is still working on drinking more water. She has not felt hunger but has had some cravings for sweets or crunchy/salty. She just returned from a 2 week trip river cruise [place].   She does have hypertension and BP is currently controlled with atenolol  25 mg every day BP Readings from Last 3 Encounters:  02/17/24 118/72  02/03/24 116/73  01/25/24 (!) 116/52   She does have Vit D deficiency which is currently adequately replaced with Vit D3 5000 units daily.  Lab results 5/25 did reveal  mixed hyperlipidemia and was started on Rosuvastatin  20 mg every day.  Cancer Screenings: Pap: 05/22/2015 DEXA: 12/28/23 MAMMO: 12/24/2023 Colonoscopy:  09/10/22  The 10-year ASCVD risk score (Arnett DK, et al., 2019) is: 9%- moderate risk   Values used to calculate the score:     Age: 68 years     Clincally relevant sex: Female     Is Non-Hispanic African American: No     Diabetic: No     Tobacco smoker: No     Systolic Blood Pressure: 116 mmHg     Is BP treated: Yes     HDL Cholesterol: 49.3 mg/dL     Total Cholesterol: 217 mg/dL  PHYSICAL EXAM:  Blood pressure 118/72, pulse (!) 52, temperature 97.9 F (36.6 C), height 5' 2 (1.575 m), weight 226 lb (102.5 kg), SpO2 93%. Body mass index is 41.34 kg/m.  General: Well Developed, well nourished, and in no acute distress.  HEENT: Normocephalic, atraumatic; EOMI, sclerae are anicteric. Skin: Warm and dry, good turgor Chest:  Normal excursion, shape, no gross ABN Respiratory: No conversational dyspnea; speaking in full sentences NeuroM-Sk:  Normal gross ROM * 4 extremities  Psych: A and O X 3, insight adequate, mood- full    DIAGNOSTIC DATA REVIEWED:  Last metabolic panel Lab Results  Component Value Date   GLUCOSE 87 02/03/2024   NA 142 02/03/2024   K 4.2 02/03/2024  CL 104 02/03/2024   CO2 23 02/03/2024   BUN 14 02/03/2024   CREATININE 0.80 02/03/2024   EGFR 80 02/03/2024   CALCIUM  9.0 02/03/2024   PROT 6.7 10/12/2023   ALBUMIN 4.2 10/12/2023   BILITOT 0.3 10/12/2023   ALKPHOS 62 10/12/2023   AST 17 10/12/2023   ALT 15 10/12/2023   ANIONGAP 9 02/15/2021  Liver and kidney functions are normal Glucose and electrolytes are normal  Lab Results  Component Value Date   HGBA1C 6.0 10/12/2023   HGBA1C 5.9 (H) 04/20/2013  A1c shows prediabetes- no current medication  Lab Results  Component Value Date   INSULIN  17.2 02/03/2024  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  Lab Results  Component Value Date   TSH  2.30 04/20/2023  TSH is in normal range  CBC    Component Value Date/Time   WBC 5.8 10/12/2023 1243   RBC 4.82 10/12/2023 1243   HGB 13.7 10/12/2023 1243   HGB 12.2 02/15/2021 1116   HGB 13.7 05/10/2013 1441   HCT 42.9 10/12/2023 1243   HCT 41.4 05/10/2013 1441   PLT 209.0 10/12/2023 1243   PLT 226 02/15/2021 1116   PLT 187 05/10/2013 1441   MCV 89.1 10/12/2023 1243   MCV 89.3 05/10/2013 1441   MCH 26.7 (L) 04/20/2023 1042   MCHC 31.9 10/12/2023 1243   RDW 14.8 10/12/2023 1243   RDW 13.6 05/10/2013 1441  Blood count is in normal range   Lipid Panel     Component Value Date/Time   CHOL 217 (H) 10/12/2023 1243   TRIG 237.0 (H) 10/12/2023 1243   HDL 49.30 10/12/2023 1243   CHOLHDL 4 10/12/2023 1243   VLDL 47.4 (H) 10/12/2023 1243   LDLCALC 120 (H) 10/12/2023 1243   LDLCALC 75 04/20/2023 1042  Lipid panel from previous visits- hyperlipidemia- currently on Rosuvastatin  20 mg every day   Nutritional Lab Results  Component Value Date   VD25OH 49.8 02/03/2024   VD25OH 38.86 10/12/2023   VD25OH 57 04/20/2023  Currently on cholecalciferol 5000 units daily- adequately replaced.  Lab Results  Component Value Date   VITAMINB12 1,532 (H) 07/07/2022  B12 is elevated- decrease supplementation to every other day  ASSESSMENT AND PLAN  TREATMENT PLAN FOR OBESITY: Class 3 severe obesity with serious comorbidity and body mass index (BMI) of 40.0 to 44.9 in adult, unspecified obesity type Recommended Dietary Goals  Whitney Herrera is currently in the action stage of change. As such, her goal is to continue weight management plan. She has agreed to the Category 1 Plan.  Behavioral Intervention  We discussed the following Behavioral Modification Strategies today: continue to work on maintaining a reduced calorie state, getting the recommended amount of protein, incorporating whole foods, making healthy choices, staying well hydrated and practicing mindfulness when eating. and increase  protein intake, fibrous foods (25 grams per day for women, 30 grams for men) and water to improve satiety and decrease hunger signals. .  Additional resources provided today: NA  Recommended Physical Activity Goals  Whitney Herrera has been advised to work up to 150 minutes of moderate intensity aerobic activity a week and strengthening exercises 2-3 times per week for cardiovascular health, weight loss maintenance and preservation of muscle mass.   She has agreed to Think about enjoyable ways to increase daily physical activity and overcoming barriers to exercise and Increase physical activity in their day and reduce sedentary time (increase NEAT). Start to explore chair yoga and chair aerobics classes on Youtube or the internet  Pharmacotherapy We discussed various medication options to help Whitney Herrera with her weight loss efforts and we both agreed to continue nutrition and behavior modification.  ASSOCIATED CONDITIONS ADDRESSED TODAY  Action/Plan  Prediabetes Insulin  resistance Continue Category 1 meal plan and limit simple carbohydrates Continue to focus on weight loss, losing 10-15% body weight can improve glucose levels and insulin  resistance  Hyperlipidemia, mixed Continue Rosuvastatin  20 mg every day Continue Category 1 meal plan and limit saturated fats Losing 10-15% body weight can improve lipids  Vitamin D  deficiency Continue Vit D3 5000 units daily- Vit D is at goal  Essential hypertension Continue atenolol  25 mg every day Monitor BP daily and if consistently > 140/90 notify PCP           Return in about 3 weeks (around 03/09/2024).SABRA She was informed of the importance of frequent follow up visits to maximize her success with intensive lifestyle modifications for her multiple health conditions.   ATTESTASTION STATEMENTS:  Reviewed by clinician on day of visit: allergies, medications, problem list, medical history, surgical history, family history, social history, and  previous encounter notes.   I personally spent a total of 48 minutes in the care of the patient today including preparing to see the patient, getting/reviewing separately obtained history, performing a medically appropriate exam/evaluation, counseling and educating, documenting clinical information in the EHR, independently interpreting results, communicating results, and coordinating care.   Caralee Morea ANP-C

## 2024-02-22 ENCOUNTER — Encounter: Payer: Self-pay | Admitting: Podiatry

## 2024-02-22 ENCOUNTER — Ambulatory Visit (INDEPENDENT_AMBULATORY_CARE_PROVIDER_SITE_OTHER)

## 2024-02-22 ENCOUNTER — Ambulatory Visit: Admitting: Podiatry

## 2024-02-22 DIAGNOSIS — M7672 Peroneal tendinitis, left leg: Secondary | ICD-10-CM | POA: Diagnosis not present

## 2024-02-22 DIAGNOSIS — M779 Enthesopathy, unspecified: Secondary | ICD-10-CM

## 2024-02-22 DIAGNOSIS — M7752 Other enthesopathy of left foot: Secondary | ICD-10-CM

## 2024-02-22 MED ORDER — MELOXICAM 15 MG PO TABS: 15.0000 mg | ORAL_TABLET | Freq: Every day | ORAL | 0 refills | Status: AC

## 2024-02-22 NOTE — Patient Instructions (Signed)
 Peroneal Tendinopathy Rehab Ask your health care provider which exercises are safe for you. Do exercises exactly as told by your health care provider and adjust them as directed. It is normal to feel mild stretching, pulling, tightness, or discomfort as you do these exercises. Stop right away if you feel sudden pain or your pain gets worse. Do not begin these exercises until told by your health care provider. Stretching and range-of-motion exercises These exercises warm up your muscles and joints. They can help improve the movement and flexibility of your ankle. They may also help to relieve pain and stiffness. Gastrocnemius and soleus stretch, standing This is an exercise in which you stand on a step and use your body weight to stretch your calf muscles. To do this exercise: Stand on the edge of a step on the ball of your left / right foot. The ball of your foot is on the walking surface, right under your toes. Keep your other foot firmly on the same step. Hold on to the wall, a railing, or a chair for balance. Slowly lift your other foot, allowing your body weight to press your left / right heel down over the edge of the step. You should feel a stretch in your left / right calf (gastrocnemius and soleus). Hold this position for __________ seconds. Return both feet to the step. Repeat this exercise with a slight bend in your left / right knee. Repeat __________ times with your left / right knee straight and __________ times with your left / right knee bent. Complete this exercise __________ times a day. Strengthening exercises These exercises build strength and endurance in your foot and ankle. Endurance is the ability to use your muscles for a long time, even after they get tired. Ankle dorsiflexion with band  Secure a rubber exercise band or tube to an object, such as a table leg, that will not move when the band is pulled. Secure the other end of the band around your left / right foot. Sit on  the floor. Face the object with your left / right leg extended. The band or tube should be slightly tense when your foot is relaxed. Slowly flex your left / right ankle and toes to bring your foot toward you (dorsiflexion). Hold this position for __________ seconds. Let the band or tube slowly pull your foot back to the starting position. Repeat __________ times. Complete this exercise __________ times a day. Ankle eversion  Sit on the floor with your legs straight out in front of you. Loop a rubber exercise band or tube around the ball of your left / right foot. The ball of your foot is on the walking surface, right under your toes. Hold the ends of the band in your hands. You can also secure the band to a stable object. The band or tube should be slightly tense when your foot is relaxed. Slowly push your foot outward, away from your other leg (eversion). Hold this position for __________ seconds. Slowly return your foot to the starting position. Repeat __________ times. Complete this exercise __________ times a day. Plantar flexion, standing This exercise is sometimes called a standing heel raise. Stand with your feet shoulder-width apart. Place your hands on a wall or table to steady yourself as needed. Try not to use it for support. Keep your weight spread evenly over the width of your feet while you slowly rise up on your toes (plantar flexion). If told by your health care provider: Shift your weight  toward your left / right leg until you feel challenged. Stand on your left / right leg only. Hold this position for __________ seconds. Repeat __________ times. Complete this exercise __________ times a day. Single leg stand  Without shoes, stand near a railing or in a doorway. You may hold on to the railing or doorframe as needed. Stand on your left / right foot. Keep your big toe down on the floor and try to keep your arch lifted. Do not roll to the outside of your foot. If this  exercise is too easy, you can try it with your eyes closed or while standing on a pillow. Hold this position for __________ seconds. Repeat __________ times. Complete this exercise __________ times a day. This information is not intended to replace advice given to you by your health care provider. Make sure you discuss any questions you have with your health care provider. Document Revised: 09/12/2021 Document Reviewed: 09/12/2021 Elsevier Patient Education  2024 [employer].

## 2024-02-22 NOTE — Progress Notes (Signed)
  Subjective:  Patient ID: Whitney Herrera, female    DOB: 23-Jul-1955,   MRN: [id number]  Chief Complaint  Patient presents with   Foot Pain    I have pain in my heel, ankle and big toe on my left foot.    68 y.o. female presents for concern of left foot pain that has been ongoing for a couple months started in July and worsened after a trip to [place]. She has tried an anklet with mild releif and taking anti-inflammatories minimally. Denies injury denies other treatments.   . Denies any other pedal complaints. Denies n/v/f/c.   Past Medical History:  Diagnosis Date   Anemia    B12 deficiency    Heart burn    Hiatal hernia    Hyperlipidemia    Hypertension    Joint pain    Obesity    Palpitations    Prediabetes    Sjogren syndrome (HCC)    Vitamin D  deficiency     Objective:  Physical Exam: Vascular: DP/PT pulses 2/4 bilateral. CFT <3 seconds. Normal hair growth on digits. No edema.  Skin. No lacerations or abrasions bilateral feet.  Musculoskeletal: MMT 5/5 bilateral lower extremities in DF, PF, Inversion and Eversion. Deceased ROM in DF of ankle joint. Tender along peroneal tendon course from lateral malleolus to distal insertion and pain to lateral plantar heel lateral band of plantar fassia. Pain with eversion of the foot  Neurological: Sensation intact to light touch.   Assessment:   1. Peroneal tendonitis, left      Plan:  Patient was evaluated and treated and all questions answered. X-rays reviewed and discussed with patient. No acute fractures or dislocaitions noted.  Discussed peroneal tendinitis and treatment options at length with patient Discussed stretching exercises and provided handout. Prescription for meloxicam  provided Dispensed Tri-Lock ankle brace. Discussed that if the symptoms do not improve can consider PT/MRI. Patient to return in 6 to 8 weeks or sooner if symptoms fail to improve or worsen.   Asberry Failing, DPM

## 2024-03-07 ENCOUNTER — Ambulatory Visit: Admitting: Podiatry

## 2024-03-09 ENCOUNTER — Ambulatory Visit (INDEPENDENT_AMBULATORY_CARE_PROVIDER_SITE_OTHER): Admitting: Nurse Practitioner

## 2024-03-09 ENCOUNTER — Encounter (INDEPENDENT_AMBULATORY_CARE_PROVIDER_SITE_OTHER): Payer: Self-pay | Admitting: Nurse Practitioner

## 2024-03-09 VITALS — BP 130/74 | HR 57 | Temp 98.3°F | Ht 62.0 in | Wt 224.0 lb

## 2024-03-09 DIAGNOSIS — I10 Essential (primary) hypertension: Secondary | ICD-10-CM

## 2024-03-09 DIAGNOSIS — E88819 Insulin resistance, unspecified: Secondary | ICD-10-CM

## 2024-03-09 DIAGNOSIS — E66813 Obesity, class 3: Secondary | ICD-10-CM

## 2024-03-09 DIAGNOSIS — E559 Vitamin D deficiency, unspecified: Secondary | ICD-10-CM

## 2024-03-09 DIAGNOSIS — Z6841 Body Mass Index (BMI) 40.0 and over, adult: Secondary | ICD-10-CM

## 2024-03-09 DIAGNOSIS — R7303 Prediabetes: Secondary | ICD-10-CM

## 2024-03-09 NOTE — Progress Notes (Signed)
 Office: [phone]  /  Fax: [phone]  WEIGHT SUMMARY AND BIOMETRICS  Weight Lost Since Last Visit: 2 lb  Weight Gained Since Last Visit: 0   Vitals Temp: 98.3 F (36.8 C) BP: 130/74 Pulse Rate: (!) 57 SpO2: 95 %   Anthropometric Measurements Height: 5' 2 (1.575 m) Weight: 224 lb (101.6 kg) BMI (Calculated): 40.96 Weight at Last Visit: 226 lb Weight Lost Since Last Visit: 2 lb Weight Gained Since Last Visit: 0 Starting Weight: 233 lb Total Weight Loss (lbs): 9 lb (4.082 kg) Peak Weight: 235 lb   Body Composition  Body Fat %: 50.1 % Fat Mass (lbs): 112.4 lbs Muscle Mass (lbs): 106.2 lbs Total Body Water (lbs): 80 lbs Visceral Fat Rating : 17   Other Clinical Data Fasting: no Labs: no Today's Visit #: 3 Starting Date: 02/17/24    Total Weight Loss: 9 pounds Percent of body weight lost: 3.8%   Bio Impedance Data reviewed with patient: Down 0.6 pounds muscle, down 1.2 pounds adipose  HPI  Chief Complaint: OBESITY  Whitney Herrera is here to discuss her progress with her obesity treatment plan. She is on the the Category 1 Plan and states she is following her eating plan approximately 50-75 % of the time. She states she is not exercising   Interval History:  Since last office visit she has been diagnosed with left foot peroneal tendonitis and is currently doing stretching exercises, ankle brace and Meloxicam  15 mg every day as needed. She is noticing improvement in her foot pain. She has been doing more NEAT time. She does feel she is getting 80 grams of protein most days. She will skip breakfast once in a while. She drinks 2-3 glasses of 12 ounce water/day. She has been logging her food on tracker and will go to close to 1100 calories. She is not going long times between eating.  She does eat out 2-3 times/week.  She will start chair exercises 2-3 times a week. She has no upcoming trips.  She continues on Phentermine  8 mg every day and topamax  50 mg at  bedtime.   Lempi does have hypertension which is currently well controlled with Atenolol  25 mg every day . Denies headaches, chest pain, shortness of breath and dizziness BP Readings from Last 3 Encounters:  03/09/24 130/74  02/17/24 118/72  02/03/24 116/73    She does have prediabetes and insulin  resistance and is currently on no medication for treatment. She is using nutrition and weight loss to help improve. She continues on cholecalciferol 5000 units every day for Vit D deficiency and is well controlled.  Last vitamin D  Lab Results  Component Value Date   VD25OH 49.8 02/03/2024       PHYSICAL EXAM:  Blood pressure 130/74, pulse (!) 57, temperature 98.3 F (36.8 C), height 5' 2 (1.575 m), weight 224 lb (101.6 kg), SpO2 95%. Body mass index is 40.97 kg/m.  General: Well Developed, well nourished, and in no acute distress.  HEENT: Normocephalic, atraumatic; EOMI, sclerae are anicteric. Skin: Warm and dry, good turgor Chest:  Normal excursion, shape, no gross ABN Respiratory: No conversational dyspnea; speaking in full sentences NeuroM-Sk:  Normal gross ROM * 4 extremities  Psych: A and O X 3, insight adequate, mood- full    DIAGNOSTIC DATA REVIEWED:  BMET    Component Value Date/Time   NA 142 02/03/2024 1058   NA 141 05/10/2013 1441   K 4.2 02/03/2024 1058   K 4.1 05/10/2013 1441   CL 104  02/03/2024 1058   CO2 23 02/03/2024 1058   CO2 24 05/10/2013 1441   GLUCOSE 87 02/03/2024 1058   GLUCOSE 84 10/12/2023 1243   GLUCOSE 87 05/10/2013 1441   BUN 14 02/03/2024 1058   BUN 15.0 05/10/2013 1441   CREATININE 0.80 02/03/2024 1058   CREATININE 0.79 04/20/2023 1042   CREATININE 0.8 05/10/2013 1441   CALCIUM  9.0 02/03/2024 1058   CALCIUM  9.9 05/10/2013 1441   GFRNONAA >60 02/15/2021 1116   GFRNONAA 90 10/01/2020 1503   GFRAA 104 10/01/2020 1503   Lab Results  Component Value Date   HGBA1C 6.0 10/12/2023   HGBA1C 5.9 (H) 04/20/2013   Lab Results  Component  Value Date   INSULIN  17.2 02/03/2024   Lab Results  Component Value Date   TSH 2.30 04/20/2023   CBC    Component Value Date/Time   WBC 5.8 10/12/2023 1243   RBC 4.82 10/12/2023 1243   HGB 13.7 10/12/2023 1243   HGB 12.2 02/15/2021 1116   HGB 13.7 05/10/2013 1441   HCT 42.9 10/12/2023 1243   HCT 41.4 05/10/2013 1441   PLT 209.0 10/12/2023 1243   PLT 226 02/15/2021 1116   PLT 187 05/10/2013 1441   MCV 89.1 10/12/2023 1243   MCV 89.3 05/10/2013 1441   MCH 26.7 (L) 04/20/2023 1042   MCHC 31.9 10/12/2023 1243   RDW 14.8 10/12/2023 1243   RDW 13.6 05/10/2013 1441   Iron  Studies    Component Value Date/Time   IRON  97 10/12/2023 1613   TIBC 452.2 (H) 10/12/2023 1613   FERRITIN 15.4 10/12/2023 1613   IRONPCTSAT 21.5 10/12/2023 1613   IRONPCTSAT 18 09/22/2022 1141   Lipid Panel     Component Value Date/Time   CHOL 217 (H) 10/12/2023 1243   TRIG 237.0 (H) 10/12/2023 1243   HDL 49.30 10/12/2023 1243   CHOLHDL 4 10/12/2023 1243   VLDL 47.4 (H) 10/12/2023 1243   LDLCALC 120 (H) 10/12/2023 1243   LDLCALC 75 04/20/2023 1042   Hepatic Function Panel     Component Value Date/Time   PROT 6.7 10/12/2023 1243   PROT 7.6 05/10/2013 1441   ALBUMIN 4.2 10/12/2023 1243   ALBUMIN 4.1 05/10/2013 1441   AST 17 10/12/2023 1243   AST 15 02/15/2021 1116   AST 17 05/10/2013 1441   ALT 15 10/12/2023 1243   ALT 13 02/15/2021 1116   ALT 18 05/10/2013 1441   ALKPHOS 62 10/12/2023 1243   ALKPHOS 73 05/10/2013 1441   BILITOT 0.3 10/12/2023 1243   BILITOT 0.4 02/15/2021 1116   BILITOT 0.33 05/10/2013 1441   BILIDIR 0.1 12/06/2019 1112   IBILI 0.3 12/06/2019 1112      Component Value Date/Time   TSH 2.30 04/20/2023 1042   Nutritional Lab Results  Component Value Date   VD25OH 49.8 02/03/2024   VD25OH 38.86 10/12/2023   VD25OH 57 04/20/2023     ASSESSMENT AND PLAN  Class 3 severe obesity with serious comorbidity and body mass index (BMI) of 40.0 to 44.9 in adult,  unspecified obesity type (HCC) TREATMENT PLAN FOR OBESITY:  Recommended Dietary Goals  Tyisha is currently in the action stage of change. As such, her goal is to continue weight management plan. She has agreed to the Category 1 Plan.  Behavioral Intervention  We discussed the following Behavioral Modification Strategies today: increasing lean protein intake to established goals, increasing fiber rich foods, avoiding skipping meals, increasing water intake , work on tracking and journaling calories using tracking application,  and better snacking choices.  Additional resources provided today: skinnytaste website reviewed for new recipes  Recommended Physical Activity Goals  Aubreanna has been advised to work up to 150 minutes of moderate intensity aerobic activity a week and strengthening exercises 2-3 times per week for cardiovascular health, weight loss maintenance and preservation of muscle mass.   She has agreed to Think about enjoyable ways to increase daily physical activity and overcoming barriers to exercise, Increase physical activity in their day and reduce sedentary time (increase NEAT)., and Start chair exercises with initial goal of doing 2-3 times a week.    Pharmacotherapy We discussed various medication options to help Colletta with her weight loss efforts and we both agreed to continue on Phentermine  8 mg every day and Topamax  50 mg at bedtime which is prescribed through PCP. Denies side effects.  ASSOCIATED CONDITIONS ADDRESSED TODAY  Action/Plan  Prediabetes Insulin  resistance Continue Category 1 meal plan, limit simple carbohydrates, increase protein Decreasing body weight by 10-15% can improve glucose levels Start chair exercises 2-3 times a week   Essential hypertension Continue atenolol  25 mg every day Continue Category 1 meal plan  and DASH diet Monitor BP and if consistently >140/90 notify PCP If develops headaches, chest pain, shortness of breath or dizziness go  to ER Loss of 10-15% body weight can help improve blood pressures  Start chair exercises 2-3 times a week  Vitamin D  deficiency       Continue cholecalciferol 5000 units daily, denies side effects.      Return in about 4 weeks (around 04/06/2024).SABRA She was informed of the importance of frequent follow up visits to maximize her success with intensive lifestyle modifications for her multiple health conditions.   ATTESTASTION STATEMENTS:  Reviewed by clinician on day of visit: allergies, medications, problem list, medical history, surgical history, family history, social history, and previous encounter notes.   I personally spent a total of 34 minutes in the care of the patient today including preparing to see the patient, getting/reviewing separately obtained history, performing a medically appropriate exam/evaluation, counseling and educating, referring and communicating with other health care professionals, documenting clinical information in the EHR, and coordinating care.   Collan Schoenfeld ANP-C

## 2024-03-29 ENCOUNTER — Ambulatory Visit (INDEPENDENT_AMBULATORY_CARE_PROVIDER_SITE_OTHER)

## 2024-03-29 VITALS — Ht 62.0 in | Wt 224.0 lb

## 2024-03-29 DIAGNOSIS — Z0000 Encounter for general adult medical examination without abnormal findings: Secondary | ICD-10-CM | POA: Diagnosis not present

## 2024-03-29 NOTE — Patient Instructions (Addendum)
 Whitney Herrera,  Thank you for taking the time for your Medicare Wellness Visit. I appreciate your continued commitment to your health goals. Please review the care plan we discussed, and feel free to reach out if I can assist you further.  Medicare recommends these wellness visits once per year to help you and your care team stay ahead of potential health issues. These visits are designed to focus on prevention, allowing your provider to concentrate on managing your acute and chronic conditions during your regular appointments.  Please note that Annual Wellness Visits do not include a physical exam. Some assessments may be limited, especially if the visit was conducted virtually. If needed, we may recommend a separate in-person follow-up with your provider.  Ongoing Care Seeing your primary care provider every 3 to 6 months helps us  monitor your health and provide consistent, personalized care.   Referrals If a referral was made during today's visit and you haven't received any updates within two weeks, please contact the referred provider directly to check on the status.  Recommended Screenings:  Health Maintenance  Topic Date Due   DEXA scan (bone density measurement)  06/24/2020   Breast Cancer Screening  10/25/2023   COVID-19 Vaccine (4 - 2025-26 season) 02/01/2024   Medicare Annual Wellness Visit  03/29/2025   DTaP/Tdap/Td vaccine (3 - Td or Tdap) 06/19/2027   Colon Cancer Screening  09/09/2029   Pneumococcal Vaccine for age over 83  Completed   Flu Shot  Completed   Hepatitis C Screening  Completed   Zoster (Shingles) Vaccine  Completed   Meningitis B Vaccine  Aged Out       03/29/2024   10:10 AM  Advanced Directives  Does Patient Have a Medical Advance Directive? Yes  Type of Advance Directive Living will;Healthcare Power of Attorney  Copy of Healthcare Power of Attorney in Chart? No - copy requested   Advance Care Planning is important because it: Ensures you receive  medical care that aligns with your values, goals, and preferences. Provides guidance to your family and loved ones, reducing the emotional burden of decision-making during critical moments.  Vision: Annual vision screenings are recommended for early detection of glaucoma, cataracts, and diabetic retinopathy. These exams can also reveal signs of chronic conditions such as diabetes and high blood pressure.  Dental: Annual dental screenings help detect early signs of oral cancer, gum disease, and other conditions linked to overall health, including heart disease and diabetes.

## 2024-03-29 NOTE — Progress Notes (Signed)
 Subjective:   Whitney Herrera is a 68 y.o. who presents for a Medicare Wellness preventive visit.  As a reminder, Annual Wellness Visits don't include a physical exam, and some assessments may be limited, especially if this visit is performed virtually. We may recommend an in-person follow-up visit with your provider if needed.  Visit Complete: Virtual I connected with  [name] on 03/29/24 by a video and audio enabled telemedicine application and verified that I am speaking with the correct person using two identifiers.  Patient Location: Home  Provider Location: Office/Clinic  I discussed the limitations of evaluation and management by telemedicine. The patient expressed understanding and agreed to proceed.  Vital Signs: Because this visit was a virtual/telehealth visit, some criteria may be missing or patient reported. Any vitals not documented were not able to be obtained and vitals that have been documented are patient reported.  Persons Participating in Visit: Patient.  AWV Questionnaire: Yes: Patient Medicare AWV questionnaire was completed by the patient on 03/25/24; I have confirmed that all information answered by patient is correct and no changes since this date.  Cardiac Risk Factors include: advanced age (>32men, >64 women);dyslipidemia;hypertension;obesity (BMI >30kg/m2);sedentary lifestyle     Objective:    Today's Vitals   03/29/24 1005  Weight: 224 lb (101.6 kg)  Height: 5' 2 (1.575 m)   Body mass index is 40.97 kg/m.     03/29/2024   10:10 AM 03/12/2022    2:31 PM 02/22/2021   11:25 AM 02/09/2020   11:15 AM 11/23/2019   10:04 AM 11/09/2019    1:59 PM  Advanced Directives  Does Patient Have a Medical Advance Directive? Yes Yes Yes Yes Yes Yes  Type of Advance Directive Living will;Healthcare Power of Attorney Living will Healthcare Power of [place];Living will Healthcare Power of [employer] Power of [employer] Power of Attorney  Does  patient want to make changes to medical advance directive?   No - Patient declined  No - Patient declined No - Patient declined  Copy of Healthcare Power of Attorney in Chart? No - copy requested  No - copy requested No - copy requested No - copy requested No - copy requested    Current Medications (verified) Outpatient Encounter Medications as of 03/29/2024  Medication Sig   albuterol  (VENTOLIN  HFA) 108 (90 Base) MCG/ACT inhaler Inhale 2 puffs into the lungs every 6 (six) hours as needed for wheezing or shortness of breath.   atenolol  (TENORMIN ) 25 MG tablet Take 1 tablet (25 mg total) by mouth daily.   cetirizine (ZYRTEC) 10 MG chewable tablet Chew 10 mg by mouth daily.   Cholecalciferol (VITAMIN D  PO) Take 5,000 Units by mouth daily.    Cyanocobalamin  (B-12 PO) Take by mouth.   escitalopram  (LEXAPRO ) 20 MG tablet Take  1 tablet  Daily  for Mood   fluticasone -salmeterol (ADVAIR) 250-50 MCG/ACT AEPB Use 1 Inhalation 2 x /day ( every 12 hours) for Allergic Asthma   lactobacillus acidophilus (BACID) TABS tablet Take 1 tablet by mouth daily.   meloxicam  (MOBIC ) 15 MG tablet Take 1 tablet (15 mg total) by mouth daily.   Multiple Vitamins-Minerals (ONE DAILY MULTIVITAMIN WOMEN PO) Take 1 tablet by mouth daily. With iron  and B vitamins   Phentermine  HCl 8 MG TABS Take 1 tablet (8 mg total) by mouth daily.   rosuvastatin  (CRESTOR ) 20 MG tablet Take 1 tablet (20 mg total) by mouth daily.   topiramate  (TOPAMAX ) 50 MG tablet Take one tablet once  daily   traZODone  (DESYREL ) 50 MG tablet Take 0.5-1 tablets (25-50 mg total) by mouth at bedtime as needed for sleep.   No facility-administered encounter medications on file as of 03/29/2024.    Allergies (verified) Lipitor [atorvastatin] and Wellbutrin [bupropion]   History: Past Medical History:  Diagnosis Date   Anemia    Arthritis    Asthma    B12 deficiency    GERD (gastroesophageal reflux disease)    Heart burn    Heart murmur    Hiatal  hernia    Hyperlipidemia    Hypertension    Joint pain    Obesity    Palpitations    Prediabetes    Sjogren syndrome    Vitamin D  deficiency    Past Surgical History:  Procedure Laterality Date   APPENDECTOMY     BREAST BIOPSY Right 06/02/1973   COLONOSCOPY     INGUINAL HERNIA REPAIR Right age 10   UPPER GASTROINTESTINAL ENDOSCOPY     Family History  Problem Relation Age of Onset   Hypertension Mother    Alzheimer's disease Mother    Varicose Veins Mother    Hypertension Father    Alzheimer's disease Father    Healthy Sister    ADD / ADHD Son    ADD / ADHD Daughter    Learning disabilities Sister    Colon cancer Neg Hx    Rectal cancer Neg Hx    Stomach cancer Neg Hx    Esophageal cancer Neg Hx    Social History   Socioeconomic History   Marital status: Married    Spouse name: Sharry Beining   Number of children: 2   Years of education: Not on file   Highest education level: Bachelor's degree (e.g., BA, AB, BS)  Occupational History   Occupation: retired  Tobacco Use   Smoking status: Never   Smokeless tobacco: Never  Vaping Use   Vaping status: Never Used  Substance and Sexual Activity   Alcohol use: Yes    Alcohol/week: 2.0 standard drinks of alcohol    Types: 2 Glasses of wine per week    Comment: wine occasionally   Drug use: No   Sexual activity: Not Currently    Partners: Male    Birth control/protection: Post-menopausal  Other Topics Concern   Not on file  Social History Narrative   Not on file   Social Drivers of Health   Financial Resource Strain: Low Risk  (03/25/2024)   Overall Financial Resource Strain (CARDIA)    Difficulty of Paying Living Expenses: Not hard at all  Food Insecurity: No Food Insecurity (03/25/2024)   Hunger Vital Sign    Worried About Running Out of Food in the Last Year: Never true    Ran Out of Food in the Last Year: Never true  Transportation Needs: No Transportation Needs (03/25/2024)   PRAPARE - Therapist, Art (Medical): No    Lack of Transportation (Non-Medical): No  Physical Activity: Unknown (03/25/2024)   Exercise Vital Sign    Days of Exercise per Week: Patient declined    Minutes of Exercise per Session: Not on file  Stress: No Stress Concern Present (03/25/2024)   [employer] of Occupational Health - Occupational Stress Questionnaire    Feeling of Stress: Not at all  Social Connections: Socially Integrated (03/25/2024)   Social Connection and Isolation Panel    Frequency of Communication with Friends and Family: More than three times a week  Frequency of Social Gatherings with Friends and Family: Three times a week    Attends Religious Services: More than 4 times per year    Active Member of Clubs or Organizations: Yes    Attends Engineer, Structural: More than 4 times per year    Marital Status: Married    Tobacco Counseling Counseling given: Not Answered    Clinical Intake:  Pre-visit preparation completed: Yes  Pain : No/denies pain     BMI - recorded: 40.97 Nutritional Status: BMI > 30  Obese Nutritional Risks: None Diabetes: No  Lab Results  Component Value Date   HGBA1C 6.0 10/12/2023   HGBA1C 5.9 (H) 04/20/2023   HGBA1C 5.8 (H) 01/15/2023     How often do you need to have someone help you when you read instructions, pamphlets, or other written materials from your doctor or pharmacy?: 1 - Never  Interpreter Needed?: No  Comments: lives with husband Information entered by :: B.Jheremy Boger,LPN   Activities of Daily Living     03/25/2024    2:26 PM 04/19/2023    7:19 PM  In your present state of health, do you have any difficulty performing the following activities:  Hearing? 0 0  Vision? 0 0  Difficulty concentrating or making decisions? 0 0  Walking or climbing stairs? 0 0  Dressing or bathing? 0 0  Doing errands, shopping? 0 0  Preparing Food and eating ? N   Using the Toilet? N   In the past six months,  have you accidently leaked urine? N   Do you have problems with loss of bowel control? N   Managing your Medications? N   Managing your Finances? N   Housekeeping or managing your Housekeeping? N     Patient Care Team: Corwin Antu, FNP as PCP - General (Family Medicine) Leva Rush, MD as Consulting Physician (Obstetrics and Gynecology) Madelyn Deanne BRAVO, OD (Optometry)  I have updated your Care Teams any recent Medical Services you may have received from other providers in the past year.     Assessment:   This is a routine wellness examination for [employer].  Hearing/Vision screen Hearing Screening - Comments:: Patient denies any hearing difficulties.   Vision Screening - Comments:: Pt says their vision is good with glasses Dr  Deanne Madelyn   Goals Addressed             This Visit's Progress    DIET - INCREASE WATER INTAKE   Not on track    03/29/24-65+ fluid ounces of water or unsweet/clear liquids     Exercise 20-30 min daily (brisk walking)   Not on track    03/29/24-once her foot gets better from tendonitis       Depression Screen     03/29/2024   10:08 AM 02/03/2024   10:09 AM 10/12/2023   12:42 PM 04/19/2023    7:17 PM 01/15/2023   11:35 AM 10/13/2022   10:21 PM 06/30/2022   11:37 PM  PHQ 2/9 Scores  PHQ - 2 Score 0 1 0 0 0 0 0  PHQ- 9 Score  6 7  0      Fall Risk     03/25/2024    2:26 PM 10/12/2023   11:53 AM 04/19/2023    7:17 PM 01/15/2023   11:35 AM 10/13/2022   10:20 PM  Fall Risk   Falls in the past year? 0 0 0 0 0  Number falls in past yr: 0 0  Injury with Fall? 0 0     Risk for fall due to : No Fall Risks No Fall Risks No Fall Risks  No Fall Risks  Follow up Falls prevention discussed;Education provided Falls evaluation completed Falls prevention discussed;Education provided;Falls evaluation completed  Falls prevention discussed;Education provided;Falls evaluation completed    MEDICARE RISK AT HOME:  Medicare Risk at Home Any stairs in  or around the home?: (Patient-Rptd) No Home free of loose throw rugs in walkways, pet beds, electrical cords, etc?: (Patient-Rptd) Yes Adequate lighting in your home to reduce risk of falls?: (Patient-Rptd) Yes Life alert?: (Patient-Rptd) No Use of a cane, walker or w/c?: (Patient-Rptd) No Grab bars in the bathroom?: (Patient-Rptd) Yes Shower chair or bench in shower?: (Patient-Rptd) No Elevated toilet seat or a handicapped toilet?: (Patient-Rptd) Yes  TIMED UP AND GO:  Was the test performed?  No  Cognitive Function: 6CIT completed        03/29/2024   10:12 AM  6CIT Screen  What Year? 0 points  What month? 0 points  What time? 0 points  Count back from 20 0 points  Months in reverse 0 points  Repeat phrase 0 points  Total Score 0 points    Immunizations Immunization History  Administered Date(s) Administered   INFLUENZA, HIGH DOSE SEASONAL PF 02/21/2021, 03/12/2022, 04/01/2023   Influenza Inj Mdck Quad Pf 03/09/2019   Influenza-Unspecified 04/28/2015, 02/26/2016, 03/09/2019, 03/09/2024   PFIZER(Purple Top)SARS-COV-2 Vaccination 08/10/2019, 09/07/2019, 12/22/2020   PNEUMOCOCCAL CONJUGATE-20 02/22/2021   PPD Test 01/31/2014, 02/12/2015, 03/10/2016, 06/18/2017, 07/28/2019, 10/01/2020   Pneumococcal-Unspecified 06/17/2008   Respiratory Syncytial Virus Vaccine,Recomb Aduvanted(Arexvy) 04/01/2023   Td 04/18/2007   Tdap 06/18/2017   Zoster Recombinant(Shingrix) 07/30/2020, 10/19/2020    Screening Tests Health Maintenance  Topic Date Due   DEXA SCAN  06/24/2020   Mammogram  10/25/2023   COVID-19 Vaccine (4 - 2025-26 season) 02/01/2024   Medicare Annual Wellness (AWV)  03/29/2025   DTaP/Tdap/Td (3 - Td or Tdap) 06/19/2027   Colonoscopy  09/09/2029   Pneumococcal Vaccine: 50+ Years  Completed   Influenza Vaccine  Completed   Hepatitis C Screening  Completed   Zoster Vaccines- Shingrix  Completed   Meningococcal B Vaccine  Aged Out    Health Maintenance Items  Addressed: Pt indicates she had Dexa Scan and MMG at [facility] in May 2025 Will obtain Covid at her pharmacy if decides to receive.  Additional Screening:  Vision Screening: Recommended annual ophthalmology exams for early detection of glaucoma and other disorders of the eye. Is the patient up to date with their annual eye exam?  Yes  Who is the provider or what is the name of the office in which the patient attends annual eye exams? Dr Madelyn  Dental Screening: Recommended annual dental exams for proper oral hygiene  Community Resource Referral / Chronic Care Management: CRR required this visit?  No   CCM required this visit?  Appt scheduled with PCP   Plan:    I have personally reviewed and noted the following in the patient's chart:   Medical and social history Use of alcohol, tobacco or illicit drugs  Current medications and supplements including opioid prescriptions. Patient is not currently taking opioid prescriptions. Functional ability and status Nutritional status Physical activity Advanced directives List of other physicians Hospitalizations, surgeries, and ER visits in previous 12 months Vitals Screenings to include cognitive, depression, and falls Referrals and appointments  In addition, I have reviewed and discussed with patient certain preventive protocols, quality  metrics, and best practice recommendations. A written personalized care plan for preventive services as well as general preventive health recommendations were provided to patient.   Erminio LITTIE Saris, LPN   [id number]   After Visit Summary: (MyChart) Due to this being a telephonic visit, the after visit summary with patients personalized plan was offered to patient via MyChart   Notes: Nothing significant to report at this time.

## 2024-03-30 NOTE — Progress Notes (Signed)
 Office Visit Note  Patient: Whitney Herrera             Date of Birth: 1956/02/03           MRN: [id number]             PCP: Corwin Antu, FNP Referring: Corwin Antu, FNP Visit Date: 04/13/2024 Occupation: Data Unavailable  Subjective:  Dry mouth, joint pain  History of Present Illness: Whitney Herrera is a 68 y.o. female seen for the evaluation of arthralgias and dry mouth.  According the patient her symptoms started with dry mouth about 10 years ago.  She states her PCP at that time diagnosed her with Sjogren's and referred her to Dr. Mai.  She states he agreed with the diagnosis of Sjogren's but no treatment was given.  She denies history of dry eyes, dry skin or vaginal dryness.  She does not recall taking any treatment for dry mouth symptoms.  She states for the last couple of years she has been noticing increased pain and stiffness in her hands.  She gives history of some swelling and numbness in her hands.  She also has some discomfort in her neck, shoulders and right knee.  She also has discomfort in her feet.  She was seen by Dr. Joya in September for left foot pain.  At that time she had x-rays and was diagnosed with tendinitis.  None of the other joints are swollen.  She was diagnosed with osteoporosis in the past.  Patient states she had recent DEXA scan which was in the osteopenia range.  She takes vitamin D .  There is no family history of autoimmune disease.  She is a retired, right-handed.  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  She enjoys biochemist, clinical.  She is married, gravida 2, para 2.  There is no history of preeclampsia or DVTs.  She drinks alcohol only occasionally.  She has never been a smoker.    Activities of Daily Living:  Patient reports morning stiffness for a few minutes.   Patient Reports nocturnal pain.  Difficulty dressing/grooming: Denies Difficulty climbing stairs: Denies Difficulty getting out of chair: Reports Difficulty using  hands for taps, buttons, cutlery, and/or writing: Denies  Review of Systems  Constitutional:  Negative for fatigue.  HENT:  Positive for mouth dryness. Negative for mouth sores.   Eyes:  Negative for dryness.  Respiratory:  Positive for shortness of breath.        On exertion   Cardiovascular:  Negative for chest pain and palpitations.  Gastrointestinal:  Negative for blood in stool, constipation and diarrhea.  Endocrine: Negative for increased urination.  Genitourinary:  Negative for involuntary urination.  Musculoskeletal:  Positive for joint pain, joint pain, joint swelling, myalgias, morning stiffness and myalgias. Negative for gait problem, muscle weakness and muscle tenderness.  Skin:  Negative for color change, rash, hair loss and sensitivity to sunlight.  Allergic/Immunologic: Negative for susceptible to infections.  Neurological:  Negative for dizziness and headaches.  Hematological:  Negative for swollen glands.  Psychiatric/Behavioral:  Negative for depressed mood and sleep disturbance. The patient is not nervous/anxious.     PMFS History:  Patient Active Problem List   Diagnosis Date Noted   SOB (shortness of breath) on exertion 02/03/2024   Fatigue due to excessive exertion 02/03/2024   Prediabetes 10/12/2023   History of palpitations 10/12/2023   GAD (generalized anxiety disorder) 10/12/2023   Asthmatic bronchitis 10/12/2023   Sleep  disorder 10/12/2023   Depression screening 10/12/2023   Reactive airway disease 10/12/2023   Attention deficit 02/22/2021   Iron  deficiency anemia 11/09/2019   Symptomatic anemia 11/04/2019   Osteoporosis 10/15/2018   GERD (gastroesophageal reflux disease) 04/26/2018   Sjogren's syndrome (HCC) 05/08/2014   Abnormal glucose    Essential hypertension    Hyperlipidemia, mixed    Morbid obesity (HCC) - BMI 40+    B12 deficiency    Vitamin D  deficiency     Past Medical History:  Diagnosis Date   Anemia    Arthritis    Asthma     B12 deficiency    GERD (gastroesophageal reflux disease)    Heart burn    Heart murmur    Hiatal hernia    Hyperlipidemia    Hypertension    Joint pain    Obesity    Palpitations    Prediabetes    Sjogren syndrome    Vitamin D  deficiency     Family History  Problem Relation Age of Onset   Hypertension Mother    Alzheimer's disease Mother    Varicose Veins Mother    Hypertension Father    Alzheimer's disease Father    Kidney disease Sister    ADD / ADHD Daughter    ADD / ADHD Son    Colon cancer Neg Hx    Rectal cancer Neg Hx    Stomach cancer Neg Hx    Esophageal cancer Neg Hx    Past Surgical History:  Procedure Laterality Date   APPENDECTOMY     BREAST BIOPSY Right 06/02/1973   COLONOSCOPY     INGUINAL HERNIA REPAIR Right age 87   UPPER GASTROINTESTINAL ENDOSCOPY     Social History   Tobacco Use   Smoking status: Never    Passive exposure: Past   Smokeless tobacco: Never  Vaping Use   Vaping status: Never Used  Substance Use Topics   Alcohol use: Yes    Alcohol/week: 2.0 standard drinks of alcohol    Types: 2 Glasses of wine per week    Comment: wine occasionally   Drug use: No   Social History   Social History Narrative   Not on file     Immunization History  Administered Date(s) Administered   INFLUENZA, HIGH DOSE SEASONAL PF 02/21/2021, 03/12/2022, 04/01/2023   Influenza Inj Mdck Quad Pf 03/09/2019   Influenza-Unspecified 04/28/2015, 02/26/2016, 03/09/2019, 03/09/2024   PFIZER(Purple Top)SARS-COV-2 Vaccination 08/10/2019, 09/07/2019, 12/22/2020   PNEUMOCOCCAL CONJUGATE-20 02/22/2021   PPD Test 01/31/2014, 02/12/2015, 03/10/2016, 06/18/2017, 07/28/2019, 10/01/2020   Pneumococcal-Unspecified 06/17/2008   Respiratory Syncytial Virus Vaccine,Recomb Aduvanted(Arexvy) 04/01/2023   Td 04/18/2007   Tdap 06/18/2017   Zoster Recombinant(Shingrix) 07/30/2020, 10/19/2020     Objective: Vital Signs: BP 126/84 (BP Location: Right Arm, Patient Position:  Sitting, Cuff Size: Large)   Pulse (!) 56   Temp (!) 97.4 F (36.3 C)   Resp 16   Ht 5' 2 (1.575 m)   Wt 226 lb 9.6 oz (102.8 kg)   BMI 41.45 kg/m    Physical Exam Vitals and nursing note reviewed.  Constitutional:      Appearance: She is well-developed.  HENT:     Head: Normocephalic and atraumatic.  Eyes:     Conjunctiva/sclera: Conjunctivae normal.  Cardiovascular:     Rate and Rhythm: Normal rate and regular rhythm.     Heart sounds: Normal heart sounds.  Pulmonary:     Effort: Pulmonary effort is normal.     Breath  sounds: Normal breath sounds.  Abdominal:     General: Bowel sounds are normal.     Palpations: Abdomen is soft.  Musculoskeletal:     Cervical back: Normal range of motion.  Lymphadenopathy:     Cervical: No cervical adenopathy.  Skin:    General: Skin is warm and dry.     Capillary Refill: Capillary refill takes less than 2 seconds.  Neurological:     Mental Status: She is alert and oriented to person, place, and time.  Psychiatric:        Behavior: Behavior normal.      Musculoskeletal Exam:Cervical spine was in good range of motion.  Thoracic kyphosis was noted.  She had no tenderness over thoracic or lumbar spine.    There was no SI joint tenderness.  Shoulder joints, elbow joints, wrist joints, MCPs, PIPs and DIPs were in good range of motion with no synovitis.  Bilateral DIP and PIP thickening was noted.  No synovitis was noted.  Hip joints and knee joints were in good range of motion without any warmth swelling or effusion.  She discomfort range of motion of her right knee.  There was no tenderness over ankles or MTPs.  No synovitis was noted.   CDAI Exam: CDAI Score: -- Patient Global: --; Provider Global: -- Swollen: --; Tender: -- Joint Exam 04/13/2024   No joint exam has been documented for this visit   There is currently no information documented on the homunculus. Go to the Rheumatology activity and complete the homunculus joint  exam.  Investigation: No additional findings.  Imaging: No results found.  Recent Labs: Lab Results  Component Value Date   WBC 5.8 10/12/2023   HGB 13.7 10/12/2023   PLT 209.0 10/12/2023   NA 142 02/03/2024   K 4.2 02/03/2024   CL 104 02/03/2024   CO2 23 02/03/2024   GLUCOSE 87 02/03/2024   BUN 14 02/03/2024   CREATININE 0.80 02/03/2024   BILITOT 0.3 10/12/2023   ALKPHOS 62 10/12/2023   AST 17 10/12/2023   ALT 15 10/12/2023   PROT 6.7 10/12/2023   ALBUMIN 4.2 10/12/2023   CALCIUM  9.0 02/03/2024   GFRAA 104 10/01/2020   Oct 12, 2023 LDL 120, triglyceride 237, iron  studies normal except TIBC mildly elevated, urine protein creatinine ratio normal, ANA 1: 40 NS, vitamin D38.86, hemoglobin A1c 6.0, sed rate 49, RF negative February 03, 2024 vitamin D49.8    Speciality Comments: No specialty comments available.  Procedures:  No procedures performed Allergies: Lipitor [atorvastatin] and Wellbutrin [bupropion]   Assessment / Plan:     Visit Diagnoses: Positive ANA (antinuclear antibody) -patient has low titer positive ANA.  She gives history of dry mouth and arthralgias.  There is no history of oral ulcers, nasal ulcers, malar rash, photosensitivity, Raynaud's or lymphadenopathy.  Plan: Sjogrens syndrome-A extractable nuclear antibody, Sjogrens syndrome-B extractable nuclear antibody, Anti-DNA antibody, double-stranded, C3 and C4, RNP Antibody, Anti-Smith antibody  Sicca syndrome-patient gives history of dry mouth and positive ANA for many years.  She states she was diagnosed with Sjogren syndrome by Dr. Mai but no treatment was advised.  She denies any history of dry eyes, vaginal dryness or dry skin.  She uses some over-the-counter products.  Pain in both hands -she complains of discomfort in her both hands for the last 2 years.  Bilateral DIP and PIP thickening with no synovitis was noted.  Joint protection muscle strengthening was discussed.  A handout on hand exercises  was given.  Plan: XR Hand 2 View Right, XR Hand 2 View Left.  X-ray with history of osteoarthritis.  Chronic pain of right knee -she gives history of intermittent discomfort in right knee joint.  No warmth swelling or effusion was noted.  Plan: XR KNEE 3 VIEW RIGHT.  X-ray showed moderate osteoarthritis and moderate chondromalacia patella.  Primary osteoarthritis of both feet -she has some stiffness in her feet.  I personally reviewed the space x-rays of right foot April 14, 2023 and x-rays of  left foot February 22, 2024 were reviewed which showed osteoarthritic changes and calcaneal spurs.  X-ray findings were reviewed with the patient.  Proper fitting shoes were advised.  Age-related osteoporosis without current pathological fracture - June 24, 2018 T-score -2.6 AP spine.  Patient states she has been taking vitamin D .  She states her most recent bone density [facility] was in osteopenia range.  I do not have that report available.  Vitamin D  deficiency-she takes vitamin D  supplement.  B12 deficiency-she takes B12 supplement.  Other iron  deficiency anemia  Essential hypertension-she is on Tenormin   Hyperlipidemia, mixed-she is on Crestor   Mild intermittent asthmatic bronchitis without complication  Gastroesophageal reflux disease without esophagitis  Attention deficit  Anxiety  Orders: Orders Placed This Encounter  Procedures   XR Hand 2 View Right   XR Hand 2 View Left   XR KNEE 3 VIEW RIGHT   Sjogrens syndrome-A extractable nuclear antibody   Sjogrens syndrome-B extractable nuclear antibody   Anti-DNA antibody, double-stranded   C3 and C4   RNP Antibody   Anti-Smith antibody   No orders of the defined types were placed in this encounter.    Follow-Up Instructions: Return for Osteoarthritis.   Maya Nash, MD  Note - This record has been created using Animal nutritionist.  Chart creation errors have been sought, but may not always  have been  located. Such creation errors do not reflect on  the standard of medical care.

## 2024-04-04 ENCOUNTER — Encounter: Payer: Self-pay | Admitting: Podiatry

## 2024-04-04 ENCOUNTER — Ambulatory Visit: Admitting: Podiatry

## 2024-04-04 DIAGNOSIS — M7672 Peroneal tendinitis, left leg: Secondary | ICD-10-CM | POA: Diagnosis not present

## 2024-04-04 NOTE — Progress Notes (Signed)
  Subjective:  Patient ID: Whitney Herrera, female    DOB: [birth year],   MRN: [id number]  Chief Complaint  Patient presents with   Tendonitis    It's much better, still a little tender but much better.    68 y.o. female presents for follow-up of left peroneal tendinitis.  Relates doing better about 75%.  She has been taking the meloxicam  and using the brace every now and then.  She has been occasionally stretching.   . Denies any other pedal complaints. Denies n/v/f/c.   Past Medical History:  Diagnosis Date   Anemia    Arthritis    Asthma    B12 deficiency    GERD (gastroesophageal reflux disease)    Heart burn    Heart murmur    Hiatal hernia    Hyperlipidemia    Hypertension    Joint pain    Obesity    Palpitations    Prediabetes    Sjogren syndrome    Vitamin D  deficiency     Objective:  Physical Exam: Vascular: DP/PT pulses 2/4 bilateral. CFT <3 seconds. Normal hair growth on digits. No edema.  Skin. No lacerations or abrasions bilateral feet.  Musculoskeletal: MMT 5/5 bilateral lower extremities in DF, PF, Inversion and Eversion. Deceased ROM in DF of ankle joint.  Minimally tender along peroneal tendon course from lateral malleolus to distal insertion and pain to lateral plantar heel lateral band of plantar fassia. Pain with eversion of the foot  Neurological: Sensation intact to light touch.   Assessment:   1. Peroneal tendonitis, left       Plan:  Patient was evaluated and treated and all questions answered. X-rays reviewed and discussed with patient. No acute fractures or dislocaitions noted.  Discussed peroneal tendinitis and treatment options at length with patient Continue brace and stretching as needed. Continue anti-inflammatories as needed. Discussed that if the symptoms do not improve can consider PT/MRI. Patient to return as needed   Asberry Failing, DPM

## 2024-04-07 ENCOUNTER — Encounter (INDEPENDENT_AMBULATORY_CARE_PROVIDER_SITE_OTHER): Payer: Self-pay | Admitting: Nurse Practitioner

## 2024-04-07 ENCOUNTER — Ambulatory Visit (INDEPENDENT_AMBULATORY_CARE_PROVIDER_SITE_OTHER): Payer: Self-pay | Admitting: Nurse Practitioner

## 2024-04-07 VITALS — BP 135/76 | HR 59 | Temp 98.3°F | Ht 62.0 in | Wt 223.0 lb

## 2024-04-07 DIAGNOSIS — E559 Vitamin D deficiency, unspecified: Secondary | ICD-10-CM | POA: Diagnosis not present

## 2024-04-07 DIAGNOSIS — I10 Essential (primary) hypertension: Secondary | ICD-10-CM | POA: Diagnosis not present

## 2024-04-07 DIAGNOSIS — R7303 Prediabetes: Secondary | ICD-10-CM | POA: Diagnosis not present

## 2024-04-07 DIAGNOSIS — E66813 Obesity, class 3: Secondary | ICD-10-CM

## 2024-04-07 DIAGNOSIS — Z6841 Body Mass Index (BMI) 40.0 and over, adult: Secondary | ICD-10-CM | POA: Diagnosis not present

## 2024-04-07 DIAGNOSIS — E88819 Insulin resistance, unspecified: Secondary | ICD-10-CM

## 2024-04-07 MED ORDER — TOPIRAMATE 50 MG PO TABS: ORAL_TABLET | ORAL | 1 refills | Status: AC

## 2024-04-07 NOTE — Progress Notes (Signed)
 Office: [phone]  /  Fax: [phone]  WEIGHT SUMMARY AND BIOMETRICS  Weight Lost Since Last Visit: 1 lb  Weight Gained Since Last Visit: 0   Vitals Temp: 98.3 F (36.8 C) BP: 135/76 Pulse Rate: (!) 59 SpO2: 94 %   Anthropometric Measurements Height: 5' 2 (1.575 m) Weight: 223 lb (101.2 kg) BMI (Calculated): 40.78 Weight at Last Visit: 224 lb Weight Lost Since Last Visit: 1 lb Weight Gained Since Last Visit: 0 Starting Weight: 233 lb Total Weight Loss (lbs): 10 lb (4.536 kg) Peak Weight: 235 lb   Body Composition  Body Fat %: 50.3 % Fat Mass (lbs): 112.2 lbs Muscle Mass (lbs): 105.4 lbs Total Body Water (lbs): 79.8 lbs Visceral Fat Rating : 17   Other Clinical Data Fasting: no Labs: no Today's Visit #: 4 Starting Date: 02/03/24    Total Weight Loss: 10 pounds  Percent of body weight lost: 4.3%  Bio Impedance Data reviewed with patient:Muscle is down 0.8 pounds, adipose is down 0.2 pounds. PBF increased slightly from 50.1% to 50.3%  HPI  Chief Complaint: OBESITY  Mystique is here to discuss her progress with her obesity treatment plan. She is on the the Category 1 Plan and states she is following her eating plan approximately 50-75 % of the time. She states she is not currently exercising   Interval History:  Since last office visit she is currently on Phentermine  8 mg every day and Topiramate  50 mg every day through her PCP for weight loss and denies side effects. She was to set a goal of chair exercises twice a week but has not yet started. She was at the beach for a few days. She has not been as good at sticking to plan the past several weeks. She will occasionally skip breakfast. She has been cooking chicken and vegetables. She has not been getting as many fruits and vegetables. She has been drinking maybe 32 ounces of water. Very rarely she will have a sweet tea.  She is cooking for Thanksgiving. Instructed on celebration eating strategies.     Zaida does have hypertension and BP's remain controlled on Atenolol  25 mg every day. She denies headaches, chest pain, shortness of breath at rest and dizziness BP Readings from Last 3 Encounters:  04/07/24 135/76  03/09/24 130/74  02/17/24 118/72   She continues on Rosuvastatin  20 mg every day for hyperlipidemia and denies side effects from medication.  She is limiting saturated fats in her diet.  She does have a history of Vit D deficiency and is currently managed with cholecalciferol 5000 units daily She is working on nutrition, exercise and weight loss to help control prediabetes/insulin  resistance.   PHYSICAL EXAM:  Blood pressure 135/76, pulse (!) 59, temperature 98.3 F (36.8 C), height 5' 2 (1.575 m), weight 223 lb (101.2 kg), SpO2 94%. Body mass index is 40.79 kg/m.  General: Well Developed, well nourished, and in no acute distress.  HEENT: Normocephalic, atraumatic; EOMI, sclerae are anicteric. Skin: Warm and dry, good turgor Chest:  Normal excursion, shape, no gross ABN Respiratory: No conversational dyspnea; speaking in full sentences NeuroM-Sk:  Normal gross ROM * 4 extremities  Psych: A and O X 3, insight adequate, mood- full    DIAGNOSTIC DATA REVIEWED:  Last metabolic panel Lab Results  Component Value Date   GLUCOSE 87 02/03/2024   NA 142 02/03/2024   K 4.2 02/03/2024   CL 104 02/03/2024   CO2 23 02/03/2024   BUN 14 02/03/2024  CREATININE 0.80 02/03/2024   EGFR 80 02/03/2024   CALCIUM  9.0 02/03/2024   PROT 6.7 10/12/2023   ALBUMIN 4.2 10/12/2023   BILITOT 0.3 10/12/2023   ALKPHOS 62 10/12/2023   AST 17 10/12/2023   ALT 15 10/12/2023   ANIONGAP 9 02/15/2021     Lab Results  Component Value Date   HGBA1C 6.0 10/12/2023   HGBA1C 5.9 (H) 04/20/2013   Lab Results  Component Value Date   INSULIN  17.2 02/03/2024   Lab Results  Component Value Date   TSH 2.30 04/20/2023   CBC    Component Value Date/Time   WBC 5.8 10/12/2023 1243    RBC 4.82 10/12/2023 1243   HGB 13.7 10/12/2023 1243   HGB 12.2 02/15/2021 1116   HGB 13.7 05/10/2013 1441   HCT 42.9 10/12/2023 1243   HCT 41.4 05/10/2013 1441   PLT 209.0 10/12/2023 1243   PLT 226 02/15/2021 1116   PLT 187 05/10/2013 1441   MCV 89.1 10/12/2023 1243   MCV 89.3 05/10/2013 1441   MCH 26.7 (L) 04/20/2023 1042   MCHC 31.9 10/12/2023 1243   RDW 14.8 10/12/2023 1243   RDW 13.6 05/10/2013 1441   Lipid Panel     Component Value Date/Time   CHOL 217 (H) 10/12/2023 1243   TRIG 237.0 (H) 10/12/2023 1243   HDL 49.30 10/12/2023 1243   CHOLHDL 4 10/12/2023 1243   VLDL 47.4 (H) 10/12/2023 1243   LDLCALC 120 (H) 10/12/2023 1243   LDLCALC 75 04/20/2023 1042    Nutritional Lab Results  Component Value Date   VD25OH 49.8 02/03/2024   VD25OH 38.86 10/12/2023   VD25OH 57 04/20/2023     ASSESSMENT AND PLAN   Class 3 severe obesity with serious comorbidity and body mass index (BMI) of 40.0 to 44.9 in adult, unspecified obesity type (HCC) TREATMENT PLAN FOR OBESITY:  Recommended Dietary Goals  Countess is currently in the action stage of change. As such, her goal is to continue weight management plan. She has agreed to the Category 1 Plan.  Behavioral Intervention  We discussed the following Behavioral Modification Strategies today: increasing lean protein intake to established goals, increasing vegetables, avoiding skipping meals, increasing water intake , continue to work on maintaining a reduced calorie state, getting the recommended amount of protein, incorporating whole foods, making healthy choices, staying well hydrated and practicing mindfulness when eating., increase protein intake, fibrous foods (25 grams per day for women, 30 grams for men) and water to improve satiety and decrease hunger signals. , and getting back on track after recent relapse.  Additional resources provided today: NA  Recommended Physical Activity Goals  Tenise has been advised to work up to  150 minutes of moderate intensity aerobic activity a week and strengthening exercises 2-3 times per week for cardiovascular health, weight loss maintenance and preservation of muscle mass.   She has agreed to Patient will begin to exercise 2 days a week with chair exercises, Think about enjoyable ways to increase daily physical activity and overcoming barriers to exercise, and Increase physical activity in their day and reduce sedentary time (increase NEAT).   Pharmacotherapy We discussed various medication options to help Kayle with her weight loss efforts and we both agreed to continue nutrition and behavior modification.  ASSOCIATED CONDITIONS ADDRESSED TODAY  Action/Plan  Essential hypertension Continue Atenolol  25 mg every day Focus on implementing Category 1 meal plan  and DASH diet Monitor BP and if consistently >140/90 notify PCP If develops headaches, chest pain,  shortness of breath or dizziness go to ER Follow regularly with PCP  Prediabetes Insulin  resistance Continue to focus on Category 1  meal plan, limit simple carbohydrates Start chair exercises 2 days a week  Vitamin D  deficiency       Continue cholecalciferol 5000 units daily, last lab at goal       Vitamin d  supplementation has been shown to decrease fatigue, decrease risk of progression to insulin  resistance and then prediabetes, decreases risk of falling in older age and can even assist in decreasing depressive symptoms in PTSD    Class 3 severe obesity with serious comorbidity and body mass index (BMI) of 40.0 to 44.9 in adult, unspecified obesity type (HCC) See plan above -     Topiramate ; Take one tablet once daily  Dispense: 90 tablet; Refill: 1         Return in about 4 weeks (around 05/05/2024).SABRA She was informed of the importance of frequent follow up visits to maximize her success with intensive lifestyle modifications for her multiple health conditions.   ATTESTASTION STATEMENTS:  Reviewed by  clinician on day of visit: allergies, medications, problem list, medical history, surgical history, family history, social history, and previous encounter notes.   I personally spent a total of 40 minutes in the care of the patient today including preparing to see the patient, getting/reviewing separately obtained history, performing a medically appropriate exam/evaluation, counseling and educating, placing orders, and documenting clinical information in the EHR.   Shaunice Levitan ANP-C

## 2024-04-13 ENCOUNTER — Ambulatory Visit (INDEPENDENT_AMBULATORY_CARE_PROVIDER_SITE_OTHER)

## 2024-04-13 ENCOUNTER — Ambulatory Visit: Attending: Rheumatology | Admitting: Rheumatology

## 2024-04-13 ENCOUNTER — Encounter: Payer: Self-pay | Admitting: Rheumatology

## 2024-04-13 ENCOUNTER — Ambulatory Visit

## 2024-04-13 VITALS — BP 126/84 | HR 56 | Temp 97.4°F | Resp 16 | Ht 62.0 in | Wt 226.6 lb

## 2024-04-13 DIAGNOSIS — M3500 Sicca syndrome, unspecified: Secondary | ICD-10-CM | POA: Diagnosis not present

## 2024-04-13 DIAGNOSIS — E559 Vitamin D deficiency, unspecified: Secondary | ICD-10-CM

## 2024-04-13 DIAGNOSIS — I10 Essential (primary) hypertension: Secondary | ICD-10-CM | POA: Diagnosis not present

## 2024-04-13 DIAGNOSIS — M25561 Pain in right knee: Secondary | ICD-10-CM

## 2024-04-13 DIAGNOSIS — G8929 Other chronic pain: Secondary | ICD-10-CM

## 2024-04-13 DIAGNOSIS — R7689 Other specified abnormal immunological findings in serum: Secondary | ICD-10-CM

## 2024-04-13 DIAGNOSIS — D508 Other iron deficiency anemias: Secondary | ICD-10-CM

## 2024-04-13 DIAGNOSIS — M79641 Pain in right hand: Secondary | ICD-10-CM

## 2024-04-13 DIAGNOSIS — J4520 Mild intermittent asthma, uncomplicated: Secondary | ICD-10-CM

## 2024-04-13 DIAGNOSIS — K219 Gastro-esophageal reflux disease without esophagitis: Secondary | ICD-10-CM

## 2024-04-13 DIAGNOSIS — M19071 Primary osteoarthritis, right ankle and foot: Secondary | ICD-10-CM

## 2024-04-13 DIAGNOSIS — M79642 Pain in left hand: Secondary | ICD-10-CM

## 2024-04-13 DIAGNOSIS — E538 Deficiency of other specified B group vitamins: Secondary | ICD-10-CM | POA: Diagnosis not present

## 2024-04-13 DIAGNOSIS — M19072 Primary osteoarthritis, left ankle and foot: Secondary | ICD-10-CM

## 2024-04-13 DIAGNOSIS — E782 Mixed hyperlipidemia: Secondary | ICD-10-CM

## 2024-04-13 DIAGNOSIS — M810 Age-related osteoporosis without current pathological fracture: Secondary | ICD-10-CM

## 2024-04-13 DIAGNOSIS — R41840 Attention and concentration deficit: Secondary | ICD-10-CM

## 2024-04-13 DIAGNOSIS — F419 Anxiety disorder, unspecified: Secondary | ICD-10-CM

## 2024-04-13 NOTE — Patient Instructions (Signed)
 Exercises for Chronic Knee Pain Chronic knee pain is pain that lasts longer than 3 months. For most people with chronic knee pain, exercise and weight loss is an important part of treatment. Your health care provider may want you to focus on: Making the muscles that support your knee stronger. This can take pressure off your knee and reduce pain. Preventing knee stiffness. How far you can move your knee, keeping it there or making it farther. Losing weight (if this applies) to take pressure off your knee, lower your risk for injury, and make it easier for you to exercise. Your provider will help you make an exercise program that fits your needs and physical abilities. Below are simple, low-impact exercises you can do at home. Ask your provider or physical therapist how often you should do your exercise program and how many times to repeat each exercise. General safety tips  Get your provider's approval before doing any exercises. Start slowly and stop any time you feel pain. Do not exercise if your knee pain is flaring up. Warm up first. Stretching a cold muscle can cause an injury. Do 5-10 minutes of easy movement or light stretching before beginning your exercises. Do 5-10 minutes of low-impact activity (like walking or cycling) before starting strengthening exercises. Contact your provider any time you have pain during or after exercising. Exercise can cause discomfort but should not be painful. It is normal to be a little stiff or sore after exercising. Stretching and range-of-motion exercises Front thigh stretch  Stand up straight and support your body by holding on to a chair or resting one hand on a wall. With your legs straight and close together, bend one knee to lift your heel up toward your butt. Using one hand for support, grab your ankle with your free hand. Pull your foot up closer toward your butt to feel the stretch in front of your thigh. Hold the stretch for 30  seconds. Repeat __________ times. Complete this exercise __________ times a day. Back thigh stretch  Sit on the floor with your back straight and your legs out straight in front of you. Place the palms of your hands on the floor and slide them toward your feet as you bend at the hip. Try to touch your nose to your knees and feel the stretch in the back of your thighs. Hold for 30 seconds. Repeat __________ times. Complete this exercise __________ times a day. Calf stretch  Stand facing a wall. Place the palms of your hands flat against the wall, arms extended, and lean slightly against the wall. Get into a lunge position with one leg bent at the knee and the other leg stretched out straight behind you. Keep both feet facing the wall and increase the bend in your knee while keeping the heel of the other leg flat on the ground. You should feel the stretch in your calf. Hold for 30 seconds. Repeat __________ times. Complete this exercise __________ times a day. Strengthening exercises Straight leg lift  Lie on your back with one knee bent and the other leg out straight. Slowly lift the straight leg without bending the knee. Lift until your foot is about 12 inches (30 cm) off the floor. Hold for 3-5 seconds and slowly lower your leg. Repeat __________ times. Complete this exercise __________ times a day. Single leg dip  Stand between two chairs and put both hands on the backs of the chairs for support. Extend one leg out straight with your body  weight resting on the heel of the standing leg. Slowly bend your standing knee to dip your body to the level that is comfortable for you. Hold for 3-5 seconds. Repeat __________ times. Complete this exercise __________ times a day. Hamstring curls  Stand straight, knees close together, facing the back of a chair. Hold on to the back of a chair with both hands. Keep one leg straight. Bend the other knee while bringing the heel up toward the butt  until the knee is bent at a 90-degree angle (right angle). Hold for 3-5 seconds. Repeat __________ times. Complete this exercise __________ times a day. Wall squat  Stand straight with your back, hips, and head against a wall. Step forward one foot at a time with your back still against the wall. Your feet should be 2 feet (61 cm) from the wall at shoulder width. Keeping your back, hips, and head against the wall, slide down the wall to as close to a sitting position as you can get. Hold for 5-10 seconds, then slowly slide back up. Repeat __________ times. Complete this exercise __________ times a day. Step-ups  Stand in front of a sturdy platform or stool that is about 6 inches (15 cm) high. Slowly step up with your left / right foot, keeping your knee in line with your hip and foot. Do not let your knee bend so far that you cannot see your toes. Hold on to a chair for balance, but do not use it for support. Slowly unlock your knee and lower yourself to the starting position. Repeat __________ times. Complete this exercise __________ times a day. Contact a health care provider if: Your exercises cause pain. Your pain is worse after you exercise. Your pain prevents you from doing your exercises. This information is not intended to replace advice given to you by your health care provider. Make sure you discuss any questions you have with your health care provider. Document Revised: 06/03/2022 Document Reviewed: 06/03/2022 Elsevier Patient Education  2024 [employer]  Hand Exercises Hand exercises can be helpful for almost anyone. They can strengthen your hands and improve flexibility and movement. The exercises can also increase blood flow to the hands. These results can make your work and daily tasks easier for you. Hand exercises can be especially helpful for people who have joint pain from arthritis or nerve damage from using their hands over and over. These exercises can also help  people who injure a hand. Exercises Most of these hand exercises are gentle stretching and motion exercises. It is usually safe to do them often throughout the day. Warming up your hands before exercise may help reduce stiffness. You can do this with gentle massage or by placing your hands in warm water for 10-15 minutes. It is normal to feel some stretching, pulling, tightness, or mild discomfort when you begin new exercises. In time, this will improve. Remember to always be careful and stop right away if you feel sudden, very bad pain or your pain gets worse. You want to get better and be safe. Ask your health care provider which exercises are safe for you. Do exercises exactly as told by your provider and adjust them as told. Do not begin these exercises until told by your provider. Knuckle bend or "claw" fist  Stand or sit with your arm, hand, and all five fingers pointed straight up. Make sure to keep your wrist straight. Gently bend your fingers down toward your palm until the tips of your  fingers are touching your palm. Keep your big knuckle straight and only bend the small knuckles in your fingers. Hold this position for 10 seconds. Straighten your fingers back to your starting position. Repeat this exercise 5-10 times with each hand. Full finger fist  Stand or sit with your arm, hand, and all five fingers pointed straight up. Make sure to keep your wrist straight. Gently bend your fingers into your palm until the tips of your fingers are touching the middle of your palm. Hold this position for 10 seconds. Extend your fingers back to your starting position, stretching every joint fully. Repeat this exercise 5-10 times with each hand. Straight fist  Stand or sit with your arm, hand, and all five fingers pointed straight up. Make sure to keep your wrist straight. Gently bend your fingers at the big knuckle, where your fingers meet your hand, and at the middle knuckle. Keep the knuckle at  the tips of your fingers straight and try to touch the bottom of your palm. Hold this position for 10 seconds. Extend your fingers back to your starting position, stretching every joint fully. Repeat this exercise 5-10 times with each hand. Tabletop  Stand or sit with your arm, hand, and all five fingers pointed straight up. Make sure to keep your wrist straight. Gently bend your fingers at the big knuckle, where your fingers meet your hand, as far down as you can. Keep the small knuckles in your fingers straight. Think of forming a tabletop with your fingers. Hold this position for 10 seconds. Extend your fingers back to your starting position, stretching every joint fully. Repeat this exercise 5-10 times with each hand. Finger spread  Place your hand flat on a table with your palm facing down. Make sure your wrist stays straight. Spread your fingers and thumb apart from each other as far as you can until you feel a gentle stretch. Hold this position for 10 seconds. Bring your fingers and thumb tight together again. Hold this position for 10 seconds. Repeat this exercise 5-10 times with each hand. Making circles  Stand or sit with your arm, hand, and all five fingers pointed straight up. Make sure to keep your wrist straight. Make a circle by touching the tip of your thumb to the tip of your index finger. Hold for 10 seconds. Then open your hand wide. Repeat this motion with your thumb and each of your fingers. Repeat this exercise 5-10 times with each hand. Thumb motion  Sit with your forearm resting on a table and your wrist straight. Your thumb should be facing up toward the ceiling. Keep your fingers relaxed as you move your thumb. Lift your thumb up as high as you can toward the ceiling. Hold for 10 seconds. Bend your thumb across your palm as far as you can, reaching the tip of your thumb for the small finger (pinkie) side of your palm. Hold for 10 seconds. Repeat this exercise  5-10 times with each hand. Grip strengthening  Hold a stress ball or other soft ball in the middle of your hand. Slowly increase the pressure, squeezing the ball as much as you can without causing pain. Think of bringing the tips of your fingers into the middle of your palm. All of your finger joints should bend when doing this exercise. Hold your squeeze for 10 seconds, then relax. Repeat this exercise 5-10 times with each hand. Contact a health care provider if: Your hand pain or discomfort gets much worse when  you do an exercise. Your hand pain or discomfort does not improve within 2 hours after you exercise. If you have either of these problems, stop doing these exercises right away. Do not do them again unless your provider says that you can. Get help right away if: You develop sudden, severe hand pain or swelling. If this happens, stop doing these exercises right away. Do not do them again unless your provider says that you can. This information is not intended to replace advice given to you by your health care provider. Make sure you discuss any questions you have with your health care provider. Document Revised: 06/03/2022 Document Reviewed: 06/03/2022 Elsevier Patient Education  2024 [employer].

## 2024-04-14 ENCOUNTER — Encounter: Admitting: Rheumatology

## 2024-04-14 LAB — RNP ANTIBODY: Ribonucleic Protein(ENA) Antibody, IgG: <1 AI

## 2024-04-14 LAB — ANTI-SMITH ANTIBODY: ENA SM Ab Ser-aCnc: <1 AI

## 2024-04-14 LAB — SJOGRENS SYNDROME-A EXTRACTABLE NUCLEAR ANTIBODY: SSA (Ro) (ENA) Antibody, IgG: 4.7 AI — AB

## 2024-04-14 LAB — C3 AND C4
C3 Complement: 184 mg/dL (ref 83–193)
C4 Complement: 34 mg/dL (ref 15–57)

## 2024-04-14 LAB — SJOGRENS SYNDROME-B EXTRACTABLE NUCLEAR ANTIBODY: SSB (La) (ENA) Antibody, IgG: <1 AI

## 2024-04-14 LAB — ANTI-DNA ANTIBODY, DOUBLE-STRANDED: ds DNA Ab: <1 [IU]/mL

## 2024-04-18 ENCOUNTER — Ambulatory Visit: Payer: Self-pay | Admitting: Rheumatology

## 2024-04-18 NOTE — Progress Notes (Signed)
 SSA positive, SSB negative, dsDNA negative, RNP negative, Smith negative, complements normal.  I will discuss results at the follow-up visit.

## 2024-04-27 NOTE — Progress Notes (Signed)
 Office Visit Note  Patient: Whitney Herrera             Date of Birth: July 06, 1955           MRN: [id number]             PCP: Corwin Antu, FNP Referring: Corwin Antu, FNP Visit Date: 05/11/2024 Occupation: Data Unavailable  Subjective:  Pain in joints  History of Present Illness: Whitney Herrera is a 68 y.o. female with Sjogren's, osteoarthritis and osteoporosis.  She returns today after her initial visit on April 13, 2024.  She continues to have fatigue and dry mouth symptoms.  She continues to have some joint discomfort.  She reports discomfort in her hands, trapezius region, upper back, knee joints.  She has not noticed any joint swelling.  She denies any history of oral ulcers, nasal ulcers, malar rash, photosensitivity, Raynaud's, shortness of breath or lymphadenopathy.    Activities of Daily Living:  Patient reports morning stiffness for 5-10 minutes.   Patient Reports nocturnal pain.  Difficulty dressing/grooming: Denies Difficulty climbing stairs: Denies Difficulty getting out of chair: Reports Difficulty using hands for taps, buttons, cutlery, and/or writing: Denies  Review of Systems  Constitutional:  Positive for fatigue.  HENT:  Positive for mouth dryness. Negative for mouth sores.   Eyes:  Negative for dryness.  Respiratory:  Negative for shortness of breath.   Cardiovascular:  Negative for chest pain and palpitations.  Gastrointestinal:  Negative for blood in stool, constipation and diarrhea.  Endocrine: Negative for increased urination.  Genitourinary:  Negative for involuntary urination.  Musculoskeletal:  Positive for joint pain, joint pain, joint swelling, myalgias, muscle weakness, morning stiffness and myalgias. Negative for gait problem and muscle tenderness.  Skin:  Negative for color change, rash, hair loss and sensitivity to sunlight.  Allergic/Immunologic: Negative for susceptible to infections.  Neurological:  Negative for dizziness and  headaches.  Hematological:  Negative for swollen glands.  Psychiatric/Behavioral:  Negative for depressed mood and sleep disturbance. The patient is not nervous/anxious.     PMFS History:  Patient Active Problem List   Diagnosis Date Noted   SOB (shortness of breath) on exertion 02/03/2024   Fatigue due to excessive exertion 02/03/2024   Prediabetes 10/12/2023   History of palpitations 10/12/2023   GAD (generalized anxiety disorder) 10/12/2023   Asthmatic bronchitis 10/12/2023   Sleep disorder 10/12/2023   Depression screening 10/12/2023   Reactive airway disease 10/12/2023   Attention deficit 02/22/2021   Iron  deficiency anemia 11/09/2019   Symptomatic anemia 11/04/2019   Osteoporosis 10/15/2018   GERD (gastroesophageal reflux disease) 04/26/2018   Sjogren's syndrome (HCC) 05/08/2014   Abnormal glucose    Essential hypertension    Hyperlipidemia, mixed    Morbid obesity (HCC) - BMI 40+    B12 deficiency    Vitamin D  deficiency     Past Medical History:  Diagnosis Date   Anemia    Arthritis    Asthma    B12 deficiency    GERD (gastroesophageal reflux disease)    Heart burn    Heart murmur    Hiatal hernia    Hyperlipidemia    Hypertension    Joint pain    Obesity    Palpitations    Prediabetes    Sjogren syndrome    Vitamin D  deficiency     Family History  Problem Relation Age of Onset   Hypertension Mother    Alzheimer's disease Mother    Varicose Veins Mother  Hypertension Father    Alzheimer's disease Father    Kidney disease Sister    ADD / ADHD Daughter    ADD / ADHD Son    Colon cancer Neg Hx    Rectal cancer Neg Hx    Stomach cancer Neg Hx    Esophageal cancer Neg Hx    Past Surgical History:  Procedure Laterality Date   APPENDECTOMY     BREAST BIOPSY Right 06/02/1973   COLONOSCOPY     INGUINAL HERNIA REPAIR Right age 1   UPPER GASTROINTESTINAL ENDOSCOPY     Social History   Tobacco Use   Smoking status: Never    Passive exposure:  Past   Smokeless tobacco: Never  Vaping Use   Vaping status: Never Used  Substance Use Topics   Alcohol use: Yes    Alcohol/week: 2.0 standard drinks of alcohol    Types: 2 Glasses of wine per week    Comment: wine occasionally   Drug use: No   Social History   Social History Narrative   Not on file     Immunization History  Administered Date(s) Administered   INFLUENZA, HIGH DOSE SEASONAL PF 02/21/2021, 03/12/2022, 04/01/2023   Influenza Inj Mdck Quad Pf 03/09/2019   Influenza-Unspecified 04/28/2015, 02/26/2016, 03/09/2019, 03/09/2024   PFIZER(Purple Top)SARS-COV-2 Vaccination 08/10/2019, 09/07/2019, 12/22/2020   PNEUMOCOCCAL CONJUGATE-20 02/22/2021   PPD Test 01/31/2014, 02/12/2015, 03/10/2016, 06/18/2017, 07/28/2019, 10/01/2020   Pneumococcal-Unspecified 06/17/2008   Respiratory Syncytial Virus Vaccine,Recomb Aduvanted(Arexvy) 04/01/2023   Td 04/18/2007   Tdap 06/18/2017   Zoster Recombinant(Shingrix) 07/30/2020, 10/19/2020     Objective: Vital Signs: BP 122/71   Pulse (!) 54   Temp 97.6 F (36.4 C)   Resp 15   Ht 5' 2 (1.575 m)   Wt 228 lb (103.4 kg)   BMI 41.70 kg/m    Physical Exam Vitals and nursing note reviewed.  Constitutional:      Appearance: She is well-developed.  HENT:     Head: Normocephalic and atraumatic.  Eyes:     Conjunctiva/sclera: Conjunctivae normal.  Cardiovascular:     Rate and Rhythm: Normal rate and regular rhythm.     Heart sounds: Normal heart sounds.  Pulmonary:     Effort: Pulmonary effort is normal.     Breath sounds: Normal breath sounds.  Abdominal:     General: Bowel sounds are normal.     Palpations: Abdomen is soft.  Musculoskeletal:     Cervical back: Normal range of motion.  Lymphadenopathy:     Cervical: No cervical adenopathy.  Skin:    General: Skin is warm and dry.     Capillary Refill: Capillary refill takes less than 2 seconds.  Neurological:     Mental Status: She is alert and oriented to person,  place, and time.  Psychiatric:        Behavior: Behavior normal.      Musculoskeletal Exam: Cervical spine was in good range of motion.  Thoracic kyphosis was noted without any tenderness.  She had no tenderness over lumbar region.  Shoulders, elbows, wrist joints with good range of motion.  She had bilateral PIP and DIP thickening.  Hip joints and knee joints in good range of motion.  She has crepitus in her knee joints without any warmth swelling or effusion.  There was no tenderness over her ankles or MTPs.  No synovitis was noted.  CDAI Exam: CDAI Score: -- Patient Global: --; Provider Global: -- Swollen: --; Tender: -- Joint Exam 05/11/2024  No joint exam has been documented for this visit   There is currently no information documented on the homunculus. Go to the Rheumatology activity and complete the homunculus joint exam.  Investigation: No additional findings.  Imaging: XR KNEE 3 VIEW RIGHT Result Date: 04/13/2024 Moderate medial compartment narrowing was noted.  Moderate patellofemoral narrowing was noted.  No chondrocalcinosis was noted. Impression: These findings suggestive of moderate osteoarthritis and moderate chondromalacia patella.  XR Hand 2 View Left Result Date: 04/13/2024 CMC, PIP and DIP narrowing was noted.  No MCP, intercarpal or or radiocarpal joint space narrowing was noted.  No erosive changes were noted. Impression: These findings suggestive of osteoarthritis of the hand.  XR Hand 2 View Right Result Date: 04/13/2024 CMC, PIP and DIP narrowing was noted.  Erosive change was noted in the fifth digit DIP joint.  No MCP, intercarpal or or radiocarpal joint space narrowing was noted. Impression: These findings are suggestive of osteoarthritis of the hand.   Recent Labs: Lab Results  Component Value Date   WBC 5.8 10/12/2023   HGB 13.7 10/12/2023   PLT 209.0 10/12/2023   NA 142 02/03/2024   K 4.2 02/03/2024   CL 104 02/03/2024   CO2 23 02/03/2024    GLUCOSE 87 02/03/2024   BUN 14 02/03/2024   CREATININE 0.80 02/03/2024   BILITOT 0.3 10/12/2023   ALKPHOS 62 10/12/2023   AST 17 10/12/2023   ALT 15 10/12/2023   PROT 6.7 10/12/2023   ALBUMIN 4.2 10/12/2023   CALCIUM  9.0 02/03/2024   GFRAA 104 10/01/2020   April 13, 2024 SSA 4.7, SSB negative, dsDNA negative, RNP negative, Smith negative, C3-C4 normal.   Oct 12, 2023 LDL 120, triglyceride 237, iron  studies normal except TIBC mildly elevated, urine protein creatinine ratio normal, ANA 1: 40 NS, vitamin D38.86, hemoglobin A1c 6.0, sed rate 49, RF negative February 03, 2024 vitamin D49.8  Speciality Comments: No specialty comments available.  Procedures:  No procedures performed Allergies: Lipitor [atorvastatin] and Wellbutrin [bupropion]   Assessment / Plan:     Visit Diagnoses: Sjogren's syndrome with other organ involvement - Positive ANA, positive SSA, dry mouth, arthralgias: She continues to have dry mouth symptoms.  She does not have dry eye symptoms.  Patient states she had an EKG recently which was normal.  She denies any history of palpitations or shortness of breath.  There is no history of lymphadenopathy.  No lymphadenopathy was noted on the examination.  Detailed counsel regarding Sjogren's was provided.  A handout was placed in the AVS.  Association of Sjogren's with parotid swelling, lymphadenopathy, lymphoma, arrhythmias, ILD and inflammatory arthritis was discussed.  Advised her to contact me if she develops any new symptoms.  Over-the-counter products for sicca symptoms were discussed.  Primary osteoarthritis of both hands - Clinical and radiographic findings with history of osteoarthritis.  X-ray findings were reviewed with the patient.  Joint protection muscle strengthening was discussed.  A handout on hand exercises was given.  Primary osteoarthritis of right knee - History of intermittent discomfort.  X-ray obtained at the last visit showed moderate osteoarthritis  and moderate chondromalacia patella.  X-ray findings were reviewed with the patient.  I offered physical therapy which she declined.  A handout on lower extremity muscle strengthening size was given.  Primary osteoarthritis of both feet - Clinical and radiographic findings with history of osteoarthritis.  X-ray findings were reviewed with the patient.  Proper fitting shoes were advised.  Chronic midline thoracic back pain-she complains of some  thoracic and trapezius discomfort.  She has postural kyphosis.  A handout on upper and lower back exercises was given.  Age-related osteoporosis without current pathological fracture - June 24, 2018 T-score -2.6 AP spine.  Patient states she had a recent DEXA scan with her GYN which showed osteopenia.  Calcium  rich diet and exercise was emphasized.  Vitamin D  deficiency-she has been taking vitamin D .  Other medical problems are listed as follows:  B12 deficiency  Other iron  deficiency anemia  Essential hypertension-pressure was normal at 122/71.  Hyperlipidemia, mixed  Mild intermittent asthmatic bronchitis without complication  Gastroesophageal reflux disease without esophagitis  Attention deficit  Anxiety  Orders: No orders of the defined types were placed in this encounter.  No orders of the defined types were placed in this encounter.   Follow-Up Instructions: Return in about 6 months (around 11/09/2024) for Sjogren's, Osteoarthritis.   Maya Nash, MD  Note - This record has been created using Animal nutritionist.  Chart creation errors have been sought, but may not always  have been located. Such creation errors do not reflect on  the standard of medical care.

## 2024-05-05 ENCOUNTER — Ambulatory Visit (INDEPENDENT_AMBULATORY_CARE_PROVIDER_SITE_OTHER): Admitting: Nurse Practitioner

## 2024-05-05 ENCOUNTER — Encounter (INDEPENDENT_AMBULATORY_CARE_PROVIDER_SITE_OTHER): Payer: Self-pay | Admitting: Nurse Practitioner

## 2024-05-05 VITALS — BP 138/69 | HR 67 | Temp 98.1°F | Ht 62.0 in | Wt 223.0 lb

## 2024-05-05 DIAGNOSIS — I10 Essential (primary) hypertension: Secondary | ICD-10-CM

## 2024-05-05 DIAGNOSIS — Z6841 Body Mass Index (BMI) 40.0 and over, adult: Secondary | ICD-10-CM

## 2024-05-05 DIAGNOSIS — E559 Vitamin D deficiency, unspecified: Secondary | ICD-10-CM

## 2024-05-05 DIAGNOSIS — R7303 Prediabetes: Secondary | ICD-10-CM

## 2024-05-05 DIAGNOSIS — E66813 Obesity, class 3: Secondary | ICD-10-CM

## 2024-05-05 DIAGNOSIS — E88819 Insulin resistance, unspecified: Secondary | ICD-10-CM

## 2024-05-05 DIAGNOSIS — E782 Mixed hyperlipidemia: Secondary | ICD-10-CM | POA: Diagnosis not present

## 2024-05-05 NOTE — Progress Notes (Signed)
 Office: [phone]  /  Fax: [phone]  WEIGHT SUMMARY AND BIOMETRICS  Weight Lost Since Last Visit: 0  Weight Gained Since Last Visit: 0   Vitals Temp: 98.1 F (36.7 C) BP: 138/69 Pulse Rate: 67 SpO2: 96 %   Anthropometric Measurements Height: 5' 2 (1.575 m) Weight: 223 lb (101.2 kg) BMI (Calculated): 40.78 Weight at Last Visit: 223 lb Weight Lost Since Last Visit: 0 Weight Gained Since Last Visit: 0 Starting Weight: 233 lb Total Weight Loss (lbs): 10 lb (4.536 kg) Peak Weight: 235 lb   Body Composition  Body Fat %: 50 % Fat Mass (lbs): 111.6 lbs Muscle Mass (lbs): 105.8 lbs Total Body Water (lbs): 78.8 lbs Visceral Fat Rating : 17   Other Clinical Data Fasting: no Labs: no Today's Visit #: 5 Starting Date: 02/03/24    Total Weight Loss:10 pounds Percent of body weight lost: 4.3% Bio Impedance Data reviewed with patient: Muscle is up 0.4 pounds, adipose is down 0.6 pounds.  HPI  Chief Complaint: OBESITY  Tykerria is here to discuss her progress with her obesity treatment plan. She is on the the Category 1 Plan and states she is following her eating plan approximately 60 % of the time. She states she is currently not exercising.    Interval History:  Since last office visit she is currently on Lomaira  8 mg every day and Topiramate  50 mg every day through her PCP for weight loss. She did have Thanksgiving since last visit.  She doe think she is getting more protein- may not quite be 80 grams. Still will skip breakfast occasionally- She is drinking more water, uncertain of amount. She was to start chair exercises 2 days a week but has not yet started.  Christmas brunch will be at her house- cooks shrimp and grits, breakfast casserole.  Breakfast: skip, eggs or protein shake Lunch: turkey sandwich with fruit. If goes out will get chicken sandwich Dinner: [employer] with green beans and broccoli.  She will eat occasional sweets- fruit cake  Maryjayne does  have prediabetes /insulin  resistance and is currently focusing on nutrition and  weight loss to help lower glucose/insulin  levels.   Meg does have hypertension and her BP's are currently well controlled on Atenolol  25 mg every day. Denies headaches, chest pain shortness of breath at rest and dizziness  BP Readings from Last 3 Encounters:  05/05/24 138/69  04/13/24 126/84  04/07/24 135/76   She does have vit D deficiency but last value was in therapeutic range of cholecalciferol 5000 units daily- denies side effects Last vitamin D  Lab Results  Component Value Date   VD25OH 49.8 02/03/2024     PHYSICAL EXAM:  Blood pressure 138/69, pulse 67, temperature 98.1 F (36.7 C), height 5' 2 (1.575 m), weight 223 lb (101.2 kg), SpO2 96%. Body mass index is 40.79 kg/m.  General: Well Developed, well nourished, and in no acute distress.  HEENT: Normocephalic, atraumatic; EOMI, sclerae are anicteric. Skin: Warm and dry, good turgor Chest:  Normal excursion, shape, no gross ABN Respiratory: No conversational dyspnea; speaking in full sentences NeuroM-Sk:  Normal gross ROM * 4 extremities  Psych: A and O X 3, insight adequate, mood- full    DIAGNOSTIC DATA REVIEWED:  BMET    Component Value Date/Time   NA 142 02/03/2024 1058   NA 141 05/10/2013 1441   K 4.2 02/03/2024 1058   K 4.1 05/10/2013 1441   CL 104 02/03/2024 1058   CO2 23 02/03/2024 1058  CO2 24 05/10/2013 1441   GLUCOSE 87 02/03/2024 1058   GLUCOSE 84 10/12/2023 1243   GLUCOSE 87 05/10/2013 1441   BUN 14 02/03/2024 1058   BUN 15.0 05/10/2013 1441   CREATININE 0.80 02/03/2024 1058   CREATININE 0.79 04/20/2023 1042   CREATININE 0.8 05/10/2013 1441   CALCIUM  9.0 02/03/2024 1058   CALCIUM  9.9 05/10/2013 1441   GFRNONAA >60 02/15/2021 1116   GFRNONAA 90 10/01/2020 1503   GFRAA 104 10/01/2020 1503   Lab Results  Component Value Date   HGBA1C 6.0 10/12/2023   HGBA1C 5.9 (H) 04/20/2013   Lab Results  Component  Value Date   INSULIN  17.2 02/03/2024   Lab Results  Component Value Date   TSH 2.30 04/20/2023   CBC    Component Value Date/Time   WBC 5.8 10/12/2023 1243   RBC 4.82 10/12/2023 1243   HGB 13.7 10/12/2023 1243   HGB 12.2 02/15/2021 1116   HGB 13.7 05/10/2013 1441   HCT 42.9 10/12/2023 1243   HCT 41.4 05/10/2013 1441   PLT 209.0 10/12/2023 1243   PLT 226 02/15/2021 1116   PLT 187 05/10/2013 1441   MCV 89.1 10/12/2023 1243   MCV 89.3 05/10/2013 1441   MCH 26.7 (L) 04/20/2023 1042   MCHC 31.9 10/12/2023 1243   RDW 14.8 10/12/2023 1243   RDW 13.6 05/10/2013 1441   Iron  Studies    Component Value Date/Time   IRON  97 10/12/2023 1613   TIBC 452.2 (H) 10/12/2023 1613   FERRITIN 15.4 10/12/2023 1613   IRONPCTSAT 21.5 10/12/2023 1613   IRONPCTSAT 18 09/22/2022 1141   Lipid Panel     Component Value Date/Time   CHOL 217 (H) 10/12/2023 1243   TRIG 237.0 (H) 10/12/2023 1243   HDL 49.30 10/12/2023 1243   CHOLHDL 4 10/12/2023 1243   VLDL 47.4 (H) 10/12/2023 1243   LDLCALC 120 (H) 10/12/2023 1243   LDLCALC 75 04/20/2023 1042   Hepatic Function Panel     Component Value Date/Time   PROT 6.7 10/12/2023 1243   PROT 7.6 05/10/2013 1441   ALBUMIN 4.2 10/12/2023 1243   ALBUMIN 4.1 05/10/2013 1441   AST 17 10/12/2023 1243   AST 15 02/15/2021 1116   AST 17 05/10/2013 1441   ALT 15 10/12/2023 1243   ALT 13 02/15/2021 1116   ALT 18 05/10/2013 1441   ALKPHOS 62 10/12/2023 1243   ALKPHOS 73 05/10/2013 1441   BILITOT 0.3 10/12/2023 1243   BILITOT 0.4 02/15/2021 1116   BILITOT 0.33 05/10/2013 1441   BILIDIR 0.1 12/06/2019 1112   IBILI 0.3 12/06/2019 1112      Component Value Date/Time   TSH 2.30 04/20/2023 1042   Nutritional Lab Results  Component Value Date   VD25OH 49.8 02/03/2024   VD25OH 38.86 10/12/2023   VD25OH 57 04/20/2023     ASSESSMENT AND PLAN Class 3 severe obesity with serious comorbidity and body mass index (BMI) of 40.0 to 44.9 in adult, unspecified  obesity type (HCC)  TREATMENT PLAN FOR OBESITY:  Recommended Dietary Goals  Disa is currently in the action stage of change. As such, her goal is to continue weight management plan. She has agreed to the Category 1 Plan.  Behavioral Intervention  We discussed the following Behavioral Modification Strategies today: increasing lean protein intake to established goals, increasing fiber rich foods, avoiding skipping meals, increasing water intake , work on meal planning and preparation, better snacking choices, celebration eating strategies, and continue to work on maintaining a  reduced calorie state, getting the recommended amount of protein, incorporating whole foods, making healthy choices, staying well hydrated and practicing mindfulness when eating.. Celebration Eating Strategies: She plans to maintain her weight in December - She wants to have small indulgences, but she is not going to waste her calories on things that are not wonderful. - Outside of social situations she will continue to follow a structured eating plan but enjoy herself with portion control for holiday events, parties and get-togethers - She does recognize that this strategy is to help her maintain her weight, not lose weight and in January she will return to a structured plan for weight loss   Recommended Physical Activity Goals  Allianna has been advised to work up to 150 minutes of moderate intensity aerobic activity a week and strengthening exercises 2-3 times per week for cardiovascular health, weight loss maintenance and preservation of muscle mass.   She has agreed to Patient will begin to exercise by doing chair exercises 2 days a week and Increase physical activity in their day and reduce sedentary time (increase NEAT).   Pharmacotherapy We discussed various medication options to help Gray with her weight loss efforts and we both agreed to Lomaira  and topiramate  for weight loss prescribed through her PCP- denies  side effects.  ASSOCIATED CONDITIONS ADDRESSED TODAY  Action/Plan  Essential hypertension Continue Atenolol  25 mg every day Continue Category 1 meal plan  and DASH diet Monitor BP and if consistently >140/90 notify PCP If develops headaches, chest pain, shortness of breath or dizziness go to ER Continue to follow regularly with PCP.   Insulin  resistance Prediabetes Continue Category 1  meal plan, limit simple carbohydrates, increase lean protein/fiber/water Continue to follow regularly with PCP Strongly encouraged to start exercise and has agreed to start chair exercises 2 days a week  Hyperlipidemia, mixed Focus on implementing category 1 meal plan, limit saturated fats, increase lean protein/fiber and water Continue Rosuvastatin  20 mg every day - denies side effects Strongly encouraged to start exercise and has agreed to start chair exercises 2 days a week Continue to follow regularly with PCP  Vitamin D  deficiency Low vitamin D  levels can be associated with adiposity and may result in leptin resistance and weight gain. Also associated with fatigue.  Currently on vitamin D3 5000 units daily supplementation without any adverse effects such as nausea, vomiting or muscle weakness.            Return in about 5 weeks (around 06/09/2024) for due to holidays.. She was informed of the importance of frequent follow up visits to maximize her success with intensive lifestyle modifications for her multiple health conditions.   ATTESTASTION STATEMENTS:  Reviewed by clinician on day of visit: allergies, medications, problem list, medical history, surgical history, family history, social history, and previous encounter notes.   I personally spent a total of 34 minutes in the care of the patient today including preparing to see the patient, getting/reviewing separately obtained history, performing a medically appropriate exam/evaluation, counseling and educating, and documenting clinical  information in the EHR.   Ki Luckman ANP-C

## 2024-05-05 NOTE — Addendum Note (Signed)
 Addended by: Kaige Whistler E on: 05/05/2024 04:45 PM   Modules accepted: Level of Service

## 2024-05-11 ENCOUNTER — Other Ambulatory Visit: Payer: Self-pay | Admitting: Family

## 2024-05-11 ENCOUNTER — Ambulatory Visit: Attending: Rheumatology | Admitting: Rheumatology

## 2024-05-11 ENCOUNTER — Encounter: Payer: Self-pay | Admitting: Rheumatology

## 2024-05-11 VITALS — BP 122/71 | HR 54 | Temp 97.6°F | Resp 15 | Ht 62.0 in | Wt 228.0 lb

## 2024-05-11 DIAGNOSIS — E538 Deficiency of other specified B group vitamins: Secondary | ICD-10-CM

## 2024-05-11 DIAGNOSIS — M3509 Sicca syndrome with other organ involvement: Secondary | ICD-10-CM

## 2024-05-11 DIAGNOSIS — J4520 Mild intermittent asthma, uncomplicated: Secondary | ICD-10-CM | POA: Diagnosis not present

## 2024-05-11 DIAGNOSIS — K219 Gastro-esophageal reflux disease without esophagitis: Secondary | ICD-10-CM

## 2024-05-11 DIAGNOSIS — M1711 Unilateral primary osteoarthritis, right knee: Secondary | ICD-10-CM | POA: Diagnosis not present

## 2024-05-11 DIAGNOSIS — E559 Vitamin D deficiency, unspecified: Secondary | ICD-10-CM

## 2024-05-11 DIAGNOSIS — M19041 Primary osteoarthritis, right hand: Secondary | ICD-10-CM | POA: Diagnosis not present

## 2024-05-11 DIAGNOSIS — F5101 Primary insomnia: Secondary | ICD-10-CM

## 2024-05-11 DIAGNOSIS — E782 Mixed hyperlipidemia: Secondary | ICD-10-CM

## 2024-05-11 DIAGNOSIS — R41840 Attention and concentration deficit: Secondary | ICD-10-CM

## 2024-05-11 DIAGNOSIS — I10 Essential (primary) hypertension: Secondary | ICD-10-CM

## 2024-05-11 DIAGNOSIS — M810 Age-related osteoporosis without current pathological fracture: Secondary | ICD-10-CM | POA: Diagnosis not present

## 2024-05-11 DIAGNOSIS — D508 Other iron deficiency anemias: Secondary | ICD-10-CM | POA: Diagnosis not present

## 2024-05-11 DIAGNOSIS — M19072 Primary osteoarthritis, left ankle and foot: Secondary | ICD-10-CM

## 2024-05-11 DIAGNOSIS — G8929 Other chronic pain: Secondary | ICD-10-CM

## 2024-05-11 DIAGNOSIS — M19071 Primary osteoarthritis, right ankle and foot: Secondary | ICD-10-CM

## 2024-05-11 DIAGNOSIS — G479 Sleep disorder, unspecified: Secondary | ICD-10-CM

## 2024-05-11 DIAGNOSIS — M546 Pain in thoracic spine: Secondary | ICD-10-CM | POA: Diagnosis not present

## 2024-05-11 DIAGNOSIS — M19042 Primary osteoarthritis, left hand: Secondary | ICD-10-CM

## 2024-05-11 DIAGNOSIS — F419 Anxiety disorder, unspecified: Secondary | ICD-10-CM

## 2024-05-11 NOTE — Patient Instructions (Addendum)
 Exercises for Chronic Knee Pain Chronic knee pain is pain that lasts longer than 3 months. For most people with chronic knee pain, exercise and weight loss is an important part of treatment. Your health care provider may want you to focus on: Making the muscles that support your knee stronger. This can take pressure off your knee and reduce pain. Preventing knee stiffness. How far you can move your knee, keeping it there or making it farther. Losing weight (if this applies) to take pressure off your knee, lower your risk for injury, and make it easier for you to exercise. Your provider will help you make an exercise program that fits your needs and physical abilities. Below are simple, low-impact exercises you can do at home. Ask your provider or physical therapist how often you should do your exercise program and how many times to repeat each exercise. General safety tips  Get your provider's approval before doing any exercises. Start slowly and stop any time you feel pain. Do not exercise if your knee pain is flaring up. Warm up first. Stretching a cold muscle can cause an injury. Do 5-10 minutes of easy movement or light stretching before beginning your exercises. Do 5-10 minutes of low-impact activity (like walking or cycling) before starting strengthening exercises. Contact your provider any time you have pain during or after exercising. Exercise can cause discomfort but should not be painful. It is normal to be a little stiff or sore after exercising. Stretching and range-of-motion exercises Front thigh stretch  Stand up straight and support your body by holding on to a chair or resting one hand on a wall. With your legs straight and close together, bend one knee to lift your heel up toward your butt. Using one hand for support, grab your ankle with your free hand. Pull your foot up closer toward your butt to feel the stretch in front of your thigh. Hold the stretch for 30  seconds. Repeat __________ times. Complete this exercise __________ times a day. Back thigh stretch  Sit on the floor with your back straight and your legs out straight in front of you. Place the palms of your hands on the floor and slide them toward your feet as you bend at the hip. Try to touch your nose to your knees and feel the stretch in the back of your thighs. Hold for 30 seconds. Repeat __________ times. Complete this exercise __________ times a day. Calf stretch  Stand facing a wall. Place the palms of your hands flat against the wall, arms extended, and lean slightly against the wall. Get into a lunge position with one leg bent at the knee and the other leg stretched out straight behind you. Keep both feet facing the wall and increase the bend in your knee while keeping the heel of the other leg flat on the ground. You should feel the stretch in your calf. Hold for 30 seconds. Repeat __________ times. Complete this exercise __________ times a day. Strengthening exercises Straight leg lift  Lie on your back with one knee bent and the other leg out straight. Slowly lift the straight leg without bending the knee. Lift until your foot is about 12 inches (30 cm) off the floor. Hold for 3-5 seconds and slowly lower your leg. Repeat __________ times. Complete this exercise __________ times a day. Single leg dip  Stand between two chairs and put both hands on the backs of the chairs for support. Extend one leg out straight with your body  weight resting on the heel of the standing leg. Slowly bend your standing knee to dip your body to the level that is comfortable for you. Hold for 3-5 seconds. Repeat __________ times. Complete this exercise __________ times a day. Hamstring curls  Stand straight, knees close together, facing the back of a chair. Hold on to the back of a chair with both hands. Keep one leg straight. Bend the other knee while bringing the heel up toward the butt  until the knee is bent at a 90-degree angle (right angle). Hold for 3-5 seconds. Repeat __________ times. Complete this exercise __________ times a day. Wall squat  Stand straight with your back, hips, and head against a wall. Step forward one foot at a time with your back still against the wall. Your feet should be 2 feet (61 cm) from the wall at shoulder width. Keeping your back, hips, and head against the wall, slide down the wall to as close to a sitting position as you can get. Hold for 5-10 seconds, then slowly slide back up. Repeat __________ times. Complete this exercise __________ times a day. Step-ups  Stand in front of a sturdy platform or stool that is about 6 inches (15 cm) high. Slowly step up with your left / right foot, keeping your knee in line with your hip and foot. Do not let your knee bend so far that you cannot see your toes. Hold on to a chair for balance, but do not use it for support. Slowly unlock your knee and lower yourself to the starting position. Repeat __________ times. Complete this exercise __________ times a day. Contact a health care provider if: Your exercises cause pain. Your pain is worse after you exercise. Your pain prevents you from doing your exercises. This information is not intended to replace advice given to you by your health care provider. Make sure you discuss any questions you have with your health care provider. Document Revised: 06/03/2022 Document Reviewed: 06/03/2022 [employer] Patient Education  2024 [employer]Hand Exercises Hand exercises can be helpful for almost anyone. They can strengthen your hands and improve flexibility and movement. The exercises can also increase blood flow to the hands. These results can make your work and daily tasks easier for you. Hand exercises can be especially helpful for people who have joint pain from arthritis or nerve damage from using their hands over and over. These exercises can also help people  who injure a hand. Exercises Most of these hand exercises are gentle stretching and motion exercises. It is usually safe to do them often throughout the day. Warming up your hands before exercise may help reduce stiffness. You can do this with gentle massage or by placing your hands in warm water for 10-15 minutes. It is normal to feel some stretching, pulling, tightness, or mild discomfort when you begin new exercises. In time, this will improve. Remember to always be careful and stop right away if you feel sudden, very bad pain or your pain gets worse. You want to get better and be safe. Ask your health care provider which exercises are safe for you. Do exercises exactly as told by your provider and adjust them as told. Do not begin these exercises until told by your provider. Knuckle bend or claw fist  Stand or sit with your arm, hand, and all five fingers pointed straight up. Make sure to keep your wrist straight. Gently bend your fingers down toward your palm until the tips of your fingers are  touching your palm. Keep your big knuckle straight and only bend the small knuckles in your fingers. Hold this position for 10 seconds. Straighten your fingers back to your starting position. Repeat this exercise 5-10 times with each hand. Full finger fist  Stand or sit with your arm, hand, and all five fingers pointed straight up. Make sure to keep your wrist straight. Gently bend your fingers into your palm until the tips of your fingers are touching the middle of your palm. Hold this position for 10 seconds. Extend your fingers back to your starting position, stretching every joint fully. Repeat this exercise 5-10 times with each hand. Straight fist  Stand or sit with your arm, hand, and all five fingers pointed straight up. Make sure to keep your wrist straight. Gently bend your fingers at the big knuckle, where your fingers meet your hand, and at the middle knuckle. Keep the knuckle at the  tips of your fingers straight and try to touch the bottom of your palm. Hold this position for 10 seconds. Extend your fingers back to your starting position, stretching every joint fully. Repeat this exercise 5-10 times with each hand. Tabletop  Stand or sit with your arm, hand, and all five fingers pointed straight up. Make sure to keep your wrist straight. Gently bend your fingers at the big knuckle, where your fingers meet your hand, as far down as you can. Keep the small knuckles in your fingers straight. Think of forming a tabletop with your fingers. Hold this position for 10 seconds. Extend your fingers back to your starting position, stretching every joint fully. Repeat this exercise 5-10 times with each hand. Finger spread  Place your hand flat on a table with your palm facing down. Make sure your wrist stays straight. Spread your fingers and thumb apart from each other as far as you can until you feel a gentle stretch. Hold this position for 10 seconds. Bring your fingers and thumb tight together again. Hold this position for 10 seconds. Repeat this exercise 5-10 times with each hand. Making circles  Stand or sit with your arm, hand, and all five fingers pointed straight up. Make sure to keep your wrist straight. Make a circle by touching the tip of your thumb to the tip of your index finger. Hold for 10 seconds. Then open your hand wide. Repeat this motion with your thumb and each of your fingers. Repeat this exercise 5-10 times with each hand. Thumb motion  Sit with your forearm resting on a table and your wrist straight. Your thumb should be facing up toward the ceiling. Keep your fingers relaxed as you move your thumb. Lift your thumb up as high as you can toward the ceiling. Hold for 10 seconds. Bend your thumb across your palm as far as you can, reaching the tip of your thumb for the small finger (pinkie) side of your palm. Hold for 10 seconds. Repeat this exercise 5-10  times with each hand. Grip strengthening  Hold a stress ball or other soft ball in the middle of your hand. Slowly increase the pressure, squeezing the ball as much as you can without causing pain. Think of bringing the tips of your fingers into the middle of your palm. All of your finger joints should bend when doing this exercise. Hold your squeeze for 10 seconds, then relax. Repeat this exercise 5-10 times with each hand. Contact a health care provider if: Your hand pain or discomfort gets much worse when you do  an exercise. Your hand pain or discomfort does not improve within 2 hours after you exercise. If you have either of these problems, stop doing these exercises right away. Do not do them again unless your provider says that you can. Get help right away if: You develop sudden, severe hand pain or swelling. If this happens, stop doing these exercises right away. Do not do them again unless your provider says that you can. This information is not intended to replace advice given to you by your health care provider. Make sure you discuss any questions you have with your health care provider. Document Revised: 06/03/2022 Document Reviewed: 06/03/2022 [employer] Patient Education  2024 [employer]Low Back Sprain or Strain Rehab Ask your health care provider which exercises are safe for you. Do exercises exactly as told by your health care provider and adjust them as directed. It is normal to feel mild stretching, pulling, tightness, or discomfort as you do these exercises. Stop right away if you feel sudden pain or your pain gets worse. Do not begin these exercises until told by your health care provider. Stretching and range-of-motion exercises These exercises warm up your muscles and joints and improve the movement and flexibility of your back. These exercises also help to relieve pain, numbness, and tingling. Lumbar rotation  Lie on your back on a firm bed or the floor with your knees  bent. Straighten your arms out to your sides so each arm forms a 90-degree angle (right angle) with a side of your body. Slowly move (rotate) both of your knees to one side of your body until you feel a stretch in your lower back (lumbar). Try not to let your shoulders lift off the floor. Hold this position for __________ seconds. Tense your abdominal muscles and slowly move your knees back to the starting position. Repeat this exercise on the other side of your body. Repeat __________ times. Complete this exercise __________ times a day. Single knee to chest  Lie on your back on a firm bed or the floor with both legs straight. Bend one of your knees. Use your hands to move your knee up toward your chest until you feel a gentle stretch in your lower back and buttock. Hold your leg in this position by holding on to the front of your knee. Keep your other leg as straight as possible. Hold this position for __________ seconds. Slowly return to the starting position. Repeat with your other leg. Repeat __________ times. Complete this exercise __________ times a day. Prone extension on elbows  Lie on your abdomen on a firm bed or the floor (prone position). Prop yourself up on your elbows. Use your arms to help lift your chest up until you feel a gentle stretch in your abdomen and your lower back. This will place some of your body weight on your elbows. If this is uncomfortable, try stacking pillows under your chest. Your hips should stay down, against the surface that you are lying on. Keep your hip and back muscles relaxed. Hold this position for __________ seconds. Slowly relax your upper body and return to the starting position. Repeat __________ times. Complete this exercise __________ times a day. Strengthening exercises These exercises build strength and endurance in your back. Endurance is the ability to use your muscles for a long time, even after they get tired. Pelvic tilt This  exercise strengthens the muscles that lie deep in the abdomen. Lie on your back on a firm bed or the floor with  your legs extended. Bend your knees so they are pointing toward the ceiling and your feet are flat on the floor. Tighten your lower abdominal muscles to press your lower back against the floor. This motion will tilt your pelvis so your tailbone points up toward the ceiling instead of pointing to your feet or the floor. To help with this exercise, you may place a small towel under your lower back and try to push your back into the towel. Hold this position for __________ seconds. Let your muscles relax completely before you repeat this exercise. Repeat __________ times. Complete this exercise __________ times a day. Alternating arm and leg raises  Get on your hands and knees on a firm surface. If you are on a hard floor, you may want to use padding, such as an exercise mat, to cushion your knees. Line up your arms and legs. Your hands should be directly below your shoulders, and your knees should be directly below your hips. Lift your left leg behind you. At the same time, raise your right arm and straighten it in front of you. Do not lift your leg higher than your hip. Do not lift your arm higher than your shoulder. Keep your abdominal and back muscles tight. Keep your hips facing the ground. Do not arch your back. Keep your balance carefully, and do not hold your breath. Hold this position for __________ seconds. Slowly return to the starting position. Repeat with your right leg and your left arm. Repeat __________ times. Complete this exercise __________ times a day. Abdominal set with straight leg raise  Lie on your back on a firm bed or the floor. Bend one of your knees and keep your other leg straight. Tense your abdominal muscles and lift your straight leg up, 4-6 inches (10-15 cm) off the ground. Keep your abdominal muscles tight and hold this position for __________  seconds. Do not hold your breath. Do not arch your back. Keep it flat against the ground. Keep your abdominal muscles tense as you slowly lower your leg back to the starting position. Repeat with your other leg. Repeat __________ times. Complete this exercise __________ times a day. Single leg lower with bent knees Lie on your back on a firm bed or the floor. Tense your abdominal muscles and lift your feet off the floor, one foot at a time, so your knees and hips are bent in 90-degree angles (right angles). Your knees should be over your hips and your lower legs should be parallel to the floor. Keeping your abdominal muscles tense and your knee bent, slowly lower one of your legs so your toe touches the ground. Lift your leg back up to return to the starting position. Do not hold your breath. Do not let your back arch. Keep your back flat against the ground. Repeat with your other leg. Repeat __________ times. Complete this exercise __________ times a day. Posture and body mechanics Good posture and healthy body mechanics can help to relieve stress in your body's tissues and joints. Body mechanics refers to the movements and positions of your body while you do your daily activities. Posture is part of body mechanics. Good posture means: Your spine is in its natural S-curve position (neutral). Your shoulders are pulled back slightly. Your head is not tipped forward (neutral). Follow these guidelines to improve your posture and body mechanics in your everyday activities. Standing  When standing, keep your spine neutral and your feet about hip-width apart. Keep a slight bend  in your knees. Your ears, shoulders, and hips should line up. When you do a task in which you stand in one place for a long time, place one foot up on a stable object that is 2-4 inches (5-10 cm) high, such as a footstool. This helps keep your spine neutral. Sitting  When sitting, keep your spine neutral and keep your  feet flat on the floor. Use a footrest, if necessary, and keep your thighs parallel to the floor. Avoid rounding your shoulders, and avoid tilting your head forward. When working at a desk or a computer, keep your desk at a height where your hands are slightly lower than your elbows. Slide your chair under your desk so you are close enough to maintain good posture. When working at a computer, place your monitor at a height where you are looking straight ahead and you do not have to tilt your head forward or downward to look at the screen. Resting When lying down and resting, avoid positions that are most painful for you. If you have pain with activities such as sitting, bending, stooping, or squatting, lie in a position in which your body does not bend very much. For example, avoid curling up on your side with your arms and knees near your chest (fetal position). If you have pain with activities such as standing for a long time or reaching with your arms, lie with your spine in a neutral position and bend your knees slightly. Try the following positions: Lying on your side with a pillow between your knees. Lying on your back with a pillow under your knees. Lifting  When lifting objects, keep your feet at least shoulder-width apart and tighten your abdominal muscles. Bend your knees and hips and keep your spine neutral. It is important to lift using the strength of your legs, not your back. Do not lock your knees straight out. Always ask for help to lift heavy or awkward objects. This information is not intended to replace advice given to you by your health care provider. Make sure you discuss any questions you have with your health care provider. Document Revised: 09/22/2022 Document Reviewed: 08/06/2020 [employer] Patient Education  2024 [employer]Thoracic Strain Rehab Ask your health care provider which exercises are safe for you. Do exercises exactly as told by your provider and adjust them as  directed. It is normal to feel mild stretching, pulling, tightness, or discomfort as you do these exercises. Stop right away if you feel sudden pain or your pain gets worse. Do not begin these exercises until told by your provider. Stretching and range-of-motion exercise This exercise warms up your muscles and joints and improves the movement and flexibility of your back and shoulders. This exercise also helps to relieve pain. Chest and spine stretch  Lie down on your back on a firm surface. Roll a towel or a small blanket so it is about 4 inches (10 cm) in diameter. Put the towel under the middle of your back so it is under your spine, but not under your shoulder blades. Put your hands behind your head and let your elbows fall to your sides. This will increase your stretch. Take a deep breath (inhale). Hold for __________ seconds. Relax after you breathe out (exhale). Repeat __________ times. Complete this exercise __________ times a day. Strengthening exercises These exercises build strength and endurance in your back and your shoulder blade muscles. Endurance is the ability to use your muscles for a long time, even after they  get tired. Alternating arm and leg raises  Get on your hands and knees on a firm surface. If you are on a hard floor, you may want to use padding, such as an exercise mat, to cushion your knees. Line up your arms and legs. Your hands should be directly below your shoulders, and your knees should be directly below your hips. Lift your left leg behind you. At the same time, raise your right arm and straighten it in front of you. Do not lift your leg higher than your hip. Do not lift your arm higher than your shoulder. Keep your abdominal and back muscles tight. Keep your hips facing the ground. Do not arch your back. Carefully stay balanced. Do not hold your breath. Hold for __________ seconds. Slowly return to the starting position and repeat with your right leg and  your left arm. Repeat __________ times. Complete this exercise __________ times a day. Straight arm rows This exercise is also called the shoulder extension exercise. Stand with your feet shoulder width apart. Secure an exercise band to a stable object in front of you so the band is at or above shoulder height. Hold one end of the exercise band in each hand. Straighten your elbows and lift your hands up to shoulder height. Step back, away from the secured end of the exercise band, until the band stretches. Squeeze your shoulder blades together and pull your hands down to the sides of your thighs. Stop when your hands are straight down by your sides. This is shoulder extension. Do not let your hands go behind your body. Hold for __________ seconds. Slowly return to the starting position. Repeat __________ times. Complete this exercise __________ times a day. Rowing scapular retraction This is an exercise in which the shoulder blades (scapulae) are pulled toward each other (retraction). Sit in a stable chair without armrests, or stand up. Secure an exercise band to a stable object in front of you so the band is at shoulder height. Hold one end of the exercise band in each hand. Your palms should face toward each other. Bring your arms out straight in front of you. Step back, away from the secured end of the exercise band, until the band stretches. Pull the band backward. As you do this, bend your elbows and squeeze your shoulder blades together, but avoid letting the rest of your body move. Do not shrug your shoulders upward while you do this. Stop when your elbows are at your sides or slightly behind your body. Hold for __________ seconds. Slowly straighten your arms to return to the starting position. Repeat __________ times. Complete this exercise __________ times a day. Posture and body mechanics Good posture and healthy body mechanics can help to relieve stress in your body's tissues and  joints. Body mechanics refers to the movements and positions of your body while you do your daily activities. Posture is part of body mechanics. Good posture means: Your spine is in its natural S-curve position (neutral). Your shoulders are pulled back slightly. Your head is not tipped forward. Follow these guidelines to improve your posture and body mechanics in your everyday activities. Standing  When standing, keep your spine neutral and your feet about hip width apart. Keep a slight bend in your knees. Your ears, shoulders, and hips should line up with each other. When you do a task in which you lean forward while standing in one place for a long time, place one foot up on a stable object that  is 2-4 inches (5-10 cm) high, such as a footstool. This helps keep your spine neutral. Sitting  When sitting, keep your spine neutral and keep your feet flat on the floor. Use a footrest if needed. Keep your thighs parallel to the floor. Avoid rounding your shoulders, and avoid tilting your head forward. When working at a desk or a computer, keep your desk at a height where your hands are slightly lower than your elbows. Slide your chair under your desk so you are close enough to maintain good posture. When working at a computer, place your monitor at a height where you are looking straight ahead and you do not have to tilt your head forward or downward to look at the screen. Resting When lying down and resting, avoid positions that are most painful for you. If you have pain with activities such as sitting, bending, stooping, or squatting (flexion-basedactivities), lie in a position in which your body does not bend very much. For example, avoid curling up on your side with your arms and knees near your chest (fetal position). If you have pain with activities such as standing for a long time or reaching with your arms (extension-basedactivities), lie with your spine in a neutral position and bend your knees  slightly. Try the following positions: Lie on your side with a pillow between your knees. Lie on your back with a pillow under your knees.  Lifting  When lifting objects, keep your feet at least shoulder width apart and tighten your abdominal muscles. Bend your knees and hips and keep your spine neutral. It is important to lift using the strength of your legs, not your back. Do not lock your knees straight out. Always ask for help to lift heavy or awkward objects. This information is not intended to replace advice given to you by your health care provider. Make sure you discuss any questions you have with your health care provider. Document Revised: 10/31/2022 Document Reviewed: 01/06/2022 Elsevier Patient Education  2024 [employer]  Sjogren's Syndrome Sjgren's syndrome is an inflammatory disease in which the body's disease-fighting system (immune system) attacks the glands that produce tears (lacrimal glands) and the glands that produce saliva (salivary glands). This makes the eyes and mouth very dry. Sjgren's syndrome can also affect other parts of the body, causing dryness of the skin, nose, throat, and vagina. Sjgren's syndrome is a long-term (chronic) disorder that has no cure. In some cases, it is linked to other disorders (rheumatic disorders), such as rheumatoid arthritis and systemic lupus erythematosus (SLE). It may affect other parts of the body, such as the: Blood vessels. Joints. Lungs. Kidneys. Liver or pancreas. Brain, nerves, or spinal cord. What are the causes? The cause of this condition is not known. It may be passed along from parent to child (inherited), or it may be a symptom of a rheumatic disorder. What increases the risk? This condition is more likely to develop in: Women. People who are 63-18 years old and older. People who have recently had a viral infection or currently have a viral infection. What are the signs or symptoms? The main symptoms of this  condition are: Dry mouth. This may include: A chalky feeling. Difficulty swallowing, speaking, or tasting. Frequent cavities in the teeth. Frequent mouth infections. Dry eyes. This may include: Burning, redness, and itching. Blurry vision. Fluctuating vision. Light sensitivity. Other symptoms may include: Dryness of the skin and the inside of the nose. Eyelid infections. Vaginal dryness (if applicable). Joint pain and stiffness.  Muscle pain and stiffness. How is this diagnosed? This condition is diagnosed based on: Your symptoms. Your medical history. A physical exam of your eyes and mouth. Tests, including: A Schirmer test. This tests your tear production. An eye exam that is done with a magnifying device (slit-lamp exam). An eye test that temporarily stains your eye with special dyes. This shows the extent of eye damage. Tests to check your salivary gland function. Biopsy. This is a removal of part of a salivary gland from inside your lower lip to be studied under a microscope. Chest X-rays. Blood or urine tests. How is this treated? There is no cure for this condition, but treatment can help you manage your symptoms. You may be asked to see a rheumatologist for further evaluation and treatment. This condition may be treated with: Medicines to help relieve pain and stiffness. Medicines to help relieve inflammation in your body (corticosteroids). These are usually for severe cases. Medicines to help reduce the activity of your immune system (immunosuppressants). These are usually prescribed by your health care provider or a rheumatologist. Moisture replacement therapies to help relieve dryness in your skin, mouth, and eyes. Dry eyes may be treated with: Eye drops or nasal sprays to improve dryness of the eyes. Surgery or insertion of plugs to close the lacrimal glands (punctal occlusion). This helps keep more natural tears in your eyes. Soft contact lenses or hard scleral  lenses. These are occasionally used to protect the surface of the eye. Biologic lubricating eye drops (serum tears). These are eye drops made from a person's own blood. They are used in some people with severe dry eye. Follow these instructions at home: Eye care  Use eye drops and other medicines as told by your health care provider. Protect your eyes from the sun and wind with sunglasses or glasses. Blink at least 5-6 times a minute. Maintain properly humidified air. You may want to use a humidifier at home and at work. Avoid smoke. Mouth care Brush your teeth and floss after every meal. Chew sugar-free gum or suck on hard candy. This may help to relieve dry mouth. Use antimicrobial mouthwash daily. Take frequent sips of water or sugar-free drinks. Use saliva substitutes or lip balm as told by your health care provider. See your dentist every 6 months. General instructions  Take over-the-counter and prescription medicines only as told by your health care provider. Drink enough fluid to keep your urine pale yellow. Keep all follow-up visits. This is important. Contact a health care provider if: You have a fever. You have night sweats. You are always tired. You have unexplained weight loss. You develop itchy skin. You have red patches on your skin. You have a lump or swelling on your neck. Get help right away if: You develop severe eye pain. You develop sudden decreased vision. Summary Sjgren's syndrome is a disease in which the body's immune system attacks the glands that produce tears and the glands that produce saliva. This condition makes the eyes and mouth very dry. Sjgren's syndrome is a long-term (chronic) disorder. There is no cure for this condition, but treatment can help you manage your symptoms. The cause of this condition is not known. You may be asked to see a rheumatologist for further evaluation and treatment. This information is not intended to replace advice  given to you by your health care provider. Make sure you discuss any questions you have with your health care provider. Document Revised: 12/17/2020 Document Reviewed: 12/17/2020 Elsevier Patient Education  2024 [employer].  Osteoarthritis  Osteoarthritis is a type of arthritis. It refers to joint pain or joint disease. Osteoarthritis affects tissue that covers the ends of bones in joints (cartilage). Cartilage acts as a cushion between the bones and helps them move smoothly. Osteoarthritis occurs when cartilage in the joints gets worn down. Osteoarthritis is sometimes called wear and tear arthritis. Osteoarthritis is the most common form of arthritis. It often occurs in older people. It is a condition that gets worse over time. The joints most often affected by this condition are in the fingers, toes, hips, knees, and spine, including the neck and lower back. What are the causes? This condition is caused by the wearing down of cartilage that covers the ends of bones. What increases the risk? The following factors may make you more likely to develop this condition: Being age 20 or older. Obesity. Overuse of joints. Past injury of a joint. Past surgery on a joint. Family history of osteoarthritis. What are the signs or symptoms? The main symptoms of this condition are pain, swelling, and stiffness in the joint. Other symptoms may include: An enlarged joint. More pain and further damage caused by small pieces of bone or cartilage that break off and float inside of the joint. Small deposits of bone (osteophytes) that grow on the edges of the joint. A grating or scraping feeling inside the joint when you move it. Popping or creaking sounds when you move. Difficulty walking or exercising. An inability to grip items, twist your hand, or control the movements of your hands and fingers. How is this diagnosed? This condition may be diagnosed based on: Your medical history. A physical  exam. Your symptoms. X-rays of the affected joints. Blood tests to rule out other types of arthritis. How is this treated? There is no cure for this condition, but treatment can help control pain and improve joint function. Treatment may include a combination of therapies, such as: Pain relief techniques, such as: Applying heat and cold to the joint. Massage. A form of talk therapy called cognitive behavioral therapy (CBT). This therapy helps you set goals and follow up on the changes that you make. Medicines for pain and inflammation. The medicines can be taken by mouth or applied to the skin. They include: NSAIDs, such as ibuprofen. Prescription medicines. Strong anti-inflammatory medicines (corticosteroids). Certain nutritional supplements. A prescribed exercise program. You may work with a physical therapist. Assistive devices, such as a brace, wrap, splint, specialized glove, or cane. A weight control plan. Surgery, such as: An osteotomy. This is done to reposition the bones and relieve pain or to remove loose pieces of bone and cartilage. Joint replacement surgery. You may need this surgery if you have advanced osteoarthritis. Follow these instructions at home: Activity Rest your affected joints as told by your health care provider. Exercise as told by your provider. The provider may recommend specific types of exercise, such as: Strengthening exercises. These are done to strengthen the muscles that support joints affected by arthritis. Aerobic activities. These are exercises, such as brisk walking or water aerobics, that increase your heart rate. Range-of-motion activities. These help your joints move more easily. Balance and agility exercises. Managing pain, stiffness, and swelling     If told, apply heat to the affected area as often as told by your provider. Use the heat source that your provider recommends, such as a moist heat pack or a heating pad. If you have a  removable assistive device, remove it as told  by your provider. Place a towel between your skin and the heat source. If your provider tells you to keep the assistive device on while you apply heat, place a towel between the assistive device and the heat source. Leave the heat on for 20-30 minutes. If told, put ice on the affected area. If you have a removable assistive device, remove it as told by your provider. Put ice in a plastic bag. Place a towel between your skin and the bag. If your provider tells you to keep the assistive device on during icing, place a towel between the assistive device and the bag. Leave the ice on for 20 minutes, 2-3 times a day. If your skin turns bright red, remove the ice or heat right away to prevent skin damage. The risk of damage is higher if you cannot feel pain, heat, or cold. Move your fingers or toes often to reduce stiffness and swelling. Raise (elevate) the affected area above the level of your heart while you are sitting or lying down. General instructions Take over-the-counter and prescription medicines only as told by your provider. Maintain a healthy weight. Follow instructions from your provider for weight control. Do not use any products that contain nicotine or tobacco. These products include cigarettes, chewing tobacco, and vaping devices, such as e-cigarettes. If you need help quitting, ask your provider. Use assistive devices as told by your provider. Where to find more information [employer] of Arthritis and Musculoskeletal and Skin Diseases: niams.http://www.myers.net/ [employer] on Aging: baseringtones.pl American College of Rheumatology: rheumatology.org Contact a health care provider if: You have redness, swelling, or a feeling of warmth in a joint that gets worse. You have a fever along with joint or muscle aches. You develop a rash. You have trouble doing your normal activities. You have pain that gets worse and is not relieved by pain  medicine. This information is not intended to replace advice given to you by your health care provider. Make sure you discuss any questions you have with your health care provider. Document Revised: 01/16/2022 Document Reviewed: 01/16/2022 Elsevier Patient Education  2024 [employer].

## 2024-05-13 ENCOUNTER — Other Ambulatory Visit: Payer: Self-pay | Admitting: Family

## 2024-05-16 MED ORDER — TRAZODONE HCL 50 MG PO TABS: 25.0000 mg | ORAL_TABLET | Freq: Every evening | ORAL | 1 refills | Status: DC | PRN

## 2024-05-16 NOTE — Telephone Encounter (Signed)
 lvm for pt to call office to schedule appt.

## 2024-05-16 NOTE — Addendum Note (Signed)
 Addended by: CORWIN ANTU on: 05/16/2024 03:20 PM   Modules accepted: Orders

## 2024-05-23 ENCOUNTER — Ambulatory Visit: Admitting: Family

## 2024-06-03 ENCOUNTER — Encounter: Payer: Self-pay | Admitting: Hematology

## 2024-06-06 ENCOUNTER — Ambulatory Visit: Admitting: Family

## 2024-06-06 ENCOUNTER — Encounter: Payer: Self-pay | Admitting: Family

## 2024-06-06 VITALS — BP 126/82 | HR 64 | Temp 98.0°F | Ht 62.0 in | Wt 227.0 lb

## 2024-06-06 DIAGNOSIS — Z79899 Other long term (current) drug therapy: Secondary | ICD-10-CM

## 2024-06-06 DIAGNOSIS — I10 Essential (primary) hypertension: Secondary | ICD-10-CM

## 2024-06-06 DIAGNOSIS — R7303 Prediabetes: Secondary | ICD-10-CM | POA: Diagnosis not present

## 2024-06-06 DIAGNOSIS — E782 Mixed hyperlipidemia: Secondary | ICD-10-CM

## 2024-06-06 DIAGNOSIS — G479 Sleep disorder, unspecified: Secondary | ICD-10-CM

## 2024-06-06 LAB — HEPATIC FUNCTION PANEL
ALT: 13 U/L (ref 3–35)
AST: 16 U/L (ref 5–37)
Albumin: 4.2 g/dL (ref 3.5–5.2)
Alkaline Phosphatase: 69 U/L (ref 39–117)
Bilirubin, Direct: 0.1 mg/dL (ref 0.1–0.3)
Total Bilirubin: 0.3 mg/dL (ref 0.2–1.2)
Total Protein: 7.1 g/dL (ref 6.0–8.3)

## 2024-06-06 LAB — HEMOGLOBIN A1C: Hgb A1c MFr Bld: 6 % (ref 4.6–6.5)

## 2024-06-06 LAB — LIPID PANEL
Cholesterol: 159 mg/dL (ref 28–200)
HDL: 52 mg/dL
LDL Cholesterol: 62 mg/dL (ref 10–99)
NonHDL: 107.28
Total CHOL/HDL Ratio: 3
Triglycerides: 224 mg/dL — ABNORMAL HIGH (ref 10.0–149.0)
VLDL: 44.8 mg/dL — ABNORMAL HIGH (ref 0.0–40.0)

## 2024-06-06 MED ORDER — TRAZODONE HCL 50 MG PO TABS: 50.0000 mg | ORAL_TABLET | Freq: Every evening | ORAL | Status: AC | PRN

## 2024-06-06 NOTE — Progress Notes (Signed)
 "  Established Patient Office Visit  Subjective:      CC:  Chief Complaint  Patient presents with   Medical Management of Chronic Issues    HPI: Whitney Herrera is a 69 y.o. female presenting on 06/06/2024 for Medical Management of Chronic Issues .  Discussed the use of AI scribe software for clinical note transcription with the patient, who gave verbal consent to proceed.  History of Present Illness Whitney Herrera is a 69 year old female who presents for a follow-up visit.  She feels stressed and anxious, noting that recent weeks have not been a good measure of her overall mental health. She continues to take Lexapro  20 mg daily for anxiety.  She has a history of asthma and reports improvement in her symptoms since restarting Advair 250/50. No frequent use of her emergency inhaler. She denies having COPD, despite a previous incorrect mention by a healthcare provider. She has a history of pneumonia following a flu infection last year.  She is currently on rosuvastatin  for hyperlipidemia and reports that her cholesterol was high in May.  She takes trazodone  50 mg nightly for sleep and reports consistent use. She also takes atenolol , Zyrtec, and vitamin D .  She has been on phentermine  (Lomaira ) for weight management for several years but reports it is no longer effective. She has lost about ten pounds recently and continues to attend a weight management clinic.  She has a history of arthritis, Sjogren's syndrome, and osteoporosis, with symptoms including dry mouth and joint pain in the right knee, both hands, both feet, and back. She uses meloxicam  as needed for tendinitis in her ankle and heel.  She mentions a previous diagnosis of attention deficit but does not take medication for it.         Wt Readings from Last 3 Encounters:  06/06/24 227 lb (103 kg)  05/11/24 228 lb (103.4 kg)  05/05/24 223 lb (101.2 kg)           Social history:  Relevant past medical,  surgical, family and social history reviewed and updated as indicated. Interim medical history since our last visit reviewed.  Allergies and medications reviewed and updated.  DATA REVIEWED: CHART IN EPIC     ROS: Negative unless specifically indicated above in HPI.   Current Medications[1]        Objective:        BP 126/82 (BP Location: Left Arm, Patient Position: Sitting, Cuff Size: Large)   Pulse 64   Temp 98 F (36.7 C) (Temporal)   Ht 5' 2 (1.575 m)   Wt 227 lb (103 kg)   SpO2 94%   BMI 41.52 kg/m   Physical Exam CHEST: Lungs clear to auscultation bilaterally. CARDIOVASCULAR: Heart sounds normal.  Wt Readings from Last 3 Encounters:  06/06/24 227 lb (103 kg)  05/11/24 228 lb (103.4 kg)  05/05/24 223 lb (101.2 kg)    Physical Exam Vitals reviewed.  Constitutional:      General: She is not in acute distress.    Appearance: Normal appearance. She is obese. She is not ill-appearing, toxic-appearing or diaphoretic.  HENT:     Head: Normocephalic.  Cardiovascular:     Rate and Rhythm: Normal rate and regular rhythm.  Pulmonary:     Effort: Pulmonary effort is normal.     Breath sounds: Normal breath sounds.  Musculoskeletal:        General: Normal range of motion.     Right lower leg: No edema.  Left lower leg: No edema.  Neurological:     General: No focal deficit present.     Mental Status: She is alert and oriented to person, place, and time. Mental status is at baseline.  Psychiatric:        Mood and Affect: Mood normal.        Behavior: Behavior normal.        Thought Content: Thought content normal.        Judgment: Judgment normal.          Results Labs Vitamin D : Within normal limits A1c (11/2023): 6.0 Chol (10/2023): Elevated Kidney function (05/11/2024): Within normal limits  Assessment & Plan:   Assessment and Plan Assessment & Plan Obesity Management with phentermine  and topiramate . Phentermine  has been used for  several years, but benefits are diminishing. Risks of long-term phentermine  use, including potential cardiac issues, outweigh benefits. - Taper off phentermine  by taking it every other day for a week, then discontinue. - Continue topiramate  as prescribed. -cont f/u with medical weight management as scheduled   Mixed hyperlipidemia Managed with rosuvastatin . Cholesterol levels were high in May, and a repeat check is recommended every six months due to statin use. - Ordered cholesterol panel to monitor levels. - Continue rosuvastatin  as prescribed.  Essential hypertension Blood pressure is well-controlled with current medication regimen. - Continue current antihypertensive regimen.  Asthma Symptoms have improved with Advair. No frequent use of emergency inhaler reported. - Continue Advair as prescribed.  Sleep disorder Managed with trazodone  50 mg nightly. - Continue trazodone  50 mg nightly.  Iron  deficiency anemia No specific discussion regarding current status or management.  Polyarthralgia Arthritis symptoms present in multiple joints including right knee, both hands, both feet, and back. Managed with meloxicam  as needed. - Continue meloxicam  as needed for arthritis flare-ups.  Osteoporosis Age-related osteoporosis noted. Potential impact of topiramate  on bone density discussed. - Continue to monitor bone density, especially with topiramate  use.  Prediabetes A1c was last checked seven months ago and is within prediabetic range (5.7 to 6.4). - Continue monitoring A1c levels.  Generalized anxiety disorder Anxiety symptoms discussed, but no acute concerns raised. - Continue current management with escitalopram .        Return in about 6 months (around 12/04/2024) for f/u CPE.     Ginger Patrick, MSN, APRN, FNP-C [facility]        [1]  Current Outpatient Medications:    albuterol  (VENTOLIN  HFA) 108 (90 Base) MCG/ACT inhaler, Inhale 2  puffs into the lungs every 6 (six) hours as needed for wheezing or shortness of breath., Disp: 8 g, Rfl: 2   atenolol  (TENORMIN ) 25 MG tablet, Take 1 tablet (25 mg total) by mouth daily., Disp: , Rfl:    cetirizine (ZYRTEC) 10 MG chewable tablet, Chew 10 mg by mouth daily., Disp: , Rfl:    Cholecalciferol (VITAMIN D  PO), Take 5,000 Units by mouth daily. , Disp: , Rfl:    escitalopram  (LEXAPRO ) 20 MG tablet, Take  1 tablet  Daily  for Mood, Disp: 90 tablet, Rfl: 3   fluticasone -salmeterol (ADVAIR) 250-50 MCG/ACT AEPB, Use 1 Inhalation 2 x /day ( every 12 hours) for Allergic Asthma, Disp: 180 each, Rfl: 3   lactobacillus acidophilus (BACID) TABS tablet, Take 1 tablet by mouth daily., Disp: , Rfl:    meloxicam  (MOBIC ) 15 MG tablet, Take 1 tablet (15 mg total) by mouth daily. (Patient taking differently: Take 15 mg by mouth as needed.), Disp: 30 tablet, Rfl: 0  Multiple Vitamins-Minerals (ONE DAILY MULTIVITAMIN WOMEN PO), Take 1 tablet by mouth daily. With iron  and B vitamins, Disp: , Rfl:    rosuvastatin  (CRESTOR ) 20 MG tablet, Take 1 tablet (20 mg total) by mouth daily., Disp: , Rfl:    topiramate  (TOPAMAX ) 50 MG tablet, Take one tablet once daily, Disp: 90 tablet, Rfl: 1   traZODone  (DESYREL ) 50 MG tablet, Take 1 tablet (50 mg total) by mouth at bedtime as needed for sleep., Disp: , Rfl:   "

## 2024-06-07 ENCOUNTER — Ambulatory Visit: Payer: Self-pay | Admitting: Family

## 2024-06-07 LAB — MICROALBUMIN / CREATININE URINE RATIO
Creatinine,U: 78.6 mg/dL
Microalb Creat Ratio: UNDETERMINED mg/g (ref 0.0–30.0)
Microalb, Ur: <0.7 mg/dL

## 2024-06-09 ENCOUNTER — Encounter (INDEPENDENT_AMBULATORY_CARE_PROVIDER_SITE_OTHER): Payer: Self-pay | Admitting: Nurse Practitioner

## 2024-06-09 ENCOUNTER — Ambulatory Visit (INDEPENDENT_AMBULATORY_CARE_PROVIDER_SITE_OTHER): Admitting: Nurse Practitioner

## 2024-06-09 VITALS — BP 124/76 | HR 60 | Temp 98.0°F | Ht 62.0 in | Wt 223.0 lb

## 2024-06-09 DIAGNOSIS — E559 Vitamin D deficiency, unspecified: Secondary | ICD-10-CM | POA: Diagnosis not present

## 2024-06-09 DIAGNOSIS — E66813 Obesity, class 3: Secondary | ICD-10-CM

## 2024-06-09 DIAGNOSIS — I10 Essential (primary) hypertension: Secondary | ICD-10-CM

## 2024-06-09 DIAGNOSIS — Z6841 Body Mass Index (BMI) 40.0 and over, adult: Secondary | ICD-10-CM

## 2024-06-09 DIAGNOSIS — R7303 Prediabetes: Secondary | ICD-10-CM | POA: Diagnosis not present

## 2024-06-09 NOTE — Progress Notes (Signed)
 " Office: [phone]  /  Fax: [phone]  WEIGHT SUMMARY AND BIOMETRICS  Weight Lost Since Last Visit: 0  Weight Gained Since Last Visit: 0   Vitals Temp: 98 F (36.7 C) BP: 124/76 Pulse Rate: 60 SpO2: 96 %   Anthropometric Measurements Height: 5' 2 (1.575 m) Weight: 223 lb (101.2 kg) BMI (Calculated): 40.78 Weight at Last Visit: 223 lb Weight Lost Since Last Visit: 0 Weight Gained Since Last Visit: 0 Starting Weight: 233 lb Total Weight Loss (lbs): 10 lb (4.536 kg) Peak Weight: 235 lb   Body Composition  Body Fat %: 50.6 % Fat Mass (lbs): 113.2 lbs Muscle Mass (lbs): 104.8 lbs Total Body Water (lbs): 79.6 lbs Visceral Fat Rating : 17   Other Clinical Data Fasting: no Labs: no Today's Visit #: 6 Starting Date: 02/03/24    Total Weight Loss:10 pounds Bio Impedance Data reviewed with patient: Muscle is down 1 pound, adipose is up 1.6 pounds.  HPI  Chief Complaint: OBESITY  Whitney Herrera is here to discuss her progress with her obesity treatment plan. She is on the the Category 1 Plan and states she is following her eating plan approximately 50 % of the time. She states she is doing chair aerobics 2 days a week for 15-20 minutes   Interval History:  Since last office visit she has tried to use her sitting elliptical and walking more.  She has been trying to get 80 grams of protein but less over the holidays. She was at the beach over [place] and did skipped lunch. She is getting back on schedule for 3 meals daily.  She is trying to get more water-64 ounces daily.  She was recently seen by PCP- she has stopped her Phentermine  as she was not noticing a difference. She continue son Topamax - no side effects  Whitlee does have hypertension and her BP's are currently well controlled on Atenolol  25 mg every day. Denies headaches, chest pain shortness of breath at rest and dizziness  BP Readings from Last 3 Encounters:  06/09/24 124/76  06/06/24 126/82  05/11/24  122/71    Pharmacotherapy for weight loss: She is currently taking Topamax  for medical weight loss.  Denies side effects.      PHYSICAL EXAM:  Blood pressure 124/76, pulse 60, temperature 98 F (36.7 C), height 5' 2 (1.575 m), weight 223 lb (101.2 kg), SpO2 96%. Body mass index is 40.79 kg/m.  General: Well Developed, well nourished, and in no acute distress.  HEENT: Normocephalic, atraumatic; EOMI, sclerae are anicteric. Skin: Warm and dry, good turgor Chest:  Normal excursion, shape, no gross ABN Respiratory: No conversational dyspnea; speaking in full sentences NeuroM-Sk:  Normal gross ROM * 4 extremities  Psych: A and O X 3, insight adequate, mood- full    DIAGNOSTIC DATA REVIEWED:  BMET    Component Value Date/Time   NA 142 02/03/2024 1058   NA 141 05/10/2013 1441   K 4.2 02/03/2024 1058   K 4.1 05/10/2013 1441   CL 104 02/03/2024 1058   CO2 23 02/03/2024 1058   CO2 24 05/10/2013 1441   GLUCOSE 87 02/03/2024 1058   GLUCOSE 84 10/12/2023 1243   GLUCOSE 87 05/10/2013 1441   BUN 14 02/03/2024 1058   BUN 15.0 05/10/2013 1441   CREATININE 0.80 02/03/2024 1058   CREATININE 0.79 04/20/2023 1042   CREATININE 0.8 05/10/2013 1441   CALCIUM  9.0 02/03/2024 1058   CALCIUM  9.9 05/10/2013 1441   GFRNONAA >60 02/15/2021 1116   GFRNONAA  90 10/01/2020 1503   GFRAA 104 10/01/2020 1503   Lab Results  Component Value Date   HGBA1C 6.0 06/06/2024   HGBA1C 5.9 (H) 04/20/2013   Lab Results  Component Value Date   INSULIN  17.2 02/03/2024   Lab Results  Component Value Date   TSH 2.30 04/20/2023   CBC    Component Value Date/Time   WBC 5.8 10/12/2023 1243   RBC 4.82 10/12/2023 1243   HGB 13.7 10/12/2023 1243   HGB 12.2 02/15/2021 1116   HGB 13.7 05/10/2013 1441   HCT 42.9 10/12/2023 1243   HCT 41.4 05/10/2013 1441   PLT 209.0 10/12/2023 1243   PLT 226 02/15/2021 1116   PLT 187 05/10/2013 1441   MCV 89.1 10/12/2023 1243   MCV 89.3 05/10/2013 1441   MCH 26.7 (L)  04/20/2023 1042   MCHC 31.9 10/12/2023 1243   RDW 14.8 10/12/2023 1243   RDW 13.6 05/10/2013 1441   Iron  Studies    Component Value Date/Time   IRON  97 10/12/2023 1613   TIBC 452.2 (H) 10/12/2023 1613   FERRITIN 15.4 10/12/2023 1613   IRONPCTSAT 21.5 10/12/2023 1613   IRONPCTSAT 18 09/22/2022 1141   Lipid Panel     Component Value Date/Time   CHOL 159 06/06/2024 1257   TRIG 224.0 (H) 06/06/2024 1257   HDL 52.00 06/06/2024 1257   CHOLHDL 3 06/06/2024 1257   VLDL 44.8 (H) 06/06/2024 1257   LDLCALC 62 06/06/2024 1257   LDLCALC 75 04/20/2023 1042   Hepatic Function Panel     Component Value Date/Time   PROT 7.1 06/06/2024 1257   PROT 7.6 05/10/2013 1441   ALBUMIN 4.2 06/06/2024 1257   ALBUMIN 4.1 05/10/2013 1441   AST 16 06/06/2024 1257   AST 15 02/15/2021 1116   AST 17 05/10/2013 1441   ALT 13 06/06/2024 1257   ALT 13 02/15/2021 1116   ALT 18 05/10/2013 1441   ALKPHOS 69 06/06/2024 1257   ALKPHOS 73 05/10/2013 1441   BILITOT 0.3 06/06/2024 1257   BILITOT 0.4 02/15/2021 1116   BILITOT 0.33 05/10/2013 1441   BILIDIR 0.1 06/06/2024 1257   IBILI 0.3 12/06/2019 1112      Component Value Date/Time   TSH 2.30 04/20/2023 1042   Nutritional Lab Results  Component Value Date   VD25OH 49.8 02/03/2024   VD25OH 38.86 10/12/2023   VD25OH 57 04/20/2023     ASSESSMENT AND PLAN Class 3 severe obesity with serious comorbidity and body mass index (BMI) of 40.0 to 44.9 in adult, unspecified obesity type (HCC) TREATMENT PLAN FOR OBESITY:  Recommended Dietary Goals  Tylah is currently in the action stage of change. As such, her goal is to continue weight management plan. She has agreed to the Category 1 Plan.  Behavioral Intervention  We discussed the following Behavioral Modification Strategies today: work on meal planning and preparation, continue to work on implementation of reduced calorie nutritional plan, continue to work on maintaining a reduced calorie state,  getting the recommended amount of protein, incorporating whole foods, making healthy choices, staying well hydrated and practicing mindfulness when eating., and increase protein intake, fibrous foods (25 grams per day for women, 30 grams for men) and water to improve satiety and decrease hunger signals. .  Reviewed CHatGPT for 5 ingredient high protein recipes  Recommended Physical Activity Goals  Liisa has been advised to work up to 150 minutes of moderate intensity aerobic activity a week and strengthening exercises 2-3 times per week for cardiovascular health,  weight loss maintenance and preservation of muscle mass.   She has agreed to Increase volume of physical activity and Combine aerobic and strengthening exercises for efficiency and improved cardiometabolic health. She is doing irregularly currently- recommend 3 days a week of sitting elliptical as next goal   Pharmacotherapy We discussed various medication options to help Denai with her weight loss efforts and we both agreed to continue nutrition and behavior modification.  ASSOCIATED CONDITIONS ADDRESSED TODAY  Action/Plan  Essential hypertension Continue Atenolol  25 mg every day Continue Category 1 meal plan  and DASH diet Monitor BP and if consistently >140/90 notify PCP If develops headaches, chest pain, shortness of breath or dizziness go to ER Continue to follow regularly with PCP  Prediabetes Continue Category 1  meal plan, limit simple carbohydrates. Increase lean protein, water and fiber Continue to follow regularly with PCP Continue exercise with current goal of 3 days a week of sitting elliptical and increase walking  Vitamin D  deficiency       Low vitamin D  levels can be associated with adiposity and may result in leptin resistance and weight gain. Also associated with fatigue.        Currently on vitamin D  supplementation without any adverse effects such as nausea, vomiting or muscle weakness.             Return in about 4 weeks (around 07/07/2024).SABRA She was informed of the importance of frequent follow up visits to maximize her success with intensive lifestyle modifications for her multiple health conditions.   ATTESTASTION STATEMENTS:  Reviewed by clinician on day of visit: allergies, medications, problem list, medical history, surgical history, family history, social history, and previous encounter notes.   I personally spent a total of 20 minutes in the care of the patient today including preparing to see the patient, getting/reviewing separately obtained history, performing a medically appropriate exam/evaluation, counseling and educating, and documenting clinical information in the EHR.   Lonell Liverpool ANP-C "

## 2024-07-05 ENCOUNTER — Ambulatory Visit: Payer: Self-pay

## 2024-07-05 NOTE — Telephone Encounter (Signed)
 Noted, will evaluate.

## 2024-07-05 NOTE — Telephone Encounter (Signed)
 Patient has appt with Mallie Gaskins, NP tomorrow

## 2024-07-05 NOTE — Telephone Encounter (Signed)
 NOTED

## 2024-07-05 NOTE — Telephone Encounter (Signed)
 FYI Only or Action Required?: FYI only for provider: appointment scheduled on 07/06/24.  Patient was last seen in primary care on 06/09/2024 by Wilkinson, Dana E, NP.  Called Nurse Triage reporting Eye Problem.  Symptoms began a week ago.  Interventions attempted: OTC medications: visine and Ice/heat application.  Symptoms are: unchanged.  Triage Disposition: See Physician Within 24 Hours  Patient/caregiver understands and will follow disposition?: Yes   Message from [place] J sent at 07/05/2024 10:24 AM EST  Reason for Triage: think sh have pink eye, eye watery, mucus and red, itch, scratchy, pain and discomfort.    Reason for Disposition  Yellow or green pus occurs  Answer Assessment - Initial Assessment Questions Scheduled 07/06/24 Advised UC today or ED/911 if symptoms worsen: severe eye pain, dizziness, HA, changes to vision. Patient verbalized understanding.    1. ONSET: When did the pain start? (e.g., minutes, hours, days)     Friday;  schlera redness, crusting, watery eyes; denies redness, swelling 2. TIMING: Does the pain come and go, or has it been constant since it started? (e.g., constant, intermittent, fleeting)    Comes and goes 3. SEVERITY: How bad is the pain?  (Scale 1-10; mild, moderate or severe)     4/10; warm compress, Visine 4. LOCATION: Where does it hurt?  (e.g., eyelid, eye, cheekbone)     Right eye 5. CAUSE: What do you think is causing the pain?     unsure 6. VISION: Do you have blurred vision or changes in your vision?      denies 7. EYE DISCHARGE: Is there any discharge (pus) from the eye(s)?  If Yes, ask: What color is it?      Yellow, crusting 8. FEVER: Do you have a fever? If Yes, ask: What is it, how was it measured, and when did it start?      Denies fever chills,n/v 9. OTHER SYMPTOMS: Do you have any other symptoms? (e.g., headache, nasal discharge, facial rash)  Denies HA, blurred vision, redness, swelling  Protocols  used: Eye Pain and Other Symptoms-A-AH

## 2024-07-06 ENCOUNTER — Encounter: Payer: Self-pay | Admitting: Primary Care

## 2024-07-06 ENCOUNTER — Ambulatory Visit: Admitting: Primary Care

## 2024-07-06 VITALS — BP 126/68 | HR 55 | Temp 98.2°F | Ht 62.0 in | Wt 228.5 lb

## 2024-07-06 DIAGNOSIS — H1031 Unspecified acute conjunctivitis, right eye: Secondary | ICD-10-CM | POA: Diagnosis not present

## 2024-07-06 DIAGNOSIS — H109 Unspecified conjunctivitis: Secondary | ICD-10-CM | POA: Insufficient documentation

## 2024-07-06 MED ORDER — POLYMYXIN B-TRIMETHOPRIM 10000-0.1 UNIT/ML-% OP SOLN: 1.0000 [drp] | Freq: Four times a day (QID) | OPHTHALMIC | 0 refills | Status: AC

## 2024-07-06 NOTE — Patient Instructions (Addendum)
 Start trimethoprim -polymyxin B  ophthalmic drops.  1 drop to right eye every 6 hours while awake for 5 to 7 days.  Use to left eye only if you develop symptoms.  Be sure to complete the entire course.  It was a pleasure meeting you!

## 2024-07-06 NOTE — Assessment & Plan Note (Signed)
 Exam today suggestive of bacterial involvement, especially given little to no improvement after 1 week. We did discuss that her symptoms could be allergic conjunctivitis.  Start trimethoprim -polymyxin B  ophthalmic drops.  1 drop to right eye every 6 hours while awake for 5 to 7 days. We discussed the importance of completing the entire course of treatment.  We also discussed that if symptoms have not improved in a few days then her symptoms could be secondary to allergic conjunctivitis.  At that point try antihistamine drops.  Follow-up as needed.

## 2024-07-06 NOTE — Progress Notes (Signed)
 "  Subjective:    Patient ID: Whitney Herrera, female    DOB: 1955-11-28, 69 y.o.   MRN: [id number]  Whitney Herrera is a very pleasant 70 y.o. female patient of Tabitha, NP with a history of hypertension, reactive airway disease, hyperlipidemia, Sjogren syndrome, iron  deficiency anemia, prediabetes who presents today to discuss eye irritation  Symptom onset 6 days ago with itching, redness, and watering to the right eye. Since then she's noticed whitish discharge. Also has noticed her eyelids are more crusty and matted. Yesterday her symptoms were worse due to pain.   She's tried Visine drops without improvement. She denies fevers, cough, increased post nasal drip, sick contacts.  She has been compliant to Zyrtec daily.   Review of Systems  Constitutional:  Negative for fever.  HENT:  Positive for postnasal drip. Negative for congestion.   Eyes:  Positive for pain, discharge, redness and itching. Negative for visual disturbance.  Respiratory:  Negative for cough.          Past Medical History:  Diagnosis Date   Anemia    Arthritis    Asthma    GERD (gastroesophageal reflux disease)    Heart burn    Heart murmur    Hiatal hernia    Hyperlipidemia    Hypertension    Joint pain    Obesity    Palpitations    Prediabetes    Sjogren syndrome    Vitamin D  deficiency     Social History   Socioeconomic History   Marital status: Married    Spouse name: Ayushi Pla   Number of children: 2   Years of education: Not on file   Highest education level: Bachelor's degree (e.g., BA, AB, BS)  Occupational History   Occupation: retired  Tobacco Use   Smoking status: Never    Passive exposure: Past   Smokeless tobacco: Never  Vaping Use   Vaping status: Never Used  Substance and Sexual Activity   Alcohol use: Yes    Alcohol/week: 2.0 standard drinks of alcohol    Types: 2 Glasses of wine per week    Comment: wine occasionally   Drug use: No   Sexual activity: Not Currently     Partners: Male    Birth control/protection: Post-menopausal  Other Topics Concern   Not on file  Social History Narrative   Not on file   Social Drivers of Health   Tobacco Use: Low Risk (07/06/2024)   Patient History    Smoking Tobacco Use: Never    Smokeless Tobacco Use: Never    Passive Exposure: Past  Financial Resource Strain: Low Risk (03/25/2024)   Overall Financial Resource Strain (CARDIA)    Difficulty of Paying Living Expenses: Not hard at all  Food Insecurity: No Food Insecurity (03/25/2024)   Epic    Worried About Radiation Protection Practitioner of Food in the Last Year: Never true    Ran Out of Food in the Last Year: Never true  Transportation Needs: No Transportation Needs (03/25/2024)   Epic    Lack of Transportation (Medical): No    Lack of Transportation (Non-Medical): No  Physical Activity: Unknown (03/25/2024)   Exercise Vital Sign    Days of Exercise per Week: Patient declined    Minutes of Exercise per Session: Not on file  Stress: No Stress Concern Present (03/25/2024)   [employer] of Occupational Health - Occupational Stress Questionnaire    Feeling of Stress: Not at all  Social Connections: Socially Integrated (03/25/2024)  Social Connection and Isolation Panel    Frequency of Communication with Friends and Family: More than three times a week    Frequency of Social Gatherings with Friends and Family: Three times a week    Attends Religious Services: More than 4 times per year    Active Member of Clubs or Organizations: Yes    Attends Banker Meetings: More than 4 times per year    Marital Status: Married  Catering Manager Violence: Not At Risk (03/29/2024)   Epic    Fear of Current or Ex-Partner: No    Emotionally Abused: No    Physically Abused: No    Sexually Abused: No  Depression (PHQ2-9): Low Risk (03/29/2024)   Depression (PHQ2-9)    PHQ-2 Score: 0  Recent Concern: Depression (PHQ2-9) - Medium Risk (02/03/2024)   Depression (PHQ2-9)     PHQ-2 Score: 6  Alcohol Screen: Low Risk (03/25/2024)   Alcohol Screen    Last Alcohol Screening Score (AUDIT): 2  Housing: Unknown (03/25/2024)   Epic    Unable to Pay for Housing in the Last Year: Patient declined    Number of Times Moved in the Last Year: Not on file    Homeless in the Last Year: No  Utilities: Not At Risk (03/29/2024)   Epic    Threatened with loss of utilities: No  Health Literacy: Adequate Health Literacy (03/29/2024)   B1300 Health Literacy    Frequency of need for help with medical instructions: Never    Past Surgical History:  Procedure Laterality Date   APPENDECTOMY     BREAST BIOPSY Right 06/02/1973   COLONOSCOPY     INGUINAL HERNIA REPAIR Right age 7   UPPER GASTROINTESTINAL ENDOSCOPY      Family History  Problem Relation Age of Onset   Hypertension Mother    Alzheimer's disease Mother    Varicose Veins Mother    Hypertension Father    Alzheimer's disease Father    Kidney disease Sister    ADD / ADHD Daughter    ADD / ADHD Son    Colon cancer Neg Hx    Rectal cancer Neg Hx    Stomach cancer Neg Hx    Esophageal cancer Neg Hx     Allergies[1]  Medications Ordered Prior to Encounter[2]  BP 126/68   Pulse (!) 55   Temp 98.2 F (36.8 C) (Oral)   Ht 5' 2 (1.575 m)   Wt 228 lb 8 oz (103.6 kg)   SpO2 95%   BMI 41.79 kg/m  Objective:   Physical Exam Constitutional:      Appearance: She is not ill-appearing.  Eyes:     General: Lids are normal.        Right eye: No foreign body or discharge.        Left eye: No foreign body or discharge.     Conjunctiva/sclera:     Right eye: Right conjunctiva is injected. No exudate or hemorrhage.    Left eye: Left conjunctiva is not injected. No exudate or hemorrhage. Neurological:     Mental Status: She is alert.     Physical Exam        Assessment & Plan:  Acute bacterial conjunctivitis of right eye Assessment & Plan: Exam today suggestive of bacterial involvement, especially  given little to no improvement after 1 week. We did discuss that her symptoms could be allergic conjunctivitis.  Start trimethoprim -polymyxin B  ophthalmic drops.  1 drop to right eye every 6  hours while awake for 5 to 7 days. We discussed the importance of completing the entire course of treatment.  We also discussed that if symptoms have not improved in a few days then her symptoms could be secondary to allergic conjunctivitis.  At that point try antihistamine drops.  Follow-up as needed.  Orders: -     Polymyxin B -Trimethoprim ; Place 1 drop into the right eye every 6 (six) hours. For 5-7 days.  Dispense: 10 mL; Refill: 0    Assessment and Plan Assessment & Plan         [place] MARLA Gaskins, NP       [1]  Allergies Allergen Reactions   Lipitor [Atorvastatin] Other (See Comments)    Thinks was myalgias   Wellbutrin [Bupropion] Other (See Comments)    dysphoria  [2]  Current Outpatient Medications on File Prior to Visit  Medication Sig Dispense Refill   albuterol  (VENTOLIN  HFA) 108 (90 Base) MCG/ACT inhaler Inhale 2 puffs into the lungs every 6 (six) hours as needed for wheezing or shortness of breath. 8 g 2   atenolol  (TENORMIN ) 25 MG tablet Take 1 tablet (25 mg total) by mouth daily.     cetirizine (ZYRTEC) 10 MG chewable tablet Chew 10 mg by mouth daily.     Cholecalciferol (VITAMIN D  PO) Take 5,000 Units by mouth daily.      escitalopram  (LEXAPRO ) 20 MG tablet Take  1 tablet  Daily  for Mood 90 tablet 3   fluticasone -salmeterol (ADVAIR) 250-50 MCG/ACT AEPB Use 1 Inhalation 2 x /day ( every 12 hours) for Allergic Asthma 180 each 3   lactobacillus acidophilus (BACID) TABS tablet Take 1 tablet by mouth daily.     meloxicam  (MOBIC ) 15 MG tablet Take 1 tablet (15 mg total) by mouth daily. 30 tablet 0   Multiple Vitamins-Minerals (ONE DAILY MULTIVITAMIN WOMEN PO) Take 1 tablet by mouth daily. With iron  and B vitamins     rosuvastatin  (CRESTOR ) 20 MG tablet Take 1 tablet  (20 mg total) by mouth daily.     topiramate  (TOPAMAX ) 50 MG tablet Take one tablet once daily 90 tablet 1   traZODone  (DESYREL ) 50 MG tablet Take 1 tablet (50 mg total) by mouth at bedtime as needed for sleep.     No current facility-administered medications on file prior to visit.   "

## 2024-07-07 ENCOUNTER — Ambulatory Visit (INDEPENDENT_AMBULATORY_CARE_PROVIDER_SITE_OTHER): Admitting: Nurse Practitioner

## 2024-07-26 ENCOUNTER — Ambulatory Visit (INDEPENDENT_AMBULATORY_CARE_PROVIDER_SITE_OTHER): Admitting: Nurse Practitioner

## 2024-10-12 ENCOUNTER — Encounter: Admitting: Family

## 2024-11-09 ENCOUNTER — Ambulatory Visit: Admitting: Rheumatology

## 2025-03-31 ENCOUNTER — Ambulatory Visit
